# Patient Record
Sex: Female | Born: 1935
Health system: Southern US, Community
[De-identification: ages and names within clinical notes are randomized; demographics above are authoritative.]

## PROBLEM LIST (undated history)

## (undated) DIAGNOSIS — J449 Chronic obstructive pulmonary disease, unspecified: Secondary | ICD-10-CM

## (undated) DIAGNOSIS — I252 Old myocardial infarction: Secondary | ICD-10-CM

## (undated) DIAGNOSIS — F419 Anxiety disorder, unspecified: Secondary | ICD-10-CM

## (undated) DIAGNOSIS — J984 Other disorders of lung: Secondary | ICD-10-CM

## (undated) DIAGNOSIS — R600 Localized edema: Secondary | ICD-10-CM

## (undated) DIAGNOSIS — R0602 Shortness of breath: Secondary | ICD-10-CM

## (undated) DIAGNOSIS — I219 Acute myocardial infarction, unspecified: Secondary | ICD-10-CM

## (undated) DIAGNOSIS — K219 Gastro-esophageal reflux disease without esophagitis: Secondary | ICD-10-CM

## (undated) DIAGNOSIS — N83209 Unspecified ovarian cyst, unspecified side: Secondary | ICD-10-CM

## (undated) DIAGNOSIS — C801 Malignant (primary) neoplasm, unspecified: Secondary | ICD-10-CM

## (undated) DIAGNOSIS — E785 Hyperlipidemia, unspecified: Secondary | ICD-10-CM

## (undated) DIAGNOSIS — Z9221 Personal history of antineoplastic chemotherapy: Secondary | ICD-10-CM

## (undated) DIAGNOSIS — I2510 Atherosclerotic heart disease of native coronary artery without angina pectoris: Secondary | ICD-10-CM

## (undated) HISTORY — DX: Other disorders of lung: J98.4

## (undated) HISTORY — DX: Malignant (primary) neoplasm, unspecified: C80.1

## (undated) HISTORY — DX: Shortness of breath: R06.02

## (undated) HISTORY — DX: Old myocardial infarction: I25.2

## (undated) HISTORY — DX: Unspecified ovarian cyst, unspecified side: N83.209

## (undated) HISTORY — DX: Gastro-esophageal reflux disease without esophagitis: K21.9

## (undated) HISTORY — DX: Hyperlipidemia, unspecified: E78.5

## (undated) HISTORY — PX: TONSILLECTOMY: SUR1361

---

## 2004-01-16 ENCOUNTER — Other Ambulatory Visit: Payer: Self-pay

## 2004-01-16 ENCOUNTER — Inpatient Hospital Stay: Payer: Self-pay | Admitting: Internal Medicine

## 2004-02-04 ENCOUNTER — Ambulatory Visit: Payer: Self-pay | Admitting: Internal Medicine

## 2004-02-14 ENCOUNTER — Ambulatory Visit: Payer: Self-pay | Admitting: Internal Medicine

## 2004-03-13 ENCOUNTER — Ambulatory Visit: Payer: Self-pay

## 2004-03-19 ENCOUNTER — Encounter: Payer: Self-pay | Admitting: Pulmonary Disease

## 2004-03-19 ENCOUNTER — Ambulatory Visit: Payer: Self-pay

## 2004-04-06 ENCOUNTER — Ambulatory Visit (HOSPITAL_COMMUNITY): Admission: RE | Admit: 2004-04-06 | Discharge: 2004-04-07 | Payer: Self-pay | Admitting: Cardiothoracic Surgery

## 2004-04-06 ENCOUNTER — Encounter (INDEPENDENT_AMBULATORY_CARE_PROVIDER_SITE_OTHER): Payer: Self-pay | Admitting: *Deleted

## 2004-04-14 ENCOUNTER — Inpatient Hospital Stay (HOSPITAL_COMMUNITY): Admission: RE | Admit: 2004-04-14 | Discharge: 2004-04-19 | Payer: Self-pay | Admitting: Cardiothoracic Surgery

## 2004-04-14 ENCOUNTER — Encounter (INDEPENDENT_AMBULATORY_CARE_PROVIDER_SITE_OTHER): Payer: Self-pay | Admitting: Specialist

## 2004-05-01 ENCOUNTER — Ambulatory Visit: Payer: Self-pay | Admitting: Oncology

## 2004-05-08 ENCOUNTER — Encounter: Admission: RE | Admit: 2004-05-08 | Discharge: 2004-05-08 | Payer: Self-pay | Admitting: Cardiothoracic Surgery

## 2004-05-16 ENCOUNTER — Ambulatory Visit: Payer: Self-pay | Admitting: Oncology

## 2004-06-15 ENCOUNTER — Ambulatory Visit: Payer: Self-pay | Admitting: Oncology

## 2004-06-19 ENCOUNTER — Encounter: Admission: RE | Admit: 2004-06-19 | Discharge: 2004-06-19 | Payer: Self-pay | Admitting: Cardiothoracic Surgery

## 2004-07-16 ENCOUNTER — Ambulatory Visit: Payer: Self-pay | Admitting: Oncology

## 2004-08-15 ENCOUNTER — Ambulatory Visit: Payer: Self-pay | Admitting: Oncology

## 2004-09-15 ENCOUNTER — Ambulatory Visit: Payer: Self-pay | Admitting: Oncology

## 2004-12-14 ENCOUNTER — Ambulatory Visit: Payer: Self-pay | Admitting: Oncology

## 2004-12-16 ENCOUNTER — Ambulatory Visit: Payer: Self-pay | Admitting: Oncology

## 2005-02-15 HISTORY — PX: LUNG SURGERY: SHX703

## 2005-03-05 ENCOUNTER — Ambulatory Visit: Payer: Self-pay | Admitting: Oncology

## 2005-03-09 ENCOUNTER — Ambulatory Visit: Payer: Self-pay

## 2005-03-10 ENCOUNTER — Ambulatory Visit: Payer: Self-pay | Admitting: Oncology

## 2005-06-08 ENCOUNTER — Ambulatory Visit: Payer: Self-pay | Admitting: Oncology

## 2005-06-15 ENCOUNTER — Ambulatory Visit: Payer: Self-pay | Admitting: Oncology

## 2005-09-10 ENCOUNTER — Ambulatory Visit: Payer: Self-pay | Admitting: Oncology

## 2005-09-14 ENCOUNTER — Ambulatory Visit: Payer: Self-pay | Admitting: Oncology

## 2005-12-14 ENCOUNTER — Ambulatory Visit: Payer: Self-pay | Admitting: Oncology

## 2005-12-16 ENCOUNTER — Ambulatory Visit: Payer: Self-pay | Admitting: Oncology

## 2006-03-04 ENCOUNTER — Ambulatory Visit: Payer: Self-pay | Admitting: Oncology

## 2006-03-09 ENCOUNTER — Ambulatory Visit: Payer: Self-pay | Admitting: Oncology

## 2006-03-24 ENCOUNTER — Ambulatory Visit: Payer: Self-pay | Admitting: Oncology

## 2006-03-29 ENCOUNTER — Ambulatory Visit: Payer: Self-pay | Admitting: Gastroenterology

## 2006-06-16 ENCOUNTER — Ambulatory Visit: Payer: Self-pay | Admitting: Oncology

## 2006-07-07 ENCOUNTER — Ambulatory Visit: Payer: Self-pay | Admitting: Oncology

## 2006-07-17 ENCOUNTER — Ambulatory Visit: Payer: Self-pay | Admitting: Oncology

## 2007-01-11 ENCOUNTER — Ambulatory Visit: Payer: Self-pay | Admitting: Oncology

## 2007-01-13 IMAGING — CR DG CHEST 2V
2 series · 2 of 2 positions shown · non-contrast
Comparison: 04/18/2004

CLINICAL DATA: Lung lesion, status post surgery

CHEST - 2 VIEW:

[view not recorded (1 of 2)]
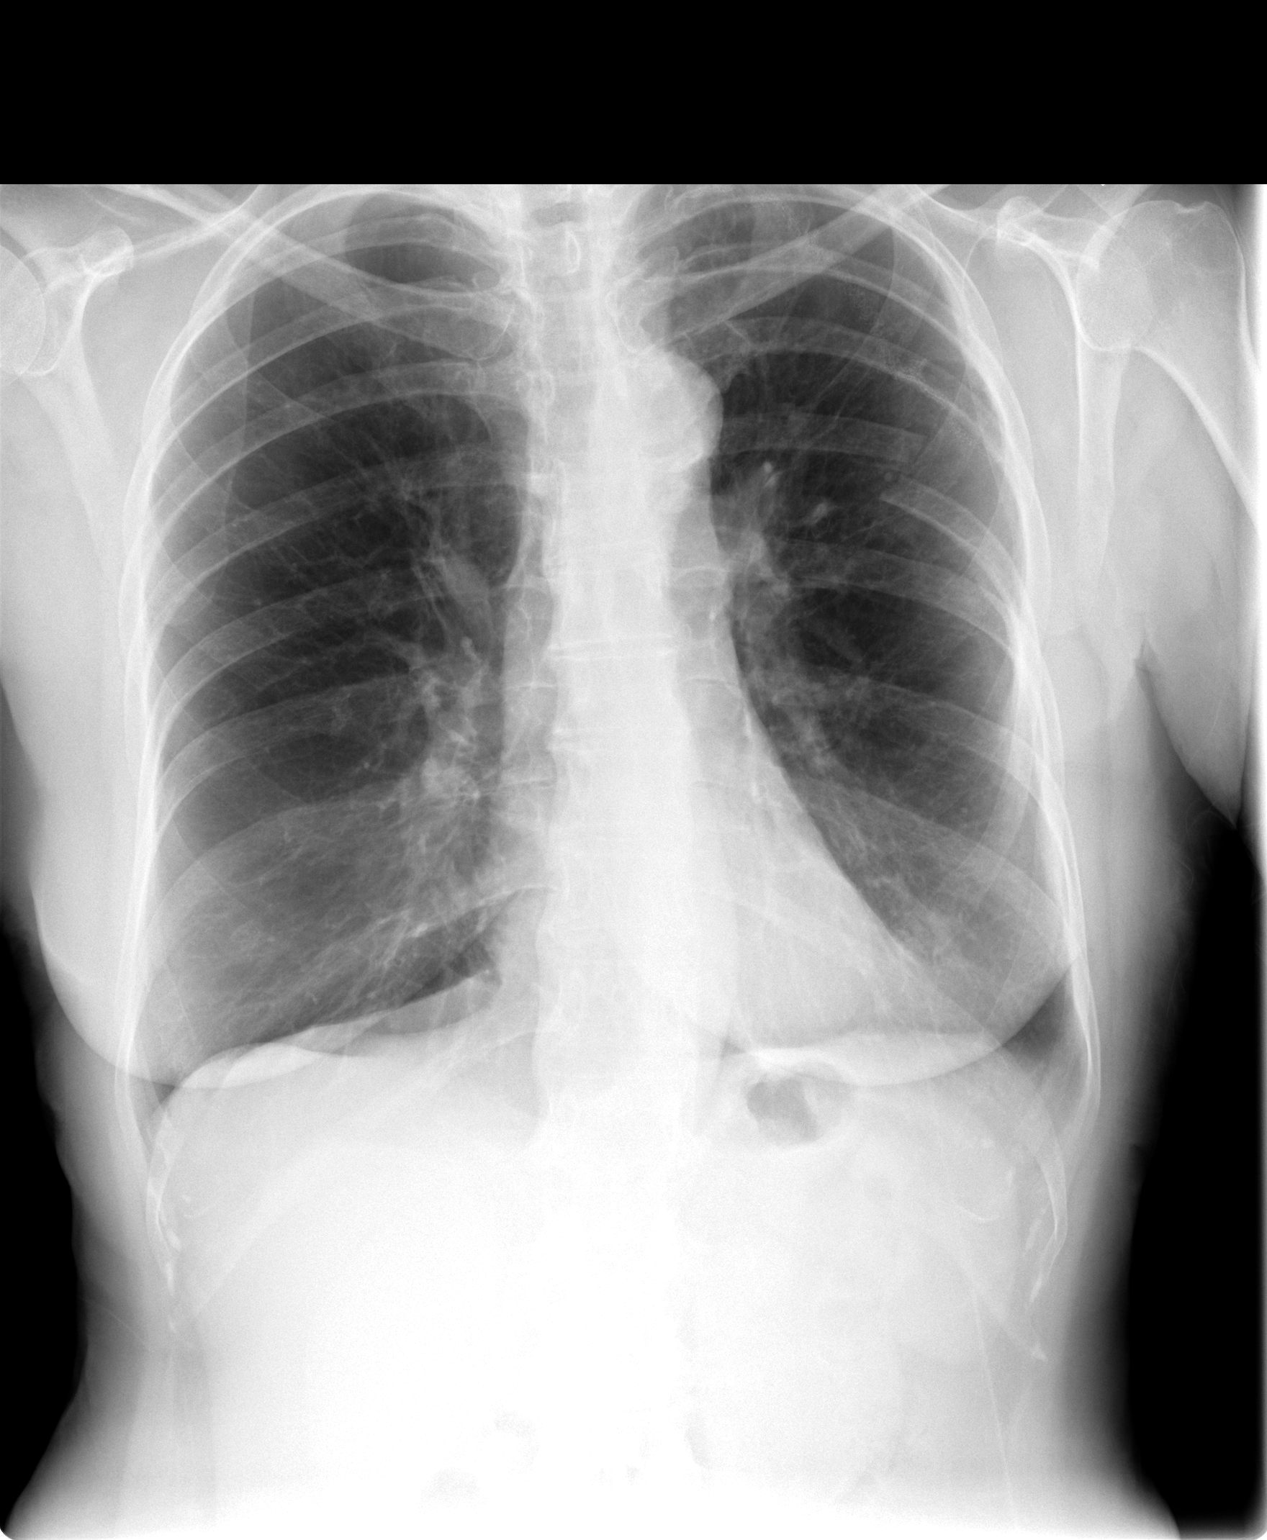

[view not recorded (2 of 2)]
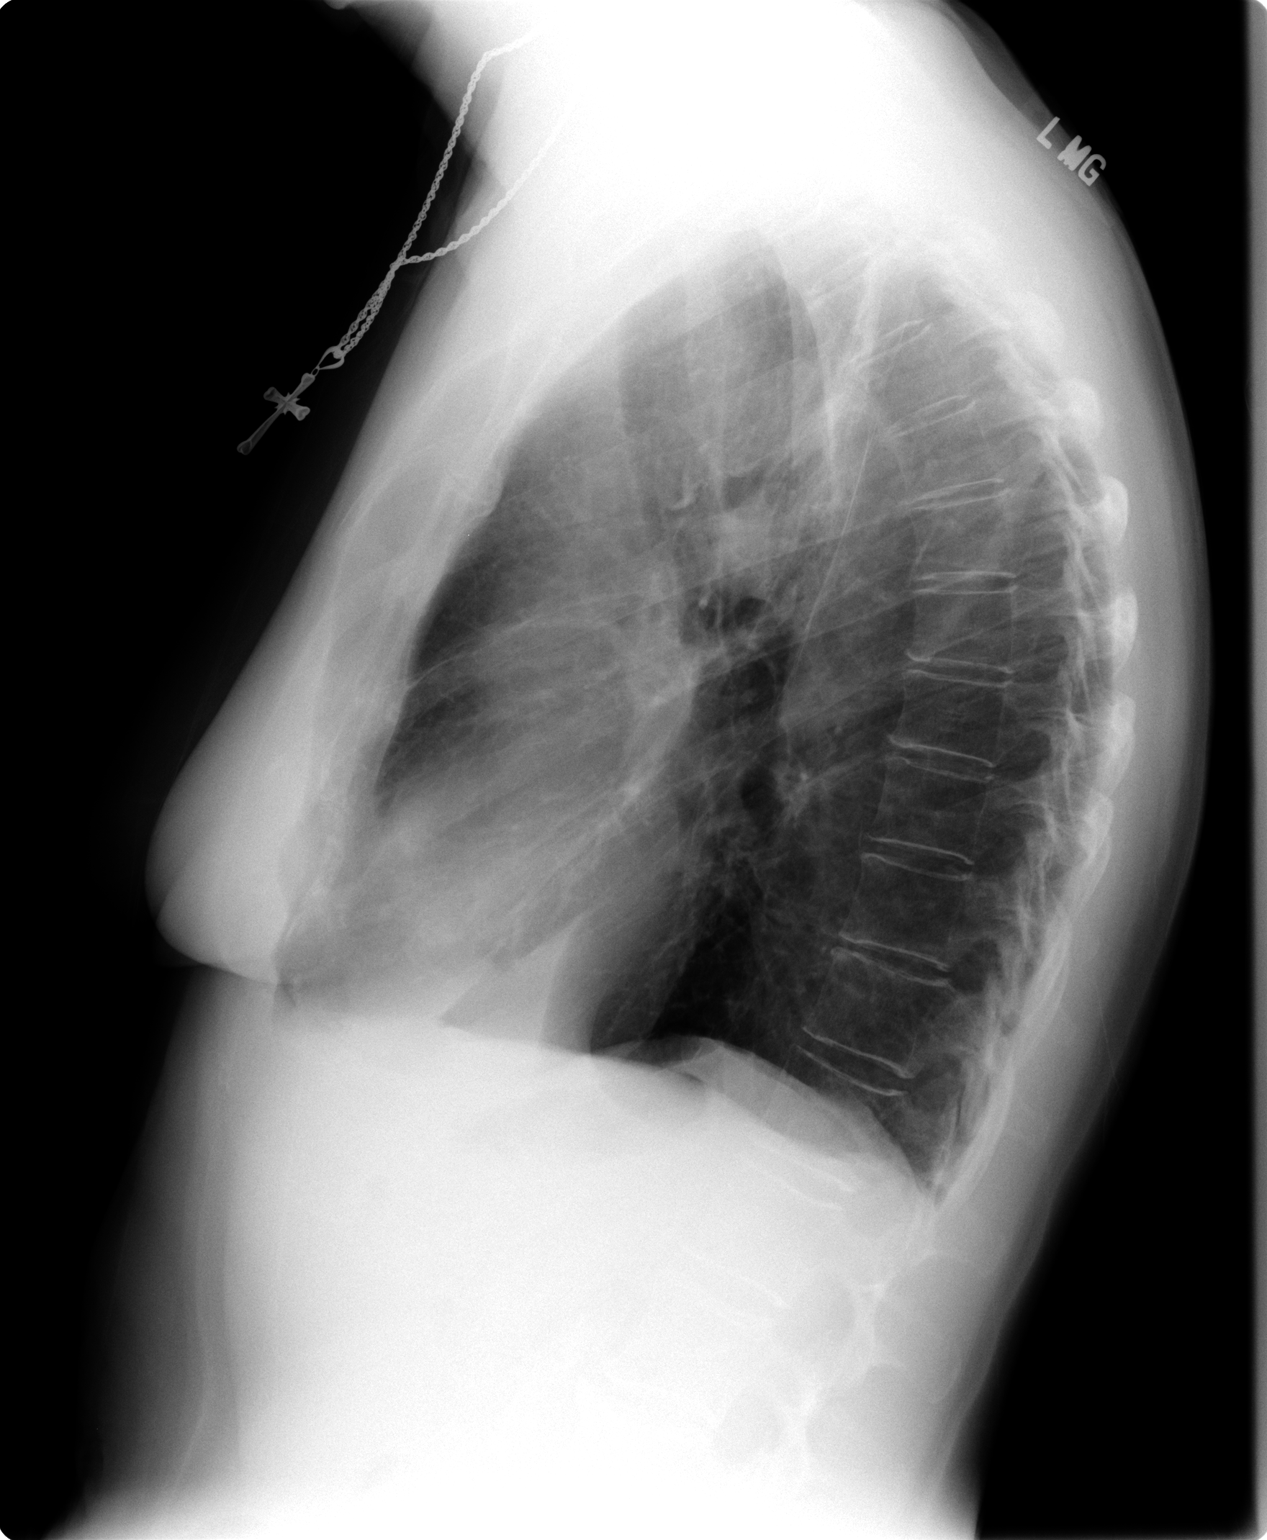

[2 of 2 positions shown; findings below may reference images not displayed]

FINDINGS: Postoperative changes noted on the left. Previously described small
left apical pneumothorax no longer visualized. Left effusion has decreased. COPD
changes present.
IMPRESSION: Resolution of left hydropneumothorax.

COPD

## 2007-01-16 ENCOUNTER — Ambulatory Visit: Payer: Self-pay | Admitting: Oncology

## 2007-02-16 ENCOUNTER — Ambulatory Visit: Payer: Self-pay | Admitting: Oncology

## 2007-03-27 ENCOUNTER — Ambulatory Visit: Payer: Self-pay | Admitting: Oncology

## 2007-04-17 ENCOUNTER — Ambulatory Visit: Payer: Self-pay | Admitting: Oncology

## 2007-04-19 ENCOUNTER — Ambulatory Visit: Payer: Self-pay | Admitting: Oncology

## 2007-04-20 ENCOUNTER — Ambulatory Visit: Payer: Self-pay | Admitting: Oncology

## 2007-04-25 ENCOUNTER — Encounter: Payer: Self-pay | Admitting: Pulmonary Disease

## 2007-04-27 ENCOUNTER — Encounter: Payer: Self-pay | Admitting: Pulmonary Disease

## 2007-05-01 ENCOUNTER — Encounter: Payer: Self-pay | Admitting: Pulmonary Disease

## 2007-05-17 ENCOUNTER — Ambulatory Visit: Payer: Self-pay | Admitting: Oncology

## 2007-06-01 ENCOUNTER — Ambulatory Visit: Payer: Self-pay | Admitting: Pulmonary Disease

## 2007-06-01 DIAGNOSIS — J309 Allergic rhinitis, unspecified: Secondary | ICD-10-CM | POA: Insufficient documentation

## 2007-06-01 DIAGNOSIS — R0602 Shortness of breath: Secondary | ICD-10-CM | POA: Insufficient documentation

## 2007-06-01 DIAGNOSIS — J45909 Unspecified asthma, uncomplicated: Secondary | ICD-10-CM | POA: Insufficient documentation

## 2007-06-01 DIAGNOSIS — J438 Other emphysema: Secondary | ICD-10-CM | POA: Insufficient documentation

## 2007-06-15 ENCOUNTER — Ambulatory Visit: Payer: Self-pay | Admitting: Pulmonary Disease

## 2007-06-16 ENCOUNTER — Ambulatory Visit: Payer: Self-pay | Admitting: Oncology

## 2007-07-05 ENCOUNTER — Encounter: Payer: Self-pay | Admitting: Pulmonary Disease

## 2007-07-17 ENCOUNTER — Ambulatory Visit: Payer: Self-pay | Admitting: Oncology

## 2007-08-03 ENCOUNTER — Ambulatory Visit: Payer: Self-pay | Admitting: Oncology

## 2007-08-16 ENCOUNTER — Ambulatory Visit: Payer: Self-pay | Admitting: Oncology

## 2007-10-17 ENCOUNTER — Ambulatory Visit: Payer: Self-pay | Admitting: Oncology

## 2007-11-01 ENCOUNTER — Ambulatory Visit: Payer: Self-pay | Admitting: Oncology

## 2007-11-03 ENCOUNTER — Ambulatory Visit: Payer: Self-pay | Admitting: Oncology

## 2007-11-16 ENCOUNTER — Ambulatory Visit: Payer: Self-pay | Admitting: Oncology

## 2007-11-16 ENCOUNTER — Inpatient Hospital Stay: Payer: Self-pay | Admitting: Internal Medicine

## 2007-11-16 ENCOUNTER — Other Ambulatory Visit: Payer: Self-pay

## 2007-11-18 ENCOUNTER — Inpatient Hospital Stay (HOSPITAL_COMMUNITY): Admission: AD | Admit: 2007-11-18 | Discharge: 2007-11-21 | Payer: Self-pay | Admitting: Cardiology

## 2007-11-18 ENCOUNTER — Ambulatory Visit: Payer: Self-pay | Admitting: Emergency Medicine

## 2007-12-17 ENCOUNTER — Ambulatory Visit: Payer: Self-pay | Admitting: Oncology

## 2008-02-16 ENCOUNTER — Ambulatory Visit: Payer: Self-pay | Admitting: Oncology

## 2008-02-26 ENCOUNTER — Ambulatory Visit: Payer: Self-pay | Admitting: Oncology

## 2008-03-18 ENCOUNTER — Ambulatory Visit: Payer: Self-pay | Admitting: Oncology

## 2008-04-05 ENCOUNTER — Ambulatory Visit: Payer: Self-pay | Admitting: Internal Medicine

## 2008-04-15 ENCOUNTER — Ambulatory Visit: Payer: Self-pay | Admitting: Oncology

## 2008-06-15 ENCOUNTER — Ambulatory Visit: Payer: Self-pay | Admitting: Oncology

## 2008-07-09 ENCOUNTER — Ambulatory Visit: Payer: Self-pay | Admitting: Internal Medicine

## 2008-07-12 ENCOUNTER — Ambulatory Visit: Payer: Self-pay | Admitting: Oncology

## 2008-07-16 ENCOUNTER — Ambulatory Visit: Payer: Self-pay | Admitting: Oncology

## 2008-08-15 ENCOUNTER — Ambulatory Visit: Payer: Self-pay | Admitting: Oncology

## 2008-09-15 ENCOUNTER — Ambulatory Visit: Payer: Self-pay | Admitting: Oncology

## 2009-01-20 ENCOUNTER — Ambulatory Visit: Payer: Self-pay | Admitting: Oncology

## 2009-02-15 ENCOUNTER — Ambulatory Visit: Payer: Self-pay | Admitting: Oncology

## 2009-03-18 ENCOUNTER — Ambulatory Visit: Payer: Self-pay | Admitting: Oncology

## 2009-04-15 ENCOUNTER — Ambulatory Visit: Payer: Self-pay | Admitting: Oncology

## 2009-06-24 ENCOUNTER — Ambulatory Visit: Payer: Self-pay | Admitting: Obstetrics and Gynecology

## 2009-08-15 ENCOUNTER — Ambulatory Visit: Payer: Self-pay | Admitting: Oncology

## 2009-09-11 ENCOUNTER — Ambulatory Visit: Payer: Self-pay | Admitting: Oncology

## 2010-02-17 ENCOUNTER — Emergency Department: Payer: Self-pay | Admitting: Emergency Medicine

## 2010-03-09 ENCOUNTER — Ambulatory Visit: Payer: Self-pay | Admitting: Gastroenterology

## 2010-03-11 LAB — PATHOLOGY REPORT

## 2010-03-16 ENCOUNTER — Ambulatory Visit: Payer: Self-pay | Admitting: Oncology

## 2010-03-18 ENCOUNTER — Ambulatory Visit: Payer: Self-pay | Admitting: Oncology

## 2010-03-18 LAB — CA 125: CA 125: 20 U/mL (ref 0.0–34.0)

## 2010-04-16 ENCOUNTER — Ambulatory Visit: Payer: Self-pay | Admitting: Oncology

## 2010-05-17 ENCOUNTER — Ambulatory Visit: Payer: Self-pay | Admitting: Gynecologic Oncology

## 2010-05-17 ENCOUNTER — Ambulatory Visit: Payer: Self-pay | Admitting: Oncology

## 2010-05-27 LAB — CA 125: CA 125: 17 U/mL (ref 0.0–34.0)

## 2010-06-16 ENCOUNTER — Ambulatory Visit: Payer: Self-pay | Admitting: Oncology

## 2010-06-30 NOTE — Discharge Summary (Signed)
NAMEYANIQUE, MULVIHILL               ACCOUNT NO.:  [id number]   MEDICAL RECORD NO.:  [id number]          PATIENT TYPE:  INP   LOCATION:  3709                         FACILITY:  [facility]   PHYSICIAN:  Georga Hacking, M.D.DATE OF BIRTH:  [birth year]   DATE OF ADMISSION:  11/18/2007  DATE OF DISCHARGE:  11/21/2007                               DISCHARGE SUMMARY   FINAL DIAGNOSES:  1. Respiratory distress, acute, requiring intubation.  2. Mild elevation of myocardial band enzymes of creatine phosphokinase      and troponin, likely due to band ischemia, only mild coronary      disease noted at catheterization.  3. History of stage III lung cancer.  4. Previous history of transient ischemic attack.  5. Hyperlipidemia, under treatment.  6. Anxiety and depression.  7. Severe underlying chronic obstructive pulmonary disease.   PROCEDURES:  Cardiac catheterization.   HISTORY:  This 77 year old female has a history of stage III lung cancer  and underlying COPD.  She has no previous cardiac history.  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  She was taken to [facility] and was intubated on the ventilator overnight and was  taken off the next morning.  She did relatively well following this, but  had elevation of CPK and MB and was brought to the [facility]  at her request for further care.  Please see the dictated history and  physical for remainder of the details.   HOSPITAL COURSE:  Lab done on transfer showed a hemoglobin of 10.4,  hematocrit of 30.4, white count of 13,800.  Glucose is 142, BUN 13,  creatinine 0.65, sodium 142, potassium 3.6, chloride 110, and CO2 24.  EKG showed some T-wave inversions in V2 through V4.  The patient was  admitted to the hospital and PT and PTT were normal.  She was originally  admitted to Dr. Cathren Laine service, but since he was not a preferred  [place] [place] in their network, she wished to be seen and have her  catheterization done by me.  Repeat CPK-MB was 10 with a total CPK of  155.  Troponin was 0.20, B-natriuretic peptide was 702.  Echocardiogram  done at [facility] reportedly showed normal systolic and right  ventricular function.  PT and PTT were normal.  The patient was placed  on Lovenox over the weekend, received some IV fluids because of some  mild hypotension.  D-dimer evidently was unremarkable.  She was taken to  the cardiac catheterization laboratory on November 20, 2007.  She had  calcifications of the LAD with mild 30%-40% proximal stenosis.  Circumflex was a dominant vessel and contained no significant stenosis.  Right coronary artery was nondominant.  LV function was preserved.  Results were discussed with the patient and it was felt that her  clinical presentation was most likely that of demand cardiac ischemia.  This was likely due to underlying  COPD exacerbation.  I asked the  Pulmonary physicians to see her, but they had not seen her at the time  of the dictated discharge note for further recommendations.  She is  discharged at this time in improved condition on aspirin 81 mg daily,  Advair 250/50, 1 puff b.i.d., Spiriva b.i.d., multivitamins daily,  alprazolam 0.5 mg at bedtime, Zantac 150 mg twice a day, Zoloft 50 mg  daily, Pravachol 40 mg daily, albuterol inhaler as needed.  In  addition, she was given a prescription for Plavix 75 mg daily for 1  month.  She is to follow up with Dr. Roger Shelter and Dr. Judithann Sheen and I will  see her on an as-needed basis.  Her catheterization site was evaluated  and checked and was fine the next morning.  She is to call if there are  further problems or recurrent symptoms.      Georga Hacking, M.D.  Electronically Signed     WST/MEDQ  D:  11/21/2007  T:  11/21/2007  Job:  [id number]   cc:    Aram Beecham, MD  Kerin Perna, M.D.

## 2010-06-30 NOTE — H&P (Signed)
[name], [name]               ACCOUNT NO.:  [id number]   MEDICAL RECORD NO.:  [id number]           PATIENT TYPE:   LOCATION:                                 FACILITY:   PHYSICIAN:  Georga Hacking, M.D.DATE OF BIRTH:  March 23, 1933   DATE OF ADMISSION:  11/18/2007  DATE OF DISCHARGE:                              HISTORY & PHYSICAL   REASON FOR ADMISSION:  Transfer from hospital for consideration of  catheterization.   HISTORY:  The patient is a 77 year old female who has a previous history  of lung cancer.  She had a left upper lobe resection for stage III lung  cancer done by Dr. Kathlee Nations Trigt in February of 2006.  At that time  she had two positive nodes and this was felt to be non-small-cell cancer  and she underwent chemotherapy.  Evidently a recent PET scan showed some  possible increase in the amount of mediastinal region in March and  evidently has had a CT scan showing chronic changes with no interval  change since 2007.  She had had worsening dyspnea and was seen by Dr.  Peter Swaziland in March.  She had a negative adenosine Cardiolite study at  that time and an echocardiogram that showed diastolic dysfunction but  normal systolic function.  She was seen by Dr. Marcelyn Bruins and Spiriva  was added to her medical regimen.  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  She used her emergency inhaler and then called the  emergency medical services and was in respiratory distress when they  arrived.  She was given Solu-Medrol, epinephrine and was intubated at  [facility].  She was intubated on November 16, 2007,  and was extubated yesterday on October 2.  Her MB rose to 10.3 and her  troponin rose to 1.6.  She did not evolve in an MI and had a negative  echocardiogram.  She may have had some indigestion but no significant  chest pain associated with it.  I received a  call from Dr. Elijah Birk who  stated that the patient wished to be transferred here for further  evaluation.  The patient had some mild hypotension requiring Levophed.  She denies recent upper respiratory infection or illness.   PAST MEDICAL HISTORY:  Is remarkable for stage III lung cancer, previous  history of TIA, hyperlipidemia and previous history of ischemic bowel.  She has a history of some anxiety.   PAST SURGICAL HISTORY:  Left upper lobectomy, tonsillectomy, history for  small bowel surgery.   ALLERGIES:  SULFA and TETANUS.   CURRENT MEDICATIONS:  1. Pravastatin 40 mg daily.  2. Zoloft 50 mg daily.  3. Spiriva 18 mcg daily.  4. Ranitidine 150 b.i.d.  5. Aspirin 81 mg daily.  6. Multivitamins daily.  7. Calcium with D 1000 mg daily.  8. Advair Diskus 250/50 b.i.d.  9. Lorazepam 1 mg q.h.s.   SOCIAL HISTORY:  Retired from [employer].  Quit smoking in 2003 but  had  a 40 pack year history of smoking.  Drinks occasional wine.  Has one  daughter.   FAMILY HISTORY:  Father died age 37 of heart failure.  Mother died age  58 with stroke.  Four siblings.  One brother has had cardiac disease.   REVIEW OF SYSTEMS:  No recent weight loss.  She has no eye, ear, nose or  throat complaints.  She has no difficulty swallowing.  Does have a  history of some mild dyspepsia.  She has severe COPD on pulmonary  function testing by Dr. Shelle Iron done earlier this year.  She has not had  any recent change in her bowel movements.  Currently has an indwelling  Foley catheter.  She does have a previous history of TIA but no history  of stroke or TIA recently.   PHYSICAL EXAMINATION:  GENERAL:  She is a very pleasant elderly female  appearing older than her stated age.  VITAL SIGNS:  Blood pressure is currently 90/60.  SKIN:  Skin is warm and dry.  HEENT:  PERRLA.  EOMI.  C and S clear.  Fundi not examined.  Pharynx  negative.  NECK:  Supple.  No masses, JVD, thyromegaly or bruits.  LUNGS:   Clear to A and P.  Healed thoracotomy scar on the left side.  There are no rales noted.  CARDIOVASCULAR:  Exam showed normal S1, S2.  No S3 or murmur.  ABDOMEN:  Abdomen is soft and nontender.  Pulses were present at 2+.  There is no peripheral edema noted.   EKG is not available for review.  Recent laboratory data showed today a  white count of 13,800, hemoglobin of 10.4, hematocrit of 30.4, platelet  count of 263,000.  Sodium is 142, potassium 3.6, chloride 110, CO2 24,  BUN 13, creatinine 0.65, glucose of 142.  Blood gas yesterday showed a  pH of 7.38, a pCO2 of 41, a pO2 of 153.  CPK rose to 189 which was  normal but with an MB of 10.7, troponin rose to 1.6 from normal.  B  natriuretic peptide yesterday was 1480.   IMPRESSION:  1. Recent exacerbation of COPD (chronic obstructive pulmonary disease)      with intubation and mechanical ventilation.  2. Abnormal cardiovascular enzymes that could reflect demand ischemia      versus a subendocardial infarction.  3. Elevated BNP (beta natriuretic peptide) level may reflect      hypertensive heart disease and diastolic dysfunction versus acute      decompensation.  4. History of stage III lung cancer with previous resection and      chemotherapy.  5. Severe underlying chronic obstructive pulmonary disease.  6. Hyperlipidemia under treatment.  7. Anxiety.  8. History of reflux.   RECOMMENDATIONS:  The patient will be brought into the hospital.  She  will be placed on Plavix because of the abnormal enzymes.  She has  severe COPD and would be an increased candidate for surgical risk.  I  think she will need to have cardiac catheterization to clarify the  abnormal cardiac enzymes in view of the rapid onset of clinical  presentation.  The cardiac catheterization procedure was discussed with  the patient fully including the risks of myocardial infarction, death,  stroke, bleeding, arrhythmia, dye allergy or renal failure.  She  understands  and is willing to proceed.      Georga Hacking, M.D.  Electronically Signed     WST/MEDQ  D:  11/18/2007  T:  11/18/2007  Job:  [id number]   cc:   Peter M. Swaziland, M.D.  Dr Osborne Oman  Dr Roger Shelter

## 2010-06-30 NOTE — Cardiovascular Report (Signed)
NAMEPANSIE, GUGGISBERG               ACCOUNT NO.:  [id number]   MEDICAL RECORD NO.:  [id number]          PATIENT TYPE:  INP   LOCATION:  3709                         FACILITY:  [facility]   PHYSICIAN:  Georga Hacking, M.D.DATE OF BIRTH:  April 06, 1933   DATE OF PROCEDURE:  11/20/2007  DATE OF DISCHARGE:                            CARDIAC CATHETERIZATION   HISTORY:  The patient is a 77 year old female with significant  underlying lung disease who was admitted with acute respiratory failure  to [facility].  She was intubated overnight and then was  extubated.  She had elevation of troponin and a mild elevation to CPK-MB  and reportedly with normal LV systolic function.  He has been mildly  hypotensive through the weekend.   PROCEDURE:  Left heart catheterization with coronary angiograms and left  ventriculogram.   PROCEDURE DETAILS:  The patient tolerated the procedure well without  complications.  It was done through the right femoral artery using a  single anterior needle wall stick.  The sheath was removed in the  holding area and she tolerated it well in stable condition.   HEMODYNAMIC DATA:  Aorta postcontrast 114/56.  LV postcontrast 114/10-  14.   ANGIOGRAPHIC DATA:  Left ventriculogram:  Performed in the 30-degree RAO  projection.  The aortic valve was normal.  The mitral valve was normal.  There was very minimal anterior wall hypokinesis noted.  Estimated  ejection fraction was 60%.   Coronary arteries:  Arise and distribute normally.  There is  calcification noted involving the left coronary system.  The vessel is  left dominant.  The left main coronary artery is normal.  The left  anterior descending has segmental calcification in the proximal and  midportions with a segmental 30-40% proximal narrowing.  This vessel is  tortuous with mild irregularity noted but no significant obstructive  stenoses were noted.  The circumflex is a dominant vessel with a  marginal branch and intermediate branch and a large posterior descending  branch which contained only scattered irregularities.  The right  coronary artery is a congenitally small nondominant vessel supplying  mainly right ventricular branches.   IMPRESSION:  1. Mild-to-moderate disease involving the proximal left anterior      descending which is calcified with no significant focal obstructive      stenoses noted.  Mild irregularities in other vessels and left      dominant vessels.  2. Preserved left ventricular function with possible minimal anterior      wall hypokinesis.   RECOMMENDATIONS:  The patient likely had demand ischemia secondary to  diastolic dysfunction as well as her acute respiratory event.  She had a  recent echocardiogram that reportedly showed normal right and left  ventricular systolic function.  She will be continued to be treated with  simvastatin and we will treat her with Plavix for the time-being.  Anticipate early discharge.      Georga Hacking, M.D.  Electronically Signed     WST/MEDQ  D:  11/20/2007  T:  11/21/2007  Job:  [id number]   cc:   Aram Beecham, M.D.

## 2010-07-03 NOTE — Discharge Summary (Signed)
[name], [name]               ACCOUNT NO.:  [id number]   MEDICAL RECORD NO.:  [id number]          PATIENT TYPE:  INP   LOCATION:  2002                         FACILITY:  [facility]   PHYSICIAN:  Kerin Perna, M.D.  DATE OF BIRTH:  1933/09/21   DATE OF ADMISSION:  04/14/2004  DATE OF DISCHARGE:  04/19/2004                                 DISCHARGE SUMMARY   HISTORY OF PRESENT ILLNESS:  The patient is a 77 year old ex-smoker (quit  two years ago) who was found to have an enlarged left upper lobe nodule on  serial CT scans measuring 1.5 cm with spiculated margins.  There was no  definite abnormal mediastinal adenopathy on CT scan.  A PET scan showed  hypermetabolic activity in the left upper lobe nodule as well as increased  activity in the left hilum.  There was no other evidence of suspicious  metastatic disease in the liver or adrenal glands.  Bronchoscopy was  negative.  She underwent mediastinoscopy with biopsies obtained for superior  mediastinal lymph nodes and these were negative for tumor.  She was seen in  consultation by Dr. Donata Clay and was scheduled for left video assisted  thoracoscopy with pulmonary resection, mediastinal lymph node dissection.  She was admitted this hospitalization for the procedure.   PAST MEDICAL HISTORY:  1.  COPD/asthma.  2.  Left upper lobe nodule with possible hilar involvement.  3.  History of lower GI bleeding probably ischemic colitis resolved without      surgery.  4.  Anxiety disorder.  5.  Osteoarthritis.  6.  Gastroesophageal reflux.   MEDICATIONS PRIOR TO ADMISSION:  1.  Advair Discus 100/50 1 puff b.i.d.  2.  Aspirin 81 mg daily.  3.  Centrum one daily.  4.  Mevacor 40 mg q.h.s.  5.  Xanax 0.25 mg q. h.s.  6.  Zantac 75 mg p.r.n. heartburn.   ALLERGIES:  Sulfa.   For family history, social history, review of systems, and physical exam,  please see the history and physical done at the time of admission.   HOSPITAL COURSE:   The patient was admitted electively on April 14, 2004,  at which time she was taken to the operating room and underwent a left video  assisted thoracoscopy with mini-thoracotomy and left upper lobe wedge  resection with lymph node sampling.  The patient tolerated the procedure  well and was taken to the post anesthesia care unit in stable condition.  Postoperative hospital course, the patient did well.  She remained  hemodynamically stable.  All routine lines, monitors, and drainage devices  were discontinued in a standard fashion.  She progressed well in regards to  increasing activities with pulmonary toilet.  She did have some difficulty  with pain but this showed steady improvement.  Her oxygenation improved and  she was weaned to room air.  Her incision showed good evidence of healing  without signs of infection.  Pathology revealed the left upper lobe wedge  resection showed invasive moderately differentiated adenocarcinoma, the  visceral, pleural, and parenchymal margins were free of tumor, lymph node  level 4L biopsies showed metastatic adenocarcinoma on two separate  specimens.  She was characterized [id number] per the pathology report.  Her  overall status was deemed acceptable for discharge on April 19, 2004.   MEDICATIONS ON DISCHARGE:  As prior to admission.  For pain, Tylox 1-2 every  4-6 hours as needed.   DISCHARGE INSTRUCTIONS:  The patient received written instructions regarding  medications, activity, diet, wound care, and follow up.  Follow up with Dr.  Donata Clay on Friday, March 24, at 11:45 a.m.   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      WEG/MEDQ  D:  06/16/2004  T:  06/16/2004  Job:  [id number]

## 2010-07-03 NOTE — Op Note (Signed)
[name], [name]               ACCOUNT NO.:  [id number]   MEDICAL RECORD NO.:  [id number]          PATIENT TYPE:  OIB   LOCATION:  2899                         FACILITY:  [facility]   PHYSICIAN:  Kathlee Nations Trigt III, M.D.DATE OF BIRTH:  1933-06-02   DATE OF PROCEDURE:  04/06/2004  DATE OF DISCHARGE:                                 OPERATIVE REPORT   OPERATION:  1.  Flexible bronchoscopy.  2.  Mediastinoscopy.   PREOPERATIVE DIAGNOSIS:  Left upper lobe mass with hypermetabolic activity  consistent with bronchogenic carcinoma and hyperactive left hyaline lymph  node on PET scan.   POSTOPERATIVE DIAGNOSIS:  Left upper lobe mass with hypermetabolic activity  consistent with bronchogenic carcinoma and hyperactive left hyaline lymph  node on PET scan.   SURGEON:  Kerin Perna, M.D.   ANESTHESIA:  General.   INDICATIONS:  The patient is a 77 year old female smoker with COPD who was  recently found to have a left upper lobe nodule and associated left hilar  mass both of which were hypermetabolic on a PET scan. A cytologic diagnosis  had not been obtained. To stage the patient prior to thoracotomy,  bronchoscopy and mediastinoscopy was recommended as she has a low a FEV-1  and surgical excision - biopsy would be at some risk. I reviewed the  procedure with the patient and her family of bronchoscopy and  mediastinoscopy and reviewed the location of the incision, the use of  general anesthesia, and expected postoperative recovery.  I reviewed the  risks to the patient including the risks of pneumothorax, bleeding,  pneumonia, and death. The patient agreed to proceed with operation under  what I felt was an informed consent.   PROCEDURE:  The patient was brought to operating room and placed supine on  the operating table where general anesthesia was induced. Through the  endotracheal tube a fiberoptic flexible bronchoscope was placed. The  bronchoscope was passed to the distal  trachea which was normal. The carina  was sharp without mucosal abnormalities. The bronchoscope was passed on the  left mainstem bronchus. There were no mucosal abnormalities or lesions. The  bronchoscope was used to examine the bronchial orifices of the left upper  lobe and left lower lobe bronchial orifices. All showed no evidence of  endobronchial lesion or mucosal abnormality. The bronchoscope was then  withdrawn to the carina and passed to the right mainstem bronchus. This  showed no abnormalities. It should be noted that cytology washings were  taken from the left lung. The bronchoscope was used to examine the bronchial  orifices of the right upper, right middle, and right lower lobe segmental  orifices. All were without significant mucosal abnormality or endobronchial  lesion. The bronchoscope was then withdrawn.   The patient was then positioned and the neck and anterior chest were prepped  and draped as a sterile field. A small suprasternal notch incision was made.  Using electrocautery, dissection was taken down to the pretracheal plane.  The pretracheal plane was bluntly dissected. The mediastinoscope was then  inserted and passed anterior to the trachea underneath the innominate  vessels. There was some left paratracheal fibroareolar tissue which was  excised and sent for pathologic examination. The mediastinoscope was passed  down close to the carina and a station 4L lymph node was identified and  dissected from surrounding tissues and biopsied and removed for pathologic  examination. There is no significant bleeding and electrocautery was used to  obtain adequate hemostasis. The patient had no visible lymph nodes on the  right side of the trachea and the mediastinoscope was removed.   The incision was then closed in layers using interrupted 3-0 Vicryl to close  the strap muscles and the platysma. The subcutaneous layer was closed in  running 2-0 Vicryl and skin was closed  with running 3-0 Vicryl. Sterile  dressing was applied. The patient was extubated and returned to recovery  room.      PV/MEDQ  D:  04/06/2004  T:  04/06/2004  Job:  [id number]

## 2010-07-03 NOTE — Op Note (Signed)
[name], [name]               ACCOUNT NO.:  [id number]   MEDICAL RECORD NO.:  [id number]          PATIENT TYPE:  INP   LOCATION:  2899                         FACILITY:  [facility]   PHYSICIAN:  Kathlee Nations Trigt III, M.D.DATE OF BIRTH:  [birth year]   DATE OF PROCEDURE:  04/14/2004  DATE OF DISCHARGE:                                 OPERATIVE REPORT   OPERATION:  1.  Left VATS (video assisted thoracoscopic surgery).  2.  Left thoracotomy, left upper lobe wedge resection, mediastinal lymph      node dissection.   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   POSTOPERATIVE DIAGNOSIS:  Nonsmall cell carcinoma of the left upper lobe  with mediastinal adenopathy.   SURGEON:  Kerin Perna, M.D.   ASSISTANT:  Sheliah Plane, MD and Rowe Clack, P.A.-C.   ANESTHESIA:  General.   INDICATIONS:  The patient is a 78 year old ex-smoker who was found to have a  left upper lobe nodule on routine chest x-ray. Follow-up CT scan and PET  scan showed this to be suspicious for carcinoma. She also had mediastinal  hypermetabolic activity on the PET scan. Bronchoscopy and mediastinoscopy  were unable to provide a diagnosis and left VATS - thoracotomy was felt to  be indicated for resection of the 2 cm nodule and mediastinal lymph node  dissection. The patient had severe COPD and a lobectomy was not felt to be  tolerated.   Prior to surgery I examined the patient in the office and I reviewed the  indications and expected benefits of left thoracotomy, pulmonary section,  mediastinal lymph node dissection. We discussed alternatives to surgical  therapy including needle biopsy and nonsurgical therapy of her presumed  bronchogenic carcinoma. I discussed the major aspects of planned procedure  including the use of general anesthesia, location of the surgical incisions,  the use of postoperative chest tube drainage, and the expected  postoperative  recovery.  I also reviewed with the patient the risks, including the risk of  incisional pain, infection, pneumothorax, prolonged air leak, bleeding, and  death. She understood these implications for the surgery, the alternatives  and agreed to proceed with the operation as planned under what I felt was an  informed consent.   OPERATIVE FINDINGS:  The patient had a firm nodule at the posterolateral  aspect of the left upper lobe with scarring and an indentation of the  visceral pleura. There is no parietal pleural involvement. There were large  lymph nodes in the AP window station and these were removed both anterior  and posterior to the main pulmonary artery. Frozen section on the wedge  resection of lung returned non-small cell carcinoma, adenocarcinoma. The On-  Q system was used for incisional pain control.   PROCEDURE:  The patient was brought to operating room and placed supine on  the operating table where general anesthesia was used. A double-lumen  endotracheal tube was positioned by the anesthesiologist the position  confirmed. The left chest was prepped and draped as a sterile field. A small  incision was  made and the thoracoscope was inserted. She had adhesions at  the left lung apex and this prevented adequate mobilization and  visualization of the lung with the telescope. The incision was then  lengthened to approximately 10 cm and the pleural space was entered in the  bed of the fifth rib.  It was retracted and using chest wall gentle  retraction, the pleural adhesions were taken down and the lung was  completely mobilized. Lung was inspected and palpated and there was a firm  nodule in the posterior aspect of left upper lobe.  There were no other  nodules in the left upper or lower lobe. The inferior pulmonary ligament was  then mobilized. The left upper lobe nodule was then removed in a generous  wedge resection with adequate margin. We used the  endoscopic GIA surgical  staplers and the staple line was inspected and found to be airtight and  hemostatic. The frozen section of the pulmonary wedge specimen returned non-  small cell carcinoma with adequate margins.   The mediastinal pleura was then opened over the area of the AP window and a  2 cm lymph node was dissected out with care being taken to avoid injury to  the recurrent laryngeal nerve and the phrenic nerve. This was submitted for  permanent section. Posterior to the main pulmonary another 1 cm lymph node  was dissected out and also submitted for permanent section. Hemostasis was  adequate. The lung was re-expanded and two chest tubes were placed  anteriorly and posteriorly and brought out through separate incisions. The  incision was then closed with interrupted #2 Vicryl for the pericostals and  interrupted #1 Vicryl for the muscle layer. A 2-0 Vicryl and 3-0  subcuticular suture were used to close the incision. The patient was  extubated in the operating room and returned to recovery room in stable  condition breathing on her own with an oxygen mask.      PV/MEDQ  D:  04/14/2004  T:  04/15/2004  Job:  [id number]   cc:   Brunetta Jeans  [address]  [place]  [place] [address]  Fax: [phone]   [facility] office   Dr. Thalia Party, [facility]

## 2010-07-17 ENCOUNTER — Ambulatory Visit: Payer: Self-pay | Admitting: Oncology

## 2010-09-17 ENCOUNTER — Ambulatory Visit: Payer: Self-pay | Admitting: Gynecologic Oncology

## 2010-09-22 ENCOUNTER — Ambulatory Visit: Payer: Self-pay | Admitting: Oncology

## 2010-10-17 ENCOUNTER — Ambulatory Visit: Payer: Self-pay | Admitting: Oncology

## 2010-11-16 LAB — GLUCOSE, CAPILLARY
Glucose-Capillary: 123 — ABNORMAL HIGH
Glucose-Capillary: 138 — ABNORMAL HIGH
Glucose-Capillary: 95
Glucose-Capillary: 95
Glucose-Capillary: 97

## 2010-11-16 LAB — APTT: aPTT: 30

## 2010-11-16 LAB — PROTIME-INR
INR: 0.9
Prothrombin Time: 12.7

## 2010-11-16 LAB — TSH: TSH: 2.528

## 2010-11-16 LAB — D-DIMER, QUANTITATIVE: D-Dimer, Quant: 0.35

## 2011-02-22 ENCOUNTER — Ambulatory Visit: Payer: Self-pay | Admitting: Gynecologic Oncology

## 2011-02-23 ENCOUNTER — Ambulatory Visit: Payer: Self-pay | Admitting: Oncology

## 2011-02-23 LAB — COMPREHENSIVE METABOLIC PANEL
Albumin: 3.9 g/dL (ref 3.4–5.0)
Alkaline Phosphatase: 86 U/L (ref 50–136)
Anion Gap: 10 (ref 7–16)
BUN: 13 mg/dL (ref 7–18)
Calcium, Total: 9.2 mg/dL (ref 8.5–10.1)
Chloride: 101 mmol/L (ref 98–107)
Co2: 31 mmol/L (ref 21–32)
Creatinine: 0.82 mg/dL (ref 0.60–1.30)
EGFR (African American): >60
EGFR (Non-African Amer.): >60
Glucose: 100 mg/dL — ABNORMAL HIGH (ref 65–99)
SGOT(AST): 22 U/L (ref 15–37)
SGPT (ALT): 22 U/L

## 2011-02-23 LAB — CBC CANCER CENTER
Eosinophil %: 1.1 %
HCT: 41.6 % (ref 35.0–47.0)
Lymphocyte #: 2 x10 3/mm (ref 1.0–3.6)
MCV: 92 fL (ref 80–100)
Monocyte %: 5.8 %
Neutrophil %: 65.1 %
Platelet: 369 x10 3/mm (ref 150–440)
RBC: 4.5 10*6/uL (ref 3.80–5.20)
WBC: 7.4 x10 3/mm (ref 3.6–11.0)

## 2011-02-24 LAB — CA 125: CA 125: 19.8 U/mL (ref 0.0–34.0)

## 2011-03-19 ENCOUNTER — Ambulatory Visit: Payer: Self-pay | Admitting: Oncology

## 2011-07-29 ENCOUNTER — Ambulatory Visit: Payer: Self-pay | Admitting: Internal Medicine

## 2011-08-20 ENCOUNTER — Ambulatory Visit: Payer: Self-pay | Admitting: Gynecologic Oncology

## 2011-08-24 ENCOUNTER — Ambulatory Visit: Payer: Self-pay | Admitting: Oncology

## 2011-08-24 LAB — CBC CANCER CENTER
Eosinophil #: 0.2 x10 3/mm (ref 0.0–0.7)
Eosinophil %: 2.3 %
HCT: 41.4 % (ref 35.0–47.0)
HGB: 13.6 g/dL (ref 12.0–16.0)
Lymphocyte %: 36.1 %
MCH: 30.3 pg (ref 26.0–34.0)
MCV: 93 fL (ref 80–100)
Monocyte #: 0.6 x10 3/mm (ref 0.2–0.9)
Platelet: 294 x10 3/mm (ref 150–440)
RBC: 4.47 10*6/uL (ref 3.80–5.20)
RDW: 12.9 % (ref 11.5–14.5)
WBC: 7.8 x10 3/mm (ref 3.6–11.0)

## 2011-08-24 LAB — COMPREHENSIVE METABOLIC PANEL
Alkaline Phosphatase: 78 U/L (ref 50–136)
Anion Gap: 7 (ref 7–16)
Bilirubin,Total: 0.4 mg/dL (ref 0.2–1.0)
Calcium, Total: 9.3 mg/dL (ref 8.5–10.1)
Co2: 29 mmol/L (ref 21–32)
EGFR (Non-African Amer.): >60
Glucose: 99 mg/dL (ref 65–99)
Osmolality: 276 (ref 275–301)
Potassium: 4.1 mmol/L (ref 3.5–5.1)
SGOT(AST): 19 U/L (ref 15–37)
Sodium: 137 mmol/L (ref 136–145)

## 2011-09-16 ENCOUNTER — Ambulatory Visit: Payer: Self-pay | Admitting: Oncology

## 2011-11-25 LAB — COMPREHENSIVE METABOLIC PANEL
Albumin: 3.5 g/dL (ref 3.4–5.0)
Alkaline Phosphatase: 84 U/L (ref 50–136)
BUN: 10 mg/dL (ref 7–18)
Bilirubin,Total: 0.2 mg/dL (ref 0.2–1.0)
Chloride: 104 mmol/L (ref 98–107)
Co2: 26 mmol/L (ref 21–32)
Creatinine: 0.67 mg/dL (ref 0.60–1.30)
EGFR (Non-African Amer.): >60
Glucose: 165 mg/dL — ABNORMAL HIGH (ref 65–99)
Osmolality: 280 (ref 275–301)
SGOT(AST): 26 U/L (ref 15–37)
SGPT (ALT): 19 U/L (ref 12–78)
Sodium: 139 mmol/L (ref 136–145)
Total Protein: 7 g/dL (ref 6.4–8.2)

## 2011-11-25 LAB — URINALYSIS, COMPLETE
Bacteria: NONE SEEN
Glucose,UR: NEGATIVE mg/dL (ref 0–75)
Ketone: NEGATIVE
Nitrite: NEGATIVE
Ph: 5 (ref 4.5–8.0)
Protein: 30
RBC,UR: 4 /HPF (ref 0–5)
Specific Gravity: 1.015 (ref 1.003–1.030)
WBC UR: 26 /HPF (ref 0–5)

## 2011-11-25 LAB — CBC
HCT: 38.4 % (ref 35.0–47.0)
RBC: 4.2 10*6/uL (ref 3.80–5.20)
RDW: 13 % (ref 11.5–14.5)
WBC: 10.4 10*3/uL (ref 3.6–11.0)

## 2011-11-25 LAB — LIPASE, BLOOD: Lipase: 160 U/L (ref 73–393)

## 2011-11-25 LAB — CK TOTAL AND CKMB (NOT AT ARMC)
CK, Total: 119 U/L (ref 21–215)
CK-MB: 3.1 ng/mL (ref 0.5–3.6)

## 2011-11-25 LAB — TROPONIN I: Troponin-I: 0.18 ng/mL — ABNORMAL HIGH

## 2011-11-26 ENCOUNTER — Inpatient Hospital Stay: Payer: Self-pay | Admitting: Internal Medicine

## 2011-11-26 LAB — APTT
Activated PTT: 29.7 secs (ref 23.6–35.9)
Activated PTT: 130.6 secs — ABNORMAL HIGH (ref 23.6–35.9)

## 2011-11-26 LAB — TROPONIN I: Troponin-I: 3 ng/mL — ABNORMAL HIGH

## 2011-11-27 LAB — CBC WITH DIFFERENTIAL/PLATELET
Basophil #: 0 10*3/uL (ref 0.0–0.1)
Eosinophil %: 5 %
HCT: 33 % — ABNORMAL LOW (ref 35.0–47.0)
Lymphocyte %: 26.2 %
MCV: 92 fL (ref 80–100)
Monocyte #: 0.7 x10 3/mm (ref 0.2–0.9)
Monocyte %: 10.5 %
Neutrophil #: 3.9 10*3/uL (ref 1.4–6.5)
Platelet: 259 10*3/uL (ref 150–440)
RBC: 3.6 10*6/uL — ABNORMAL LOW (ref 3.80–5.20)
RDW: 13.4 % (ref 11.5–14.5)
WBC: 6.7 10*3/uL (ref 3.6–11.0)

## 2011-11-27 LAB — COMPREHENSIVE METABOLIC PANEL
Albumin: 2.8 g/dL — ABNORMAL LOW (ref 3.4–5.0)
Alkaline Phosphatase: 64 U/L (ref 50–136)
Anion Gap: 7 (ref 7–16)
BUN: 8 mg/dL (ref 7–18)
Bilirubin,Total: 0.3 mg/dL (ref 0.2–1.0)
Calcium, Total: 8.5 mg/dL (ref 8.5–10.1)
EGFR (African American): >60
EGFR (Non-African Amer.): >60
Glucose: 87 mg/dL (ref 65–99)
Potassium: 3.6 mmol/L (ref 3.5–5.1)
SGOT(AST): 19 U/L (ref 15–37)
Sodium: 141 mmol/L (ref 136–145)
Total Protein: 5.8 g/dL — ABNORMAL LOW (ref 6.4–8.2)

## 2011-11-27 LAB — URINE CULTURE

## 2012-02-16 HISTORY — PX: CHOLECYSTECTOMY: SHX55

## 2012-02-22 ENCOUNTER — Ambulatory Visit: Payer: Self-pay | Admitting: Oncology

## 2012-02-24 ENCOUNTER — Ambulatory Visit: Payer: Self-pay | Admitting: Oncology

## 2012-02-24 LAB — COMPREHENSIVE METABOLIC PANEL
Alkaline Phosphatase: 87 U/L (ref 50–136)
Glucose: 104 mg/dL — ABNORMAL HIGH (ref 65–99)
Total Protein: 7.6 g/dL (ref 6.4–8.2)

## 2012-02-24 LAB — CBC CANCER CENTER
Basophil #: 0.1 x10 3/mm (ref 0.0–0.1)
Eosinophil #: 0.2 x10 3/mm (ref 0.0–0.7)
HCT: 39.8 % (ref 35.0–47.0)
HGB: 13.4 g/dL (ref 12.0–16.0)
Lymphocyte #: 2.5 x10 3/mm (ref 1.0–3.6)
Lymphocyte %: 33.6 %
MCH: 30.5 pg (ref 26.0–34.0)
MCV: 91 fL (ref 80–100)
Monocyte %: 10.7 %
Neutrophil #: 3.9 x10 3/mm (ref 1.4–6.5)
Neutrophil %: 51.8 %
Platelet: 328 x10 3/mm (ref 150–440)

## 2012-02-28 ENCOUNTER — Ambulatory Visit: Payer: Self-pay | Admitting: Internal Medicine

## 2012-03-09 DIAGNOSIS — J449 Chronic obstructive pulmonary disease, unspecified: Secondary | ICD-10-CM | POA: Insufficient documentation

## 2012-03-18 ENCOUNTER — Ambulatory Visit: Payer: Self-pay | Admitting: Oncology

## 2012-03-20 ENCOUNTER — Ambulatory Visit: Payer: Self-pay | Admitting: General Surgery

## 2012-03-24 ENCOUNTER — Ambulatory Visit: Payer: Self-pay | Admitting: General Surgery

## 2012-03-27 LAB — PATHOLOGY REPORT

## 2012-07-31 ENCOUNTER — Ambulatory Visit: Payer: Self-pay | Admitting: Internal Medicine

## 2012-08-15 ENCOUNTER — Ambulatory Visit: Payer: Self-pay | Admitting: Oncology

## 2012-08-22 ENCOUNTER — Ambulatory Visit: Payer: Self-pay | Admitting: Oncology

## 2012-08-29 LAB — COMPREHENSIVE METABOLIC PANEL
Albumin: 3.9 g/dL (ref 3.4–5.0)
Alkaline Phosphatase: 67 U/L (ref 50–136)
Anion Gap: 8 (ref 7–16)
BUN: 15 mg/dL (ref 7–18)
Bilirubin,Total: 0.5 mg/dL (ref 0.2–1.0)
Chloride: 101 mmol/L (ref 98–107)
Osmolality: 280 (ref 275–301)
Potassium: 4 mmol/L (ref 3.5–5.1)

## 2012-08-29 LAB — CBC CANCER CENTER
Basophil #: 0 x10 3/mm (ref 0.0–0.1)
Basophil %: 0.7 %
Eosinophil #: 0.1 x10 3/mm (ref 0.0–0.7)
Eosinophil %: 1.5 %
HCT: 39.2 % (ref 35.0–47.0)
HGB: 13.5 g/dL (ref 12.0–16.0)
Lymphocyte %: 32.1 %
MCH: 31.8 pg (ref 26.0–34.0)
MCV: 92 fL (ref 80–100)
Monocyte #: 0.5 x10 3/mm (ref 0.2–0.9)
Monocyte %: 7.1 %
Neutrophil %: 58.6 %
Platelet: 337 x10 3/mm (ref 150–440)
WBC: 7.4 x10 3/mm (ref 3.6–11.0)

## 2012-08-30 LAB — CA 125: CA 125: 25.4 U/mL (ref 0.0–34.0)

## 2012-09-15 ENCOUNTER — Ambulatory Visit: Payer: Self-pay | Admitting: Oncology

## 2012-11-15 HISTORY — PX: BREAST BIOPSY: SHX20

## 2012-11-17 ENCOUNTER — Ambulatory Visit: Payer: Self-pay | Admitting: Internal Medicine

## 2012-11-22 ENCOUNTER — Ambulatory Visit: Payer: Self-pay | Admitting: Internal Medicine

## 2012-11-22 DIAGNOSIS — D242 Benign neoplasm of left breast: Secondary | ICD-10-CM | POA: Insufficient documentation

## 2012-11-23 LAB — PATHOLOGY REPORT

## 2012-11-27 ENCOUNTER — Encounter: Payer: Self-pay | Admitting: *Deleted

## 2012-12-14 ENCOUNTER — Other Ambulatory Visit: Payer: Medicare Other

## 2012-12-14 ENCOUNTER — Ambulatory Visit (INDEPENDENT_AMBULATORY_CARE_PROVIDER_SITE_OTHER): Payer: Medicare Other | Admitting: General Surgery

## 2012-12-14 ENCOUNTER — Encounter: Payer: Self-pay | Admitting: General Surgery

## 2012-12-14 VITALS — BP 130/74 | HR 68 | Resp 16 | Ht 62.5 in | Wt 114.0 lb

## 2012-12-14 DIAGNOSIS — D249 Benign neoplasm of unspecified breast: Secondary | ICD-10-CM

## 2012-12-14 DIAGNOSIS — N63 Unspecified lump in unspecified breast: Secondary | ICD-10-CM

## 2012-12-14 DIAGNOSIS — D242 Benign neoplasm of left breast: Secondary | ICD-10-CM

## 2012-12-14 NOTE — Progress Notes (Signed)
Patient ID: [name], female   DOB: December 29, 1933, 79 y.o.   MRN: [id number]  Chief Complaint  Patient presents with  . Other    HPI [name] is a 79 y.o. female who presents for an evaluation of her left breast papilloma. She underwent a left breast biopsy at [facility]. She was having clear left breast nipple discharge which prompted repeat imaging. She does regular self breast and regular mammograms done. She denies any lumps, bump, pain, or tenderness. She denies any nipple discharge at this time.  The patient reported that 2 years ago she noticed dimpling across the upper aspect of the areola. There is been no change since initial discovery. No antecedent trauma. No previous evaluation for this. The patient's weight is down 4 pounds from her March 2014 exam. HPI  Past Medical History  Diagnosis Date  . Hyperlipidemia   . Lung disease   . GERD (gastroesophageal reflux disease)   . Cancer     lung  . Shortness of breath     Past Surgical History  Procedure Laterality Date  . Cholecystectomy  2014  . Lung surgery  2007    upper left lobe  . Tonsillectomy      Family History  Problem Relation Age of Onset  . Cancer Daughter     breast    Social History History  Substance Use Topics  . Smoking status: Never Smoker   . Smokeless tobacco: Never Used  . Alcohol Use: Yes    Allergies  Allergen Reactions  . Morphine And Related Other (See Comments)    halluncinations  . Sulfonamide Derivatives   . Tetanus Toxoid     Current Outpatient Prescriptions  Medication Sig Dispense Refill  . ADVAIR DISKUS 250-50 MCG/DOSE AEPB       . aspirin 81 MG tablet Take 81 mg by mouth daily.      . Calcium Carb-Cholecalciferol (CALCIUM 600 + D PO) Take 1 tablet by mouth daily.      Marland Kitchen LORazepam (ATIVAN) 1 MG tablet       . Multiple Vitamin (MULTIVITAMIN) tablet Take 1 tablet by mouth daily.      . pantoprazole (PROTONIX) 40 MG tablet       . rosuvastatin (CRESTOR) 20 MG tablet  Take 20 mg by mouth daily.      Marland Kitchen SPIRIVA HANDIHALER 18 MCG inhalation capsule        No current facility-administered medications for this visit.    Review of Systems Review of Systems  Constitutional: Negative.   Respiratory: Negative.   Cardiovascular: Negative.     Blood pressure 130/74, pulse 68, resp. rate 16, height 5' 2.5" (1.588 m), weight 114 lb (51.71 kg).  Physical Exam Physical Exam  Constitutional: She is oriented to person, place, and time. She appears well-developed and well-nourished.  Eyes: No scleral icterus.  Cardiovascular: Normal rate, regular rhythm and normal heart sounds.   Pulmonary/Chest: Breath sounds normal. Right breast exhibits no inverted nipple, no mass, no nipple discharge, no skin change and no tenderness. Left breast exhibits no inverted nipple, no mass, no nipple discharge, no skin change and no tenderness.  Visual inspection of the left nipple areolar complex shows a very faint dimpling extending from the edge of the areola at the 10:00 position toward the midportion of the space between the base of the nipple and the edge of the areola at the 1:00 position. The left nipple is slightly less prominent than the right. There was  no tactile thickness to the nipple or the retroareolar tissue. The biopsy site was healing well  Lymphadenopathy:    She has no cervical adenopathy.    She has no axillary adenopathy.  Neurological: She is alert and oriented to person, place, and time.  Skin: Skin is warm and dry.    Data Reviewed Mammograms beginning on 07/29/2011 through 11/22/2012 were reviewed. There was a subtle change in the retroareolar tissue between the June 2013 and 2014 exam. No interval change from that time. Ultrasound images were reviewed from October 2014.  Biopsy results from ultrasound guided biopsy dated 11/22/2012 showed an intraductal papilloma without atypia or malignancy.  Ultrasound examination of the left retroareolar area showed a  prominent duct at the 1:00 position measuring 0.21 cm in diameter and up to 3 cm in length. Due to termination of this duct at the 1:00 position 3 cm from the nipple a 0.3 x 0.6 cm hypoechoic nodule with a likely clip present representing the recently biopsied papilloma is evident. In the immediate retroareolar tissue the ductal diameter increases to 0.29 centimeters.  In the right breast the maximum ductal dilatation is 0.26 cm, although the extent of the ductal dilatation is less on the left.  Assessment    Left breast papilloma, no evidence of malignancy.    Plan    Without evidence of atypia, and the patient presently asymptomatic, observation alone is warranted. Arrangements were made for a follow up ultrasound in 6 months. If there there is enlargement of the lesion, or progressive/recurrent drainage duct excision can be undertaken.  The report that the dimpling of the areola has been present and unchanged for 2 years makes me less suspicious of a cold malignant process.  The patient's daughter was present for today's interview and exam.       [name] 12/15/2012, 10:51 AM

## 2012-12-14 NOTE — Patient Instructions (Signed)
Patient to return in six months from breast ultrasound.

## 2012-12-15 ENCOUNTER — Encounter: Payer: Self-pay | Admitting: General Surgery

## 2012-12-25 ENCOUNTER — Encounter: Payer: Self-pay | Admitting: General Surgery

## 2013-03-09 ENCOUNTER — Ambulatory Visit: Payer: Self-pay | Admitting: Oncology

## 2013-03-12 LAB — COMPREHENSIVE METABOLIC PANEL
ALBUMIN: 4 g/dL (ref 3.4–5.0)
ALT: 18 U/L (ref 12–78)
ANION GAP: 7 (ref 7–16)
AST: 17 U/L (ref 15–37)
Alkaline Phosphatase: 64 U/L
BUN: 14 mg/dL (ref 7–18)
Bilirubin,Total: 0.4 mg/dL (ref 0.2–1.0)
Calcium, Total: 8.7 mg/dL (ref 8.5–10.1)
Chloride: 98 mmol/L (ref 98–107)
Co2: 30 mmol/L (ref 21–32)
Creatinine: 0.76 mg/dL (ref 0.60–1.30)
Glucose: 96 mg/dL (ref 65–99)
OSMOLALITY: 270 (ref 275–301)
Potassium: 3.8 mmol/L (ref 3.5–5.1)
Sodium: 135 mmol/L — ABNORMAL LOW (ref 136–145)
TOTAL PROTEIN: 7.3 g/dL (ref 6.4–8.2)

## 2013-03-12 LAB — CBC CANCER CENTER
BASOS ABS: 0 x10 3/mm (ref 0.0–0.1)
Basophil %: 0.5 %
EOS ABS: 0.1 x10 3/mm (ref 0.0–0.7)
EOS PCT: 1.6 %
HCT: 41.1 % (ref 35.0–47.0)
HGB: 13.6 g/dL (ref 12.0–16.0)
LYMPHS PCT: 28.5 %
Lymphocyte #: 2.2 x10 3/mm (ref 1.0–3.6)
MCH: 30.9 pg (ref 26.0–34.0)
MCHC: 33 g/dL (ref 32.0–36.0)
MCV: 94 fL (ref 80–100)
MONO ABS: 0.6 x10 3/mm (ref 0.2–0.9)
Monocyte %: 7.4 %
Neutrophil #: 4.7 x10 3/mm (ref 1.4–6.5)
Neutrophil %: 62 %
Platelet: 334 x10 3/mm (ref 150–440)
RBC: 4.39 10*6/uL (ref 3.80–5.20)
RDW: 13.3 % (ref 11.5–14.5)
WBC: 7.7 x10 3/mm (ref 3.6–11.0)

## 2013-03-18 ENCOUNTER — Ambulatory Visit: Payer: Self-pay | Admitting: Oncology

## 2013-05-25 ENCOUNTER — Ambulatory Visit: Payer: Self-pay | Admitting: Internal Medicine

## 2013-06-04 ENCOUNTER — Ambulatory Visit: Payer: Medicare Other

## 2013-06-04 ENCOUNTER — Encounter: Payer: Self-pay | Admitting: General Surgery

## 2013-06-04 ENCOUNTER — Ambulatory Visit (INDEPENDENT_AMBULATORY_CARE_PROVIDER_SITE_OTHER): Payer: Medicare Other | Admitting: General Surgery

## 2013-06-04 ENCOUNTER — Telehealth: Payer: Self-pay | Admitting: General Surgery

## 2013-06-04 ENCOUNTER — Telehealth: Payer: Self-pay

## 2013-06-04 VITALS — BP 118/72 | HR 88 | Resp 14 | Ht 62.0 in | Wt 105.0 lb

## 2013-06-04 DIAGNOSIS — N63 Unspecified lump in unspecified breast: Secondary | ICD-10-CM

## 2013-06-04 NOTE — Telephone Encounter (Signed)
Patient wanted to talk with her daughter before setting up appointment around 07-16-13.

## 2013-06-04 NOTE — Patient Instructions (Signed)
Patient to return in 6 weeks for follow up. Continue self breast exams. Call office for any new breast issues or concerns.

## 2013-06-04 NOTE — Telephone Encounter (Signed)
The results of today's exam were reviewed with the patient's daughter. She feels that her weight loss is far more related to depression that her oral surgery issues. She sits at home with a dark and talks about death. The identification of the papilloma ongoing drainage may not warrant intervention if it will tip her mother and worsening depression. We'll reassess in 6 weeks, but this may be put on the back burner for longer period of time

## 2013-06-04 NOTE — Progress Notes (Signed)
Patient ID: [name], female   DOB: September 20, 1933, 80 y.o.   MRN: [id number]  Chief Complaint  Patient presents with  . Follow-up    left breast ultrasound    HPI [name] is a 80 y.o. female. here today for a left breast ultrasound. Patient states she has had left breast drainage that is brownish in color. The patient states she is performing self breast checks.  The patient has lost about 9 pounds from her October 2014 exam. She attributes this to having extensive dental work including having all of her teeth pulled. Poor fitting dentures have not helped.  Marland KitchenHPI  Past Medical History  Diagnosis Date  . Hyperlipidemia   . Lung disease   . GERD (gastroesophageal reflux disease)   . Cancer     lung  . Shortness of breath     Past Surgical History  Procedure Laterality Date  . Cholecystectomy  2014  . Lung surgery  2007    upper left lobe  . Tonsillectomy      Family History  Problem Relation Age of Onset  . Cancer Daughter     breast    Social History History  Substance Use Topics  . Smoking status: Never Smoker   . Smokeless tobacco: Never Used  . Alcohol Use: Yes    Allergies  Allergen Reactions  . Morphine And Related Other (See Comments)    halluncinations  . Sulfonamide Derivatives   . Tetanus Toxoid     Current Outpatient Prescriptions  Medication Sig Dispense Refill  . ADVAIR DISKUS 250-50 MCG/DOSE AEPB       . aspirin 81 MG tablet Take 81 mg by mouth daily.      . Calcium Carb-Cholecalciferol (CALCIUM 600 + D PO) Take 1 tablet by mouth daily.      . cyclobenzaprine (FLEXERIL) 10 MG tablet Take 10 mg by mouth at bedtime.      Marland Kitchen LORazepam (ATIVAN) 1 MG tablet       . mirtazapine (REMERON) 30 MG tablet Take 1 tablet by mouth daily.      . Multiple Vitamin (MULTIVITAMIN) tablet Take 1 tablet by mouth daily.      Marland Kitchen omeprazole (PRILOSEC) 40 MG capsule Take 1 capsule by mouth daily.      . pantoprazole (PROTONIX) 40 MG tablet       .  rosuvastatin (CRESTOR) 20 MG tablet Take 20 mg by mouth daily.      Marland Kitchen SPIRIVA HANDIHALER 18 MCG inhalation capsule        No current facility-administered medications for this visit.    Review of Systems Review of Systems  Constitutional: Negative.   Respiratory: Negative.   Cardiovascular: Negative.     Blood pressure 118/72, pulse 88, resp. rate 14, height 5\' 2"  (1.575 m), weight 105 lb (47.628 kg).  Physical Exam Physical Exam  Constitutional: She is oriented to person, place, and time. She appears well-developed and well-nourished.  Neck: Neck supple. No thyromegaly present.  Cardiovascular: Normal rate, regular rhythm and normal heart sounds.   No murmur heard. Pulmonary/Chest: Breath sounds normal. Right breast exhibits no inverted nipple, no mass, no nipple discharge, no skin change and no tenderness. Left breast exhibits nipple discharge (clear yellow drainage). Left breast exhibits no inverted nipple, no mass, no skin change and no tenderness.  Right lower lobe crackles.   Lymphadenopathy:    She has no cervical adenopathy.    She has no axillary adenopathy.  Neurological: She is  alert and oriented to person, place, and time.  Skin: Skin is warm and dry.    Data Reviewed Ultrasound examination of the retroareolar area again showed ductal dilatation up to 0.27 cm. The area of the previously identified papilloma 3 cm in the nipple at the 3 o'clock position of left breast again measured approximately 0.5 x 0.6 x 0.6 cm.  Assessment    Papilloma left breast with persistent nipple drainage.     Plan    I discussed elective excision under local anesthesia with sedation, the patient immediately back the way reporting that she was not strong enough.  I spoke with the patient's daughter by phone the evening of the visit. She finds her mother to be very depressed, sitting in the dark, talking about death. She thinks are weight loss is more related to depression then the oral  surgery issues.  The patient was amenable to 6 week followup, but in light of the daughter's report, I think it's more reasonable to move this out to 6 months as there's been no significant change on ultrasound.       PCP: Dyke Brackett Shaana Acocella 06/04/2013, 8:50 PM

## 2013-06-05 ENCOUNTER — Telehealth: Payer: Self-pay

## 2013-06-05 NOTE — Telephone Encounter (Signed)
Message copied by Lesly Rubenstein on Tue Jun 05, 2013  8:25 AM ------      Message from: [place], [place]      Created: Mon Jun 04, 2013  8:53 PM       Please notify the patient tomorrow push her followup back to 6 months. Also what her daughter know. Thank you ------

## 2013-06-05 NOTE — Telephone Encounter (Signed)
Spoke with patient and let her know about following up in 6 months instead of 6 weeks. Patient is amendable to this. Also left a message with patient's daughter, Gwynneth Munson, about this. Patient placed in recalls for October.

## 2013-07-24 DIAGNOSIS — F419 Anxiety disorder, unspecified: Secondary | ICD-10-CM

## 2013-07-24 DIAGNOSIS — E785 Hyperlipidemia, unspecified: Secondary | ICD-10-CM | POA: Insufficient documentation

## 2013-07-24 DIAGNOSIS — F329 Major depressive disorder, single episode, unspecified: Secondary | ICD-10-CM | POA: Insufficient documentation

## 2013-07-24 DIAGNOSIS — I7090 Unspecified atherosclerosis: Secondary | ICD-10-CM | POA: Insufficient documentation

## 2013-07-24 DIAGNOSIS — I739 Peripheral vascular disease, unspecified: Secondary | ICD-10-CM | POA: Insufficient documentation

## 2013-07-24 DIAGNOSIS — C3412 Malignant neoplasm of upper lobe, left bronchus or lung: Secondary | ICD-10-CM | POA: Insufficient documentation

## 2013-07-24 DIAGNOSIS — G4700 Insomnia, unspecified: Secondary | ICD-10-CM | POA: Insufficient documentation

## 2013-07-24 DIAGNOSIS — K219 Gastro-esophageal reflux disease without esophagitis: Secondary | ICD-10-CM | POA: Insufficient documentation

## 2013-09-04 ENCOUNTER — Ambulatory Visit: Payer: Self-pay | Admitting: Gynecologic Oncology

## 2013-09-04 ENCOUNTER — Ambulatory Visit: Payer: Self-pay | Admitting: Oncology

## 2013-09-05 LAB — CA 125: CA 125: 27.7 U/mL (ref 0.0–34.0)

## 2013-09-15 ENCOUNTER — Ambulatory Visit: Payer: Self-pay | Admitting: Oncology

## 2013-12-04 ENCOUNTER — Ambulatory Visit: Payer: Self-pay | Admitting: Oncology

## 2013-12-06 ENCOUNTER — Ambulatory Visit: Payer: Medicare Other | Admitting: General Surgery

## 2013-12-16 ENCOUNTER — Ambulatory Visit: Payer: Self-pay | Admitting: Oncology

## 2013-12-17 ENCOUNTER — Encounter: Payer: Self-pay | Admitting: General Surgery

## 2014-02-12 ENCOUNTER — Other Ambulatory Visit: Payer: Medicare Other

## 2014-02-12 ENCOUNTER — Encounter: Payer: Self-pay | Admitting: General Surgery

## 2014-02-12 ENCOUNTER — Ambulatory Visit (INDEPENDENT_AMBULATORY_CARE_PROVIDER_SITE_OTHER): Payer: Medicare Other | Admitting: General Surgery

## 2014-02-12 VITALS — BP 100/60 | HR 78 | Resp 16 | Ht 62.0 in | Wt 110.0 lb

## 2014-02-12 DIAGNOSIS — N6452 Nipple discharge: Secondary | ICD-10-CM

## 2014-02-12 DIAGNOSIS — D242 Benign neoplasm of left breast: Secondary | ICD-10-CM

## 2014-02-12 NOTE — Progress Notes (Signed)
Patient ID: [name], female   DOB: 08-23-1933, 80 y.o.   MRN: [id number]  Chief Complaint  Patient presents with  . Follow-up    HPI [name] is a 80 y.o. female.  Here today for follow up nipple discharge and office ultrasound. States she still notices some drainage from left nipple but not every day. The drainage is yellowish brown. She states the nipple gets dry and scaly and she uses Vaseline.  HPI  Past Medical History  Diagnosis Date  . Hyperlipidemia   . Lung disease   . GERD (gastroesophageal reflux disease)   . Cancer     lung  . Shortness of breath     Past Surgical History  Procedure Laterality Date  . Cholecystectomy  2014  . Lung surgery  2007    upper left lobe  . Tonsillectomy      Family History  Problem Relation Age of Onset  . Cancer Daughter     breast    Social History History  Substance Use Topics  . Smoking status: Never Smoker   . Smokeless tobacco: Never Used  . Alcohol Use: Yes    Allergies  Allergen Reactions  . Morphine And Related Other (See Comments)    halluncinations  . Sulfonamide Derivatives   . Tetanus Toxoid     Current Outpatient Prescriptions  Medication Sig Dispense Refill  . ADVAIR DISKUS 250-50 MCG/DOSE AEPB Inhale 1 puff into the lungs daily.     Marland Kitchen aspirin 81 MG tablet Take 81 mg by mouth daily.    . Calcium Carb-Cholecalciferol (CALCIUM 600 + D PO) Take 1 tablet by mouth daily.    Marland Kitchen LORazepam (ATIVAN) 1 MG tablet Take 0.5 mg by mouth at bedtime.     . mirtazapine (REMERON) 30 MG tablet Take 1 tablet by mouth daily.    . Multiple Vitamin (MULTIVITAMIN) tablet Take 1 tablet by mouth daily.    Marland Kitchen omeprazole (PRILOSEC) 40 MG capsule Take 1 capsule by mouth daily.    . OXYGEN Inhale into the lungs at bedtime.    . rosuvastatin (CRESTOR) 20 MG tablet Take 20 mg by mouth daily.    Marland Kitchen SPIRIVA HANDIHALER 18 MCG inhalation capsule Place 18 mcg into inhaler and inhale daily.      No current  facility-administered medications for this visit.    Review of Systems Review of Systems  Constitutional: Negative.   Respiratory: Negative.   Cardiovascular: Negative.     Blood pressure 100/60, pulse 78, resp. rate 16, height 5\' 2"  (1.575 m), weight 110 lb (49.896 kg).  Physical Exam Physical Exam  Constitutional: She is oriented to person, place, and time. She appears well-developed and well-nourished.  Neck: Neck supple.  Cardiovascular: Normal rate, regular rhythm and normal heart sounds.   Pulmonary/Chest: Effort normal and breath sounds normal. Right breast exhibits no inverted nipple, no mass, no nipple discharge, no skin change and no tenderness. Left breast exhibits skin change. Left breast exhibits no inverted nipple, no mass, no nipple discharge and no tenderness.  Lung sounds clear but distant. Dimpling left breast, unchanged. Single duct drainage at 10 o'clock left nipple.   Lymphadenopathy:    She has no cervical adenopathy.    She has no axillary adenopathy.  Neurological: She is alert and oriented to person, place, and time.  Skin: Skin is warm and dry.    Data Reviewed Ultrasound examination of the left retroareolar area again showed mildly prominent ductal tissue. At the 1-2:00  position approximate 2.7 cm from the nipple area suggestive of a previously placed biopsy clip is evident. Mildly dilated ducts leading to this area. This corresponds with the previously biopsied papilloma. The retroareolar ductal structures measuring up to 0.33 cm in diameter.    Assessment    Intraductal papilloma with persistent breast drainage.    Plan    At the time of the April exam excision under local anesthesia with sedation was encouraged because of her persistent drainage and at that time, she reported that she was too weak to consider surgical intervention.   This time, with a fairly stable exam but persistent drainage I suggested observation, and then she brought up Y  couldn't the area be excised. I did obtain a sample for cytology I think it's unlikely that this will show malignancy.  She probably do well with excision under local anesthesia with sedation, but she'll consider her options and notify the office of how she would like to proceed. Discussed surgery options. Cytology pending.  Patient has decided to wait to decide weather or not she will have surgery. She will make this decision pending the results of her cytology.  The patient's daughter, [name], was present for the interview and exam.  PCP:  Teena Irani 02/15/2014, 8:59 AM

## 2014-02-12 NOTE — Patient Instructions (Signed)
Continue self breast exams. Call office for any new breast issues or concerns. 

## 2014-02-15 DIAGNOSIS — N6452 Nipple discharge: Secondary | ICD-10-CM | POA: Insufficient documentation

## 2014-02-18 ENCOUNTER — Telehealth: Payer: Self-pay | Admitting: General Surgery

## 2014-02-18 LAB — BREAST DISCHARGE CYTOLOGY

## 2014-02-18 NOTE — Telephone Encounter (Signed)
The patient was notified that the left nipple cytology was negative. At this time she has decided she is going to take a wait-and-see approach.  I spoke with the patient's daughter separately, Earnest Bailey, by phone. She feels her mother is doing better all in all and that she is not opposed to observation.  In the past when I recommended excision the patient wanted to be observed, last visit one I thought that perhaps excision was appropriate because of her increased complaints about the drainage she decided observation was reasonable. At this time I'll think were missing anything of high risk for malignancy.  We'll arrange for a follow-up examination in 6 months.

## 2014-06-04 NOTE — H&P (Signed)
PATIENT NAMEDEBY, Jody MR#:  [id number] DATE OF BIRTH:  06/12/1933  DATE OF ADMISSION:  11/26/2011  REFERRING PHYSICIAN: Pollie Friar, MD   PRIMARY CARE PHYSICIAN: Fulton Reek, MD   CHIEF COMPLAINT: Abdominal pain for one week.   HISTORY OF PRESENT ILLNESS: The patient is a 81 year old Caucasian female with a history of lung cancer, chronic obstructive pulmonary disease on home oxygen, hyperlipidemia, hypertension, myocardial infarction two years ago, presented to the ED with abdominal pain for one week. The patient is alert, awake, oriented. According to her and her granddaughter,  she has had abdominal pain for about one week which is in the epigastric area, intermittent and aching without radiation, associated with nausea and vomiting once. The patient also had diarrhea once. She also complains of increased shortness of breath. She has chronic obstructive pulmonary disease and chronic respiratory failure, on home oxygen. She usually has a cough and shortness of breath, but worsening recently. She said she has a fever and chills for one day. Her CAT scan of the abdomen and pelvis shows cholecystitis and cholelithiasis. Dr. Pat Patrick evaluated the patient in the ED and suggested the patient is not a candidate for surgery at this time due to elevated troponin. The patient's troponin is 0.18, but the patient denies any chest pain, palpitation, orthopnea, or nocturnal dyspnea. No leg swelling. She was treated with Zosyn in the Emergency Department.   PAST MEDICAL HISTORY:  1. Hypertension.  2. Hyperlipidemia.  3. Myocardial infarction two years ago, but cardiac catheterization was normal.  4. Chronic obstructive pulmonary disease, on home oxygen.  5. Lung cancer, status post lobectomy.  6. Anxiety.   PAST SURGICAL HISTORY:  1. Left upper lobectomy and mediastinoscopy.  2. Bowel obstruction surgery.   SOCIAL HISTORY: She quit smoking several years ago. No alcohol drinking or illicit drugs.    FAMILY HISTORY: Father died of coronary artery disease. Two brothers, one with coronary artery disease and is status post coronary artery bypass graft.   REVIEW OF SYSTEMS: CONSTITUTIONAL: The patient denies any headache, dizziness, but has fever, chills, weakness. EYES: No double vision or blurred vision. ENT: No postnasal drip, slurred speech, or dysphagia. No epistaxis. CARDIOVASCULAR: No chest pain, palpitation, orthopnea, or nocturnal dyspnea. No leg edema. PULMONARY: Positive for cough, sputum, shortness of breath. No hemoptysis. GASTROINTESTINAL: Positive for abdominal pain, nausea, vomiting, and diarrhea. No melena or bloody stool. GENITOURINARY: No dysuria, hematuria, or incontinence but has history of urinary tract infection. SKIN: No rash or jaundice. HEMATOLOGY: No easy bruising, bleeding. NEUROLOGY: No syncope, loss of consciousness or seizure.   ALLERGIES: Sulfa drugs, tetanus.   HOME MEDICATIONS:  1. Spiriva 18 mcg inhalation, one inhaled.  2. ProAir HFA 90 mcg INH, one inhaled.  3. Oxygen 2 liters at home.  4. Omeprazole 20 mg p.o. daily.  5. Lorazepam 1 tablet once a day p.r.n.  6. Crestor 20 mg p.o. once daily.  7. Centrum Silver once daily.  8. Calcarb 630/400, 1 tablet once a day.  9. Aspirin 81 mg p.o. daily.  10. Advair 250 mcg/50 mcg inhalation.   PHYSICAL EXAMINATION:  VITAL SIGNS: Temperature 98.9, blood pressure 96/60, pulse 92, oxygen saturation 94% on oxygen, respirations 18.   GENERAL: The patient is alert, awake, oriented, in no acute distress on oxygen.   HEENT: Pupils are round, equal, reactive to light and accommodation. Moist oral mucosa. Clear oropharynx.   NECK: Supple. No JVD or carotid bruit. No lymphadenopathy. No thyromegaly.  CARDIOVASCULAR: S1, S2  regular rate, rhythm. No murmurs or gallops.   PULMONARY: Bilateral air entry. Very weak breath sounds but no wheezing or rales. No crackles.   ABDOMEN: Soft, bowel sounds present. Tenderness in  epigastric area. No distention. No obvious organomegaly.   EXTREMITIES: No edema, clubbing, or cyanosis. No calf tenderness. Strong bilateral pedal pulses.   SKIN: No rash or jaundice.   NEUROLOGY: Alert and oriented x3. No focal deficit. Power five out of five. Sensation intact. Deep tendon reflexes 2+.   LABORATORY, DIAGNOSTIC AND RADIOLOGICAL DATA: Urinalysis shows WBC 26, RBC 4, nitrite negative. Chest x-ray: No acute abnormality. CBC:  Normal glucose 165, BUN 10, creatinine 0.67. Electrolytes are normal. Lipase 160, CK 119, CK-MB 3.1, troponin 0.18. EKG showed sinus tachycardia at 116 beats per minute. Ultrasound of gallbladder showed cholelithiasis with finding of acute or chronic cholecystitis. No current evidence of a biliary duct enlargement.   IMPRESSION:  1. Elevated troponin, possible demanding cardiac ischemia, need to rule out ACS.  2. Cholecystitis with cholelithiasis. 3. Urinary tract infection.  4. Chronic obstructive pulmonary disease.  5. Chronic respiratory failure.  6. Hypertension.  7. Hyperlipidemia.  8. Lung cancer.   PLAN OF TREATMENT: 1. The patient will be admitted to a telemetry floor. We will continue O2 by nasal cannula. Start aspirin 325 mg p.o. daily and start heparin drip. Follow-up PTT. Continue Crestor. Follow up troponin level. We will get a Cardiology consult.  2. For cholelithiasis and urinary tract infection, we will start Rocephin and follow up urine culture and follow up with Dr. Pat Patrick.  We will keep n.p.o. for now and give IV fluid support.  3. For chronic obstructive pulmonary disease and chronic respiratory failure, we will continue oxygen by nasal cannula and continue Advair, Spiriva; and we will give DuoNebs p.r.n. for shortness of breath.  4. GI prophylaxis.   I discussed the patient's situation and the plan of treatment with the patient and the patient's granddaughter.   TIME SPENT: About 60 minutes.   ____________________________ Demetrios Loll, MD qc:cbb D: 11/26/2011 00:46:32 ET T: 11/26/2011 07:43:29 ET JOB#: [id number]  cc: Demetrios Loll, MD, <Dictator> Leonie Douglas. Doy Hutching, MD Demetrios Loll MD ELECTRONICALLY SIGNED 11/26/2011 16:27

## 2014-06-04 NOTE — Consult Note (Signed)
PATIENT NAMETIEARRA, [name] MR#:  [id number] DATE OF BIRTH:  1933-08-11  DATE OF CONSULTATION:  11/25/2011  REFERRING PHYSICIAN:   CONSULTING PHYSICIAN:  Micheline Maze, MD PRIMARY CARE PHYSICIAN: Fulton Reek, MD  CHIEF COMPLAINT: Abdominal pain, indigestion, nausea.   BRIEF HISTORY: [name] is a 81 year old woman seen in the emergency room with a 2 to 3 week history of significant midepigastric abdominal discomfort associated with intermittent nausea, occasional vomiting, and marked indigestion. She has been having symptoms on and off for several years, was felt to have biliary tract disease some time ago. She was not offered surgical intervention at that time because of her multiple medical problems. She had said symptoms really have worsened over the last 24 hours with increasing abdominal pain, marked nausea. She tried to reach her primary care doctor today for help but was unable to be seen and was referred to the emergency room for further evaluation. She presented to the emergency room with the above complaints.   Workup in the emergency room revealed a normal white blood cell count at 10,400. Electrolytes were unremarkable. Liver function studies were unremarkable. Lipase was normal. Serum troponin was slightly elevated at 0.18. EKG is pending. Plain films revealed changes consistent with the previously identified emphysema but no evidence of any acute pulmonary process. Ultrasound was performed which revealed multiple gallstones, pericholecystic fluid, mild gallbladder wall thickening, and a positive sonographic Murphy's sign. The surgical service was consulted.  She relates multiple episodes of abdominal pain over the last several years. She has had no history of hepatitis, yellow jaundice, pancreatitis, peptic ulcer disease, or diverticulosis. She had had a significant pancolitis several years ago felt to be antibiotic-related. Colonoscopy at that time revealed some  diverticulosis. She was treated with multiple antibiotics, and her symptoms resolved. She has not had any further problem with that disease process. She had no previous abdominal surgery.   She does have a history of lung cancer, having undergone wedge resection. She was a longstanding cigarette smoker and gave up the habit in 2003. Her lung cancer at the time was felt to be a T1 and T2 lesion with metastatic disease to a lymph node, negative by PET scan. She is a stage IIIa [employer] staging. She has significant chronic obstructive lung disease and symptomatic shortness of breath. She has no history of angina but did have an episode of respiratory insufficiency with ventilatory support two years ago. That problem was felt to be secondary to acute on chronic respiratory failure. She had a positive troponin at that time, was worked up by Cardiology, felt to have a non-Q wave MI or stress cardiomyopathy. She was not felt to have active coronary artery disease at the time, was not offered any intervention for that problem. She was seen by Dr. Lujean Amel and by the [facility] Cardiology service. Her lung surgery was performed in [place]. She denies any history of diabetes or thyroid disease.   CURRENT MEDICATIONS:  1. Crestor 20 mg p.o. daily.  2. Omeprazole 20 mg p.o. daily.  3. Spiriva 18 mcg b.i.d.   4. Advair 250/50 b.i.d. 5. ProAir HFA 90 mcg inhaled daily. 6. Aspirin 81 mg p.o. daily.  7. Lorazepam 1 mg p.o. daily p.r.n.  8. Home oxygen at 2L.    ALLERGIES: She is allergic to sulfa drugs.   REVIEW OF SYSTEMS: Positive for multiple urinary tract infections in the past, and she recently had urinary tract symptoms treated with Cipro by  her primary care doctor.  Cipro does increase her abdominal discomfort and may have been responsible for her pancolitis several years ago.     PHYSICAL EXAMINATION:   GENERAL: She is alert, short of breath at rest with supplemental oxygen.  Blood pressure is 94/64, heart rate is 92 and regular, oxygen saturation is 92%, respiratory rate 18 and labored.   HEENT: No scleral icterus. She has no pupillary problems and no facial deformities.  NECK: Supple without adenopathy or tenderness, and her trachea is midline.   CHEST: Clear to my examination but she has very distant breath sounds that are extremely difficult to hear. She does appear to be short of breath at rest but has normal pulmonary excursion.   CARDIAC: No murmurs or gallops to my ear and seems to be in normal sinus rhythm.   ABDOMEN: Her abdomen is really benign. At this point she has no abdominal tenderness. She does not have any positive Murphy's sign. No point tenderness. No rebound. No guarding. No masses. No hernias.   LOWER EXTREMITIES: Reveals good distal pulses, full range of motion, no edema.   PSYCHIATRIC: Normal orientation, normal affect.   ASSESSMENT AND PLAN: This woman appears to present with symptoms consistent with acute cholecystitis. She has multiple medical comorbidities which make her a very poor surgical candidate. I have discussed the plan with the patient and the granddaughter. I would recommend hospitalization under the medical hospitalist service because of her positive troponin and severe carotid chronic lung disease. We would recommend antibiotic therapy. Should that not be successful in resolving her symptoms, I would recommend considering percutaneous cholecystostomy. I do not think she is a surgical candidate, at this point would not recommend surgical intervention. This plan has been discussed with the patient and her family. At the present time they are in agreement.    ____________________________ Micheline Maze, MD rle:vtd D: 11/25/2011 23:51:54 ET T: 11/26/2011 10:10:41 ET JOB#: [id number]  cc: Micheline Maze, MD, <Dictator> Leonie Douglas. Doy Hutching, MD Rodena Goldmann MD ELECTRONICALLY SIGNED 11/26/2011 22:52

## 2014-06-04 NOTE — Consult Note (Signed)
General Aspect 81 yo female with history of severe stage IV copd with continued tobacco use, s/p left upper lobectomy secondary to lung carcinoma, history of insignficant cad by cath in 2009 with a 40% mid lad after a nstemi, history of anxiety, admitted with abdominal and epigastric discomfort. She was noted to have gall stones on abdominal ultrasound. She was ruled in for a nstemi with as erum troponin of 3.0. She denies chest pian at present. She had syncope and a panic attack prior to her first cath. She admits to being under a lot of stress prior to this admission. She is being considered for eith surgical or percutaneious drainage of her gall bladder.   Physical Exam:   GEN disheveled    HEENT PERRL    NECK supple    RESP no use of accessory muscles  rhonchi    CARD Regular rate and rhythm  Normal, S1, S2  Murmur    Murmur Systolic    Systolic Murmur axilla    ABD positive tenderness  normal BS  no Abdominal Bruits  no Adominal Mass    LYMPH negative neck    EXTR negative cyanosis/clubbing    SKIN normal to palpation    NEURO cranial nerves intact, motor/sensory function intact    PSYCH A+O to time, place, person   Review of Systems:   Subjective/Chief Complaint epigastric and abdominal pain.    General: Fatigue    Skin: No Complaints    ENT: No Complaints    Eyes: No Complaints    Neck: No Complaints    Respiratory: Frequent cough  Short of breath    Cardiovascular: Chest pain or discomfort    Gastrointestinal: abdominal pain    Genitourinary: No Complaints    Vascular: No Complaints    Musculoskeletal: No Complaints    Neurologic: No Complaints    Hematologic: No Complaints    Endocrine: No Complaints    Psychiatric: No Complaints    Review of Systems: All other systems were reviewed and found to be negative    ROS Pt not able to provide ROS    Medications/Allergies Reviewed Medications/Allergies reviewed        Admit Diagnosis:    ELEVATED TROPIN LEVEL: 26-Nov-2011, Active, ELEVATED TROPIN LEVEL      Admit Reason:   Elevated troponin level: (790.6) Active, ICD9, Other abnormal blood chemistry  EKG:   EKG NSR    Abnormal NSSTTW changes   Radiology Results: XRay:    10-Oct-13 19:42, Chest Portable Single View   Chest Portable Single View    REASON FOR EXAM:    epigastric pain and increased sob  COMMENTS:       PROCEDURE: DXR - DXR PORTABLE CHEST SINGLE VIEW  - Nov 25 2011  7:42PM     RESULT: Lungs clear. Heart size normal. Old left posterior rib fracture.    IMPRESSION:  No acute abnormality.          Verified By: Osa Craver, M.D., MD  [place]:    10-Oct-13 22:23, US Abdomen Limited Survey   US Abdomen Limited Survey    REASON FOR EXAM:    epigastric pain  COMMENTS:   Body Site: GB and Fossa, CBD, Head of Pancreas    PROCEDURE: [place]  - US ABDOMEN LIMITED SURVEY  - Nov 25 2011 10:23PM     RESULT: History: Pain.    Comparison Study: Ultrasound 04/05/2008.    Findings: Gallstones. Positive Murphy's sign. Minimal gallbladder wall  prominence of 3.1 mm. Common bile duct caliber normal at 3.7 mm. Trace   amount of fluid is noted in the region of the porta hepatis. These   findings    IMPRESSION:   1. Cholelithiasis.  2.Cannot exclude cholecystitis.          Verified By: Osa Craver, M.D., MD    Tetanus /Diphtheria Toxoid: Unknown  Sulfa drugs: Unknown    Impression 81 yo female with history of nonocclusive cad in the lad by cath at [facility] in 2009 now admitted iwth epigastric and abdominal discomfort and abnormal serum tropoinin of 3.0. Also has severe copd and history of lung ca s/p resection of lul. Etiology of elevated tropinin may be progression of her cad vs demand ischmeia. She is being treated for acute gall bladder disease and may require surgical intervention. Will need to assess the etiology of her tropnin elevation and determine her coronary anatomy to guide further therapy.  Long discussion wi8th patient regarding her symtpoms, lab values. Will proceed iwht cardiac cath to evaluate anatomy to guide further therapy.    Plan 1. continue wiht current meds 2. risk and benefits of cardiac cath explained to the patient  3. Proceed iwht cardiac cath to evaluate anatomy in patient with nstemi   Electronic Signatures: Teodoro Spray (MD)  (Signed 11-Oct-13 12:42)  Authored: General Aspect/Present Illness, History and Physical Exam, Review of System, Health Issues, EKG , Radiology, Allergies, Impression/Plan   Last Updated: 11-Oct-13 12:42 by Teodoro Spray (MD)

## 2014-06-04 NOTE — Discharge Summary (Signed)
PATIENT NAMEBATINA, [name] MR#:  [id number] DATE OF BIRTH:  November 26, 1933  DATE OF ADMISSION:  11/26/2011 DATE OF DISCHARGE:  11/29/2011  REASON FOR ADMISSION: Abdominal pain.   HISTORY OF PRESENT ILLNESS: Please see the dictated history of present illness done by Dr. Bridgett Larsson on 11/26/2011.   PAST MEDICAL HISTORY:  1. Chronic respiratory failure, on oxygen.  2. Chronic obstructive pulmonary disease.  3. Lung cancer. 4. Benign hypertension.  5. Hyperlipidemia.  6. Anxiety.  7. Atherosclerotic cardiovascular disease, status post myocardial infarction.   MEDICATIONS ON ADMISSION: Please see admission note.   ALLERGIES: Sulfa and tetanus.   SOCIAL HISTORY, FAMILY HISTORY, AND REVIEW OF SYSTEMS: As per admission note.   PHYSICAL EXAMINATION: The patient was in no acute distress. Vital signs were stable and she was afebrile. HEENT exam was unremarkable. NECK was supple without JVD. LUNGS revealed decreased breath sounds. CARDIAC exam revealed a regular rate and rhythm with normal S1 and S2. ABDOMEN was soft but tender in the epigastrium. EXTREMITIES without edema. NEUROLOGIC exam was grossly nonfocal.   LABORATORY DATA: Laboratory data showed normal CBC with a lipase of 160.   HOSPITAL COURSE: The patient was admitted with acute cholecystitis and cholelithiasis. She also had elevated troponin. Her troponin maxed out at 3. She was seen in consultation by Surgery who felt that the cholecystitis needed to be medically managed as she was high risk for surgery. She was seen by Cardiology because of her elevated troponin. She underwent cardiac catheterization which revealed no significant coronary artery disease. Her MI was felt to be due to vasospasm. She was maintained on IV antibiotics for her cholecystitis with improvement of her symptoms. She was subsequently switched to oral antibiotics. Her heart disease was medically managed. Placement was recommended but the patient deferred. She wanted to go  home. Her diet was advanced and she was tolerating her diet without pain, nausea, or vomiting. She had no further chest pain or shortness of breath. She remained oxygen dependent in the hospital as per her outpatient regimen. By 11/29/2011, the patient was stable and ready for discharge.   DISCHARGE DIAGNOSES:  1. Acute cholecystitis.  2. Non-ST elevation myocardial infarction due to vasospasm.  3. Chronic obstructive pulmonary disease.  4. Chronic respiratory failure, on oxygen.  5. Benign hypertension.  6. Hyperlipidemia.  7. Lung cancer.  8. Anxiety.   DISCHARGE MEDICATIONS:  1. Oxygen at 2 liters per minute per nasal cannula.  2. Crestor 20 mg p.o. daily.  3. Spiriva 1 capsule inhaled daily.  4. Advair 250/50 one puff b.i.d.  5. ProAir 2 puffs q.4 hours p.r.n. shortness of breath.  6. Aspirin 81 mg p.o. daily.  7. Multivitamin 1 p.o. daily.  8. Lorazepam 1 mg p.o. at bedtime.  9. Protonix 40 mg p.o. daily.  10. Senna one p.o. b.i.d. p.r.n. constipation.  11. Zofran 4 mg p.o. q.4 hours p.r.n. nausea and vomiting.  12. Norco 5/325 1 to 2 p.o. q.4 hours p.r.n. pain.  13. Augmentin 500 mg p.o. b.i.d. x10 days.       FOLLOW-UP PLANS AND APPOINTMENTS:  1. The patient was discharged on a bland diet with 2 liters of oxygen.  2. She will be followed by home health.  3. She will follow-up with me in one week's time, sooner if needed.   ____________________________ Leonie Douglas Doy Hutching, MD jds:drc D: 12/04/2011 08:06:58 ET T: 12/05/2011 15:54:59 ET JOB#: [id number]  cc: Leonie Douglas. Doy Hutching, MD, <Dictator> Annie Saephan Lennice Sites MD ELECTRONICALLY SIGNED  12/05/2011 16:55 

## 2014-06-07 NOTE — Op Note (Signed)
PATIENT NAME:  [name], [name] MR#:  [id number] DATE OF BIRTH:  04-24-1933  DATE OF PROCEDURE:  03/24/2012  PREOPERATIVE DIAGNOSIS:  Chronic cholecystitis and cholelithiasis.   POSTOPERATIVE DIAGNOSIS:  Chronic cholecystitis and cholelithiasis.  OPERATIVE PROCEDURE:  Laparoscopic cholecystectomy with intraoperative cholangiograms.   OPERATING SURGEON:  Dr. Hervey Ard.   ANESTHESIA:  General endotracheal under Dr. Marcello Moores.   ESTIMATED BLOOD LOSS:  Less than 5 mL.   FLUID REPLACEMENT:  800 mL crystalloid.   CLINICAL NOTE:  This frail 81 year old woman with severe chronic pulmonary disease has had recurrent bouts of abdominal pain.  She was hospitalized in fall of 2013 with acute cholecystitis, but her pulmonary function was too poor at that time to consider elective cholecystectomy.  Her pulmonary situation has stabilized and she has been evaluated at [facility] and thought to be at intermediate risk for elective intervention.  The episodic pain has become so severe as to interfere with the patient's life and she is aware that she is at risk of pulmonary compromise post procedure.   OPERATIVE NOTE:  The patient was taken to the operating room and underwent general anesthesia.  An arterial line was placed by Dr. Marcello Moores.  The abdomen was prepped with ChloraPrep and draped.  TED stockings and pneumatic compression stockings were used for DVT prophylaxis.  The patient was placed in reverse Trendelenburg position and a Veress needle placed through a transumbilical incision.  After assuring intra-abdominal location through the hanging drop test, the abdomen was insufflated with CO2 at 8 mmHg pressure.  A 10 mm step port was expanded and inspection showed no evidence of injury from initial port placement.  The patient was placed into reverse Trendelenburg position and rolled to the left.  An 11 mm Xcel port was placed in the epigastrium and 2-5 mm Step ports placed laterally.  The gallbladder  showed mild chronic scarring consistent with her previous episode of acute cholecystitis.  The remaining intra-abdominal cavity was unremarkable.  No unusual adhesions, free fluid or mass effect.  The visualized portion of the liver and stomach was unremarkable.   The gallbladder was placed on cephalad traction and the adhesions between the omentum and the gallbladder taken down with cautery dissection.  The neck of the gallbladder was cleared and fluoroscopic cholangiograms completed using 8 mL of one-half strength Conray 60.  This showed prompt filling of the cystic and common bile ducts, free flow into the duodenum and then reflux into both the right and left hepatic ducts.  No evidence of retained stones.  The cystic duct and branches of the cystic artery were doubly clipped and divided.  The gallbladder was then removed from the liver bed with hook cautery dissection.  It was delivered through the umbilical port site without incident.  Inspection from the epigastric site showed no evidence of injury from initial port placement.  The right upper quadrant was irrigated with lactated Ringer's solution.  Inspection showed good hemostasis.  The abdomen was then desufflated and ports removed under direct vision.  The fascia at the umbilicus was closed with an 0 Vicryl figure-of-eight suture.  Skin incisions were closed with 4-0 Vicryl subcuticular sutures.  Benzoin, Steri-Strips, Telfa and Tegaderm dressing was then applied.    The patient tolerated the procedure very well.  Post procedure tidal volumes were over 300 mL, and she was extubated in the operating room without incident.     ____________________________ Robert Bellow, MD jwb:ea D: 03/24/2012 14:51:42 ET T: 03/25/2012 05:36:38 ET  JOB#: [id number]  cc: Robert Bellow, MD, <Dictator> Leonie Douglas. Doy Hutching, MD Martie Lee. Oliva Bustard, MD Robert Bellow, MD Shivonne Schwartzman Amedeo Kinsman MD ELECTRONICALLY SIGNED 03/26/2012 12:23

## 2014-07-30 ENCOUNTER — Encounter: Payer: Self-pay | Admitting: Family Medicine

## 2014-07-30 ENCOUNTER — Other Ambulatory Visit: Payer: Self-pay | Admitting: Family Medicine

## 2014-07-30 DIAGNOSIS — N83202 Unspecified ovarian cyst, left side: Secondary | ICD-10-CM

## 2014-07-30 DIAGNOSIS — N83209 Unspecified ovarian cyst, unspecified side: Secondary | ICD-10-CM

## 2014-07-30 HISTORY — DX: Unspecified ovarian cyst, unspecified side: N83.209

## 2014-08-05 ENCOUNTER — Ambulatory Visit: Payer: Self-pay

## 2014-08-08 ENCOUNTER — Inpatient Hospital Stay (HOSPITAL_BASED_OUTPATIENT_CLINIC_OR_DEPARTMENT_OTHER): Payer: PPO | Admitting: Oncology

## 2014-08-08 ENCOUNTER — Inpatient Hospital Stay: Payer: PPO | Attending: Oncology

## 2014-08-08 ENCOUNTER — Other Ambulatory Visit: Payer: Self-pay

## 2014-08-08 ENCOUNTER — Ambulatory Visit: Payer: Self-pay | Admitting: Oncology

## 2014-08-08 VITALS — BP 121/77 | HR 83 | Temp 98.0°F | Wt 116.0 lb

## 2014-08-08 DIAGNOSIS — K219 Gastro-esophageal reflux disease without esophagitis: Secondary | ICD-10-CM | POA: Diagnosis not present

## 2014-08-08 DIAGNOSIS — E785 Hyperlipidemia, unspecified: Secondary | ICD-10-CM

## 2014-08-08 DIAGNOSIS — Z9049 Acquired absence of other specified parts of digestive tract: Secondary | ICD-10-CM

## 2014-08-08 DIAGNOSIS — Z79899 Other long term (current) drug therapy: Secondary | ICD-10-CM

## 2014-08-08 DIAGNOSIS — C3490 Malignant neoplasm of unspecified part of unspecified bronchus or lung: Secondary | ICD-10-CM

## 2014-08-08 DIAGNOSIS — Z85118 Personal history of other malignant neoplasm of bronchus and lung: Secondary | ICD-10-CM

## 2014-08-08 DIAGNOSIS — Z923 Personal history of irradiation: Secondary | ICD-10-CM | POA: Insufficient documentation

## 2014-08-08 DIAGNOSIS — D242 Benign neoplasm of left breast: Secondary | ICD-10-CM

## 2014-08-08 LAB — COMPREHENSIVE METABOLIC PANEL
ALBUMIN: 4.3 g/dL (ref 3.5–5.0)
ALK PHOS: 79 U/L (ref 38–126)
ALT: 18 U/L (ref 14–54)
AST: 28 U/L (ref 15–41)
Anion gap: 7 (ref 5–15)
BILIRUBIN TOTAL: 0.4 mg/dL (ref 0.3–1.2)
BUN: 12 mg/dL (ref 6–20)
CALCIUM: 8.8 mg/dL — AB (ref 8.9–10.3)
CO2: 29 mmol/L (ref 22–32)
Chloride: 101 mmol/L (ref 101–111)
Creatinine, Ser: 0.74 mg/dL (ref 0.44–1.00)
GFR calc Af Amer: >60 mL/min (ref >60)
GFR calc non Af Amer: >60 mL/min (ref >60)
Glucose, Bld: 105 mg/dL — ABNORMAL HIGH (ref 65–99)
Potassium: 3.6 mmol/L (ref 3.5–5.1)
SODIUM: 137 mmol/L (ref 135–145)
Total Protein: 8 g/dL (ref 6.5–8.1)

## 2014-08-08 LAB — CBC
HCT: 40.1 % (ref 35.0–47.0)
Hemoglobin: 13.2 g/dL (ref 12.0–16.0)
MCH: 30 pg (ref 26.0–34.0)
MCHC: 32.8 g/dL (ref 32.0–36.0)
MCV: 91.5 fL (ref 80.0–100.0)
PLATELETS: 297 10*3/uL (ref 150–440)
RBC: 4.38 MIL/uL (ref 3.80–5.20)
RDW: 13.1 % (ref 11.5–14.5)
WBC: 6.8 10*3/uL (ref 3.6–11.0)

## 2014-08-08 MED ORDER — LEVOFLOXACIN 500 MG PO TABS
500.0000 mg | ORAL_TABLET | Freq: Every day | ORAL | Status: DC
Start: 2014-08-08 — End: 2014-08-08

## 2014-08-08 MED ORDER — LEVOFLOXACIN 500 MG PO TABS: 500.0000 mg | ORAL_TABLET | Freq: Every day | ORAL | Status: DC

## 2014-08-08 NOTE — Progress Notes (Signed)
Patient does not have living will.  Former smoker. 

## 2014-08-12 ENCOUNTER — Telehealth: Payer: Self-pay | Admitting: General Surgery

## 2014-08-12 NOTE — Telephone Encounter (Signed)
PT CALLED TODAY & STATED SHE TRIED TO [facility] HERSELF A MAMMO @ [place].THE EXPLAINED SHE WAS UNABLE TO WITHOUT AN ORDER FROM MD.SHE STATES SHE HAS NOT HAD ONE IN 2 YRS (SINCE 2014) ON YOUR LAST NOTE IN JAN 2016 PLANS WHERE TO SEE HER BACK IN 6 MONS. DO WE NEED TO [facility] [place] A MAMMO FOR PT? IF SO BILAT/UNI SCREENING/DX/MTH

## 2014-08-17 NOTE — Telephone Encounter (Signed)
She can come in for an OV, and then we can decide about the mammogram.

## 2014-08-19 ENCOUNTER — Encounter: Payer: Self-pay | Admitting: Oncology

## 2014-08-19 NOTE — Progress Notes (Signed)
[place] @ [facility] [phone]  Fax:([phone]) [place] OB: Oct 18, 1933  MR#: [id number]  KPT#:[id number]  Patient Care Team: Idelle Crouch, MD as PCP - General (Internal Medicine) Robert Bellow, MD (General Surgery) Idelle Crouch, MD (Internal Medicine)  CHIEF COMPLAINT:  Chief Complaint  Patient presents with  . Follow-up    Oncology History   Chief Complaint/Problem List:  Carcinoma of lung, left upper lobe. Status post wedge resection in February 2006. 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  AJCC Staging: pT2N2M0 Stage Grouping: IIIA HPI:   81 year old lady with previous history of carcinoma of lung status post resection.  T1 N2 M0 tumo     Cancer of lung   07/24/2013 Initial Diagnosis Cancer of lung    No flowsheet data found.  INTERVAL HISTORY: 81 year old lady came today further follow-up regarding carcinoma of lung.  Patient had resection and chemotherapy.  He second continues to be very anxious about her diagnosis.  No chest pain no cough no shortness of breath.  Patient has quit smoking.  No hemoptysis.  REVIEW OF SYSTEMS:   GENERAL:  Feels good.  Active.  No fevers, sweats or weight loss. PERFORMANCE STATUS (ECOG): 01 HEENT:  No visual changes, runny nose, sore throat, mouth sores or tenderness. Lungs: No shortness of breath or cough.  No hemoptysis. Cardiac:  No chest pain, palpitations, orthopnea, or PND. GI:  No nausea, vomiting, diarrhea, constipation, melena or hematochezia. GU:  No urgency, frequency, dysuria, or hematuria. Musculoskeletal:  No back pain.  No joint pain.  No muscle tenderness. Extremities:  No pain or swelling. Skin:  No rashes or skin changes. Neuro:  No headache, numbness or weakness, balance or coordination issues. Endocrine:  No diabetes, thyroid issues, hot flashes or night sweats. Psych:  No mood changes, depression or anxiety. Pain:  No focal pain. Review  of systems:  All other systems reviewed and found to be negative. As per HPI. Otherwise, a complete review of systems is negatve.  PAST MEDICAL HISTORY: Past Medical History  Diagnosis Date  . Hyperlipidemia   . Lung disease   . GERD (gastroesophageal reflux disease)   . Cancer     lung  . Shortness of breath   . Ovarian cyst 07/30/2014    PAST SURGICAL HISTORY: Past Surgical History  Procedure Laterality Date  . Cholecystectomy  2014  . Lung surgery  2007    upper left lobe  . Tonsillectomy      FAMILY HISTORY Family History  Problem Relation Age of Onset  . Cancer Daughter     breast    ADVANCED DIRECTIVES:  Patient does not have living will.  Information regarding living will was given HEALTH MAINTENANCE: History  Substance Use Topics  . Smoking status: Never Smoker   . Smokeless tobacco: Never Used  . Alcohol Use: Yes      Allergies  Allergen Reactions  . Morphine And Related Other (See Comments)    halluncinations  . Sulfonamide Derivatives   . Tetanus Toxoid     Current Outpatient Prescriptions  Medication Sig Dispense Refill  . ADVAIR DISKUS 250-50 MCG/DOSE AEPB Inhale 1 puff into the lungs daily.     Marland Kitchen aspirin 81 MG tablet Take 81 mg by mouth daily.    . Calcium Carb-Cholecalciferol (CALCIUM 600 + D PO) Take 1 tablet by mouth daily.    Marland Kitchen levalbuterol (XOPENEX HFA) 45 MCG/ACT inhaler Inhale into the lungs  every 4 (four) hours as needed for wheezing.    Marland Kitchen LORazepam (ATIVAN) 1 MG tablet Take 0.5 mg by mouth at bedtime.     . mirtazapine (REMERON) 30 MG tablet Take 1 tablet by mouth daily.    . Multiple Vitamin (MULTIVITAMIN) tablet Take 1 tablet by mouth daily.    Marland Kitchen omeprazole (PRILOSEC) 40 MG capsule Take 1 capsule by mouth daily.    . OXYGEN Inhale into the lungs at bedtime.    . rosuvastatin (CRESTOR) 20 MG tablet Take 20 mg by mouth daily.    Marland Kitchen SPIRIVA HANDIHALER 18 MCG inhalation capsule Place 18 mcg into inhaler and inhale daily.     Marland Kitchen  levofloxacin (LEVAQUIN) 500 MG tablet Take 1 tablet (500 mg total) by mouth daily. 7 tablet 0   No current facility-administered medications for this visit.    OBJECTIVE:  Filed Vitals:   08/08/14 1603  BP: 121/77  Pulse: 83  Temp: 98 F (36.7 C)     Body mass index is 21.2 kg/(m^2).    ECOG FS:1 - Symptomatic but completely ambulatory  PHYSICAL EXAM: GENERAL:  Well developed, well nourished, sitting comfortably in the exam room in no acute distress. Very apprehensive but not in any acute distress MENTAL STATUS:  Alert and oriented to person, place and time. HEAD:    Normocephalic, atraumatic, face symmetric, no Cushingoid features. EYES:  Pupils equal round and reactive to light and accomodation.  No conjunctivitis or scleral icterus. ENT:  Oropharynx clear without lesion.  Tongue normal. Mucous membranes moist.  RESPIRATORY:  Clear to auscultation without rales, wheezes or rhonchi.   Diminished air entry on both sides.  Emphysematous chest CARDIOVASCULAR:  Regular rate and rhythm without murmur, rub or gallop. . ABDOMEN:  Soft, non-tender, with active bowel sounds, and no hepatosplenomegaly.  No masses. BACK:  No CVA tenderness.  No tenderness on percussion of the back or rib cage. SKIN:  No rashes, ulcers or lesions. EXTREMITIES: No edema, no skin discoloration or tenderness.  No palpable cords. LYMPH NODES: No palpable cervical, supraclavicular, axillary or inguinal adenopathy  NEUROLOGICAL: Unremarkable. PSYCH:  Appropriate.   LAB RESULTS:  Clinical Support on 08/08/2014  Component Date Value Ref Range Status  . WBC 08/08/2014 6.8  3.6 - 11.0 K/uL Final  . RBC 08/08/2014 4.38  3.80 - 5.20 MIL/uL Final  . Hemoglobin 08/08/2014 13.2  12.0 - 16.0 g/dL Final  . HCT 08/08/2014 40.1  35.0 - 47.0 % Final  . MCV 08/08/2014 91.5  80.0 - 100.0 fL Final  . MCH 08/08/2014 30.0  26.0 - 34.0 pg Final  . MCHC 08/08/2014 32.8  32.0 - 36.0 g/dL Final  . RDW 08/08/2014 13.1  11.5 -  14.5 % Final  . Platelets 08/08/2014 297  150 - 440 K/uL Final  . Sodium 08/08/2014 137  135 - 145 mmol/L Final  . Potassium 08/08/2014 3.6  3.5 - 5.1 mmol/L Final  . Chloride 08/08/2014 101  101 - 111 mmol/L Final  . CO2 08/08/2014 29  22 - 32 mmol/L Final  . Glucose, Bld 08/08/2014 105* 65 - 99 mg/dL Final  . BUN 08/08/2014 12  6 - 20 mg/dL Final  . Creatinine, Ser 08/08/2014 0.74  0.44 - 1.00 mg/dL Final  . Calcium 08/08/2014 8.8* 8.9 - 10.3 mg/dL Final  . Total Protein 08/08/2014 8.0  6.5 - 8.1 g/dL Final  . Albumin 08/08/2014 4.3  3.5 - 5.0 g/dL Final  . AST 08/08/2014 28  15 -  41 U/L Final  . ALT 08/08/2014 18  14 - 54 U/L Final  . Alkaline Phosphatase 08/08/2014 79  38 - 126 U/L Final  . Total Bilirubin 08/08/2014 0.4  0.3 - 1.2 mg/dL Final  . GFR calc non Af Amer 08/08/2014 >60  >60 mL/min Final  . GFR calc Af Amer 08/08/2014 >60  >60 mL/min Final   Comment: (NOTE) The eGFR has been calculated using the CKD EPI equation. This calculation has not been validated in all clinical situations. eGFR's persistently <60 mL/min signify possible Chronic Kidney Disease.   . Anion gap 08/08/2014 7  5 - 15 Final        ASSESSMENT: Carcinoma of lung there is no evidence of recurrent or progressive disease at present time  MEDICAL DECISION MAKING:  All lab data has been reviewed.  No abnormality detected.   routineCT scan would be done in October 2016 without contrast for follow-up regarding previous abnormality  Patient expressed understanding and was in agreement with this plan. She also understands that She can call clinic at any time with any questions, concerns, or complaints.    No matching staging information was found for the patient.  Forest Gleason, MD   08/19/2014 5:08 PM

## 2014-09-03 ENCOUNTER — Other Ambulatory Visit: Payer: PPO

## 2014-09-03 ENCOUNTER — Ambulatory Visit (INDEPENDENT_AMBULATORY_CARE_PROVIDER_SITE_OTHER): Payer: PPO | Admitting: General Surgery

## 2014-09-03 ENCOUNTER — Encounter: Payer: Self-pay | Admitting: General Surgery

## 2014-09-03 VITALS — BP 112/70 | HR 74 | Resp 18 | Ht 62.0 in | Wt 114.0 lb

## 2014-09-03 DIAGNOSIS — N6452 Nipple discharge: Secondary | ICD-10-CM

## 2014-09-03 DIAGNOSIS — D242 Benign neoplasm of left breast: Secondary | ICD-10-CM | POA: Diagnosis not present

## 2014-09-03 NOTE — Progress Notes (Signed)
Patient ID: [name], female   DOB: Oct 26, 1933, 81 y.o.   MRN: [id number]  No chief complaint on file.   HPI CHIQUITTA [name] is a 81 y.o. female.  Here today for follow up from left nipple discharge. She states she still sees the nipple discharge occasional, about 1-2 times a week but no different than before. States it is yellowish brown in color. Denies breast pain. She has not had a mammogram since 2014.  She is here today with her daughter, [name]. She has an appointment for an ultrasound and Dr. Theora Gianotti for an ovarian growth.  HPI  Past Medical History  Diagnosis Date  . Hyperlipidemia   . Lung disease   . GERD (gastroesophageal reflux disease)   . Cancer     lung  . Shortness of breath   . Ovarian cyst 07/30/2014    Past Surgical History  Procedure Laterality Date  . Cholecystectomy  2014  . Lung surgery  2007    upper left lobe  . Tonsillectomy      Family History  Problem Relation Age of Onset  . Cancer Daughter     breast    Social History History  Substance Use Topics  . Smoking status: Never Smoker   . Smokeless tobacco: Never Used  . Alcohol Use: Yes    Allergies  Allergen Reactions  . Morphine And Related Other (See Comments)    halluncinations  . Sulfonamide Derivatives   . Tetanus Toxoid     Current Outpatient Prescriptions  Medication Sig Dispense Refill  . ADVAIR DISKUS 250-50 MCG/DOSE AEPB Inhale 1 puff into the lungs daily.     Marland Kitchen aspirin 81 MG tablet Take 81 mg by mouth daily.    . Calcium Carb-Cholecalciferol (CALCIUM 600 + D PO) Take 1 tablet by mouth daily.    Marland Kitchen levalbuterol (XOPENEX HFA) 45 MCG/ACT inhaler Inhale into the lungs every 4 (four) hours as needed for wheezing.    Marland Kitchen LORazepam (ATIVAN) 1 MG tablet Take 0.5 mg by mouth at bedtime.     . mirtazapine (REMERON) 30 MG tablet Take 1 tablet by mouth daily.    . Multiple Vitamin (MULTIVITAMIN) tablet Take 1 tablet by mouth daily.    Marland Kitchen omeprazole (PRILOSEC) 40 MG capsule Take 1  capsule by mouth daily.    . OXYGEN Inhale into the lungs at bedtime.    . rosuvastatin (CRESTOR) 20 MG tablet Take 20 mg by mouth daily.    Marland Kitchen SPIRIVA HANDIHALER 18 MCG inhalation capsule Place 18 mcg into inhaler and inhale daily.      No current facility-administered medications for this visit.    Review of Systems Review of Systems  Constitutional: Negative.   Respiratory: Negative.   Cardiovascular: Negative.     Blood pressure 112/70, pulse 74, resp. rate 18, height '5\' 2"'$  (1.575 m), weight 114 lb (51.71 kg).  Physical Exam Physical Exam  Constitutional: She is oriented to person, place, and time. She appears well-developed and well-nourished.  HENT:  Mouth/Throat: Oropharynx is clear and moist.  Eyes: Conjunctivae are normal. No scleral icterus.  Neck: Neck supple.  Cardiovascular: Normal rate, regular rhythm and normal heart sounds.   Pulmonary/Chest: Effort normal and breath sounds normal. Right breast exhibits no inverted nipple, no mass, no nipple discharge, no skin change and no tenderness. Left breast exhibits no inverted nipple, no mass, no nipple discharge, no skin change and no tenderness.  Thickening left breast at 2 o'clock 3 CFN. Single drop  duct drainage left nipple.  Lymphadenopathy:    She has no cervical adenopathy.  Neurological: She is alert and oriented to person, place, and time.  Skin: Skin is warm and dry.  Psychiatric: She has a normal mood and affect.    Data Reviewed 09/04/2013 pelvic ultrasound showed a septated left adnexal cyst unchanged from March 2012. This measured 3.3 cm in greatest dimension.  Ultrasound examination of the left breast was completed to reassess the previously identified papilloma. In the 2:00 position, 3 cm from the nipple a 0.4 x 0.5 x 0.55 cm hypoechoic smoothly marginated nodule without direct ductal connection was identified. At the 1 cm mark, 2:00 position a mildly prominent duct measuring up to 0.33 cm was noted. An  intraductal lesion was not clearly identified. BI-RADS-2.  Assessment    Benign breast exam with persistent nipple drainage, minimally symptomatic.    Plan    At the time of the patient's January 2016 visit one consideration was being given to excision of the papilloma, she reported being too ill to consider any surgical intervention. She's feeling better now, but the single drop of drainage twice a week may not be enough for her to consider excision of the papilloma.  The patient's general health is somewhat fragile, but this procedure could be done under local anesthesia with sedation with minimal morbidity or pain.  Prior to considering surgical intervention, we'll obtain a repeat mammogram to see if any new pathology is identified.  She is scheduled to see the GYN oncologist in August with a repeat pelvic ultrasound. We'll hold any decision for surgical intervention until after that visit. If she was going to be a candidate for general anesthesia in regards to this left ovary ( unlikely with no interval change between 2012 and 2015) sees the left breast could be dealt with at that time without any additional morbidity from anesthesia). More likely, the patient will not need any GYN intervention, and she and her daughter will discuss how troublesome the breast discharges with the low likelihood of any malignant process.       Discussed risk and benefits of surgery. Follow up surveillance mammogram prior to surgery.  PCP:  [place] Re[place] Dr. Oliva Bustard Dr. Hadley Pen, Forest Gleason 09/03/2014, 8:24 PM

## 2014-09-03 NOTE — Patient Instructions (Signed)
Continue self breast exams. Call office for any new breast issues or concerns. 

## 2014-09-04 ENCOUNTER — Ambulatory Visit
Admission: RE | Admit: 2014-09-04 | Discharge: 2014-09-04 | Disposition: A | Discharge status: Home / Self Care | Payer: PPO | Source: Ambulatory Visit | Attending: Family Medicine | Admitting: Family Medicine

## 2014-09-04 ENCOUNTER — Inpatient Hospital Stay: Payer: PPO

## 2014-09-04 ENCOUNTER — Ambulatory Visit: Payer: Self-pay

## 2014-09-04 DIAGNOSIS — N8320 Unspecified ovarian cysts: Secondary | ICD-10-CM | POA: Insufficient documentation

## 2014-09-04 DIAGNOSIS — N858 Other specified noninflammatory disorders of uterus: Secondary | ICD-10-CM | POA: Diagnosis not present

## 2014-09-04 DIAGNOSIS — N83202 Unspecified ovarian cyst, left side: Secondary | ICD-10-CM

## 2014-09-06 ENCOUNTER — Ambulatory Visit: Payer: PPO

## 2014-09-06 ENCOUNTER — Other Ambulatory Visit: Payer: Self-pay | Admitting: General Surgery

## 2014-09-06 ENCOUNTER — Ambulatory Visit
Admission: RE | Admit: 2014-09-06 | Discharge: 2014-09-06 | Disposition: A | Discharge status: Home / Self Care | Payer: PPO | Source: Ambulatory Visit | Attending: General Surgery | Admitting: General Surgery

## 2014-09-06 DIAGNOSIS — D242 Benign neoplasm of left breast: Secondary | ICD-10-CM

## 2014-09-06 DIAGNOSIS — N6452 Nipple discharge: Secondary | ICD-10-CM

## 2014-09-11 ENCOUNTER — Ambulatory Visit: Payer: Self-pay | Admitting: Oncology

## 2014-09-11 ENCOUNTER — Other Ambulatory Visit: Payer: Self-pay

## 2014-09-11 ENCOUNTER — Ambulatory Visit: Payer: Self-pay

## 2014-09-12 ENCOUNTER — Telehealth: Payer: Self-pay | Admitting: *Deleted

## 2014-09-12 NOTE — Telephone Encounter (Signed)
Patient left msg with cancer center medical records inquiring about u/s results.  Call returned on 09/12/14. No answer left msg that call was returned. Left with cancer center's phone number for patient to call back.

## 2014-10-02 ENCOUNTER — Inpatient Hospital Stay: Payer: PPO | Attending: Obstetrics and Gynecology | Admitting: Obstetrics and Gynecology

## 2014-10-02 VITALS — BP 118/79 | HR 92 | Temp 99.4°F | Resp 20 | Wt 114.0 lb

## 2014-10-02 DIAGNOSIS — J45909 Unspecified asthma, uncomplicated: Secondary | ICD-10-CM | POA: Insufficient documentation

## 2014-10-02 DIAGNOSIS — F329 Major depressive disorder, single episode, unspecified: Secondary | ICD-10-CM | POA: Insufficient documentation

## 2014-10-02 DIAGNOSIS — F419 Anxiety disorder, unspecified: Secondary | ICD-10-CM | POA: Diagnosis not present

## 2014-10-02 DIAGNOSIS — E785 Hyperlipidemia, unspecified: Secondary | ICD-10-CM | POA: Diagnosis not present

## 2014-10-02 DIAGNOSIS — K219 Gastro-esophageal reflux disease without esophagitis: Secondary | ICD-10-CM | POA: Diagnosis not present

## 2014-10-02 DIAGNOSIS — Z85118 Personal history of other malignant neoplasm of bronchus and lung: Secondary | ICD-10-CM | POA: Insufficient documentation

## 2014-10-02 DIAGNOSIS — Z79899 Other long term (current) drug therapy: Secondary | ICD-10-CM | POA: Diagnosis present

## 2014-10-02 DIAGNOSIS — N83209 Unspecified ovarian cyst, unspecified side: Secondary | ICD-10-CM

## 2014-10-02 DIAGNOSIS — N8320 Unspecified ovarian cysts: Secondary | ICD-10-CM | POA: Insufficient documentation

## 2014-10-02 DIAGNOSIS — Z7982 Long term (current) use of aspirin: Secondary | ICD-10-CM | POA: Diagnosis present

## 2014-10-02 NOTE — Progress Notes (Signed)
Gynecologic Oncology Consult Visit   Referring Provider: Dr. Oliva Bustard.   Chief Concern: Ovarian cyst  Subjective:  [name] is a 81 y.o. female who is seen in consultation from Dr.  Oliva Bustard for continued evaluation regarding an ovarian cyst.   Pelvic ultrasound 09/04/2014 FINDINGS: Uterus  Measurements: 3.8 x 1.6 x 3.0 cm. Normal morphology without mass. Few tiny nonspecific myometrial calcifications.  Endometrium  Thickness: 1 mm, normal. No endometrial fluid or focal abnormality  Right ovary  Measurements: 1.1 x 1.9 x 1.4 cm. Normal morphology without mass.  Left ovary  Measurements: 1.7 x 3.1 x 3.5 cm. Two adjacent small cystic lesions measuring 1.5 x 1.4 x 1.1 cm (previously 1.9 x 1.9 x 1.6 cm) and 1.6 x 1.5 x 1.7 cm (previously 1.1 x 1.0 x 1.3 cm). No complicating Features.  Lab Results  Component Value Date   CA125 27.7 09/04/2013    Gynecologic Oncology History   03/2010  CT scan reveals a 2.9 cm probably cystic enlargement of the L ovary. She has been followed by Dr. Sabra Heck since 2014. Her CA125 values have been normal.   Problem List: Patient Active Problem List   Diagnosis Date Noted  . Ovarian cyst 07/30/2014  . Nipple discharge 02/15/2014  . Anxiety and depression 07/24/2013  . Arterial vascular disease 07/24/2013  . Insomnia, persistent 07/24/2013  . Acid reflux 07/24/2013  . HLD (hyperlipidemia) 07/24/2013  . Cancer of lung 07/24/2013  . Peripheral blood vessel disorder 07/24/2013  . Papilloma of left breast 11/22/2012  . CAFL (chronic airflow limitation) 03/09/2012  . ALLERGIC RHINITIS 06/01/2007  . EMPHYSEMA 06/01/2007  . ASTHMA 06/01/2007  . DYSPNEA 06/01/2007    Past Medical History: Past Medical History  Diagnosis Date  . Hyperlipidemia   . Lung disease   . GERD (gastroesophageal reflux disease)   . Shortness of breath   . Ovarian cyst 07/30/2014  . Cancer     lung, chemo    Past Surgical History: Past Surgical  History  Procedure Laterality Date  . Cholecystectomy  2014  . Lung surgery  2007    upper left lobe  . Tonsillectomy    . Breast biopsy Left 11-2012    neg., papilloma    Past Gynecologic History:  See HPI  OB History:  OB History  Gravida Para Term Preterm AB SAB TAB Ectopic Multiple Living  '2 1        1    '$ # Outcome Date GA Lbr Len/2nd Weight Sex Delivery Anes PTL Lv  2 Gravida           1 Para             Obstetric Comments  1st Menstrual Cycle: 14  1st Pregnancy: 42    Family History: Family History  Problem Relation Age of Onset  . Cancer Daughter     breast  . Breast cancer Daughter 66    Social History: Social History   Social History  . Marital Status: Widowed    Spouse Name: N/A  . Number of Children: N/A  . Years of Education: N/A   Occupational History  . Not on file.   Social History Main Topics  . Smoking status: Former Smoker -- 1.50 packs/day for 40 years    Types: Cigarettes    Quit date: 10/01/2001  . Smokeless tobacco: Former Systems developer  . Alcohol Use: Yes     Comment: socially  . Drug Use: No  . Sexual Activity: Not Currently  Other Topics Concern  . Not on file   Social History Narrative    Allergies: Allergies  Allergen Reactions  . Morphine And Related Other (See Comments)    halluncinations  . Sulfonamide Derivatives   . Tetanus Toxoid     Current Medications: Current Outpatient Prescriptions  Medication Sig Dispense Refill  . ADVAIR DISKUS 250-50 MCG/DOSE AEPB Inhale 1 puff into the lungs daily.     Marland Kitchen aspirin 81 MG tablet Take 81 mg by mouth daily.    . Calcium Carb-Cholecalciferol (CALCIUM 600 + D PO) Take 1 tablet by mouth daily.    . cholecalciferol (VITAMIN D) 1000 UNITS tablet Take 1,000 Units by mouth daily.    Marland Kitchen levalbuterol (XOPENEX HFA) 45 MCG/ACT inhaler Inhale into the lungs every 4 (four) hours as needed for wheezing.    Marland Kitchen LORazepam (ATIVAN) 1 MG tablet Take 0.5 mg by mouth at bedtime.     . mirtazapine  (REMERON) 30 MG tablet Take 1 tablet by mouth daily.    . Multiple Vitamin (MULTIVITAMIN) tablet Take 1 tablet by mouth daily.    Marland Kitchen omeprazole (PRILOSEC) 40 MG capsule Take 1 capsule by mouth daily.    . OXYGEN Inhale into the lungs at bedtime.    . rosuvastatin (CRESTOR) 20 MG tablet Take 20 mg by mouth daily.    Marland Kitchen SPIRIVA HANDIHALER 18 MCG inhalation capsule Place 18 mcg into inhaler and inhale daily.      No current facility-administered medications for this visit.    Review of Systems General: She feels tired, weak  HEENT: no complaints  Lungs: SOB and cough  Cardiac: no complaints  GI: longstanding abdominal bloating and gas. No change in symptoms. No pain or difficulty with po intakes. No diarrhea/constipation/n/v  GU: no complaints  Musculoskeletal: no complaints  Extremities: no complaints  Skin: no complaints  Neuro: no complaints  Endocrine: no complaints  Psych: no complaints       Objective:  Physical Examination:  BP 118/79 mmHg  Pulse 92  Temp(Src) 99.4 F (37.4 C) (Tympanic)  Resp 20  Wt 113 lb 15.7 oz (51.7 kg)   ECOG Performance Status: 1 - Symptomatic but completely ambulatory  General appearance: alert, cooperative and appears stated age HEENT:PERRLA and sclera clear, anicteric Lymph node survey: non-palpable, inguinal Abdomen: soft, non-tender, without masses or organomegaly, no hernias and well healed incision Extremities: extremities normal, atraumatic, no cyanosis or edema Neurological exam reveals alert, oriented, normal speech, no focal findings or movement disorder noted.  Pelvic: exam chaperoned by nurse;  Vulva: normal appearing vulva with no masses, tenderness or lesions; Vagina: normal vagina, atrophy; Adnexa: no masses; Uterus: uterus is so small it is not palpable, nontender; Cervix: nulliparous appearance; Rectal: not indicated    Lab Review Labs on site today: None today  Radiologic Imaging: I personally reviewed the images with  the patient and her daughter.     Assessment:  [name] is a 81 y.o. female diagnosed with asymptomatic persistent ovarian cysts that have been relatively stable.  Plan:   Problem List Items Addressed This Visit      Genitourinary   Ovarian cyst - Primary   Relevant Orders   US Transvaginal Non-OB     Mrs. Leist will continue to  be followed clinically with another exam in 12  Months as well as pelvic ultrasound. She will let us know, if there are new complaints and will keep follow-up with Dr. Oliva Bustard.   She can review her  other ROS with her PCP. Overall she looks well and is clinically stable.   Gillis Ends, MD    CC:  Dr. Louretta Parma, MD [place], [place] [address] [phone]

## 2014-10-08 ENCOUNTER — Other Ambulatory Visit: Payer: Self-pay | Admitting: *Deleted

## 2014-10-08 DIAGNOSIS — N83202 Unspecified ovarian cyst, left side: Secondary | ICD-10-CM

## 2014-10-18 ENCOUNTER — Institutional Professional Consult (permissible substitution): Payer: PPO | Admitting: Internal Medicine

## 2014-12-03 ENCOUNTER — Ambulatory Visit
Admission: RE | Admit: 2014-12-03 | Discharge: 2014-12-03 | Disposition: A | Discharge status: Home / Self Care | Payer: PPO | Source: Ambulatory Visit | Attending: Oncology | Admitting: Oncology

## 2014-12-03 DIAGNOSIS — C3490 Malignant neoplasm of unspecified part of unspecified bronchus or lung: Secondary | ICD-10-CM | POA: Insufficient documentation

## 2014-12-03 DIAGNOSIS — R918 Other nonspecific abnormal finding of lung field: Secondary | ICD-10-CM | POA: Diagnosis not present

## 2014-12-05 ENCOUNTER — Ambulatory Visit: Payer: PPO | Admitting: Oncology

## 2014-12-05 ENCOUNTER — Other Ambulatory Visit: Payer: PPO

## 2014-12-11 ENCOUNTER — Inpatient Hospital Stay: Payer: PPO | Attending: Oncology | Admitting: Oncology

## 2014-12-11 ENCOUNTER — Inpatient Hospital Stay: Payer: PPO

## 2014-12-11 VITALS — BP 125/80 | HR 83 | Temp 96.7°F | Wt 109.3 lb

## 2014-12-11 DIAGNOSIS — K219 Gastro-esophageal reflux disease without esophagitis: Secondary | ICD-10-CM | POA: Insufficient documentation

## 2014-12-11 DIAGNOSIS — Z87891 Personal history of nicotine dependence: Secondary | ICD-10-CM | POA: Diagnosis not present

## 2014-12-11 DIAGNOSIS — R0602 Shortness of breath: Secondary | ICD-10-CM | POA: Diagnosis not present

## 2014-12-11 DIAGNOSIS — Z7982 Long term (current) use of aspirin: Secondary | ICD-10-CM | POA: Insufficient documentation

## 2014-12-11 DIAGNOSIS — Z79899 Other long term (current) drug therapy: Secondary | ICD-10-CM | POA: Insufficient documentation

## 2014-12-11 DIAGNOSIS — R062 Wheezing: Secondary | ICD-10-CM | POA: Insufficient documentation

## 2014-12-11 DIAGNOSIS — C3490 Malignant neoplasm of unspecified part of unspecified bronchus or lung: Secondary | ICD-10-CM

## 2014-12-11 DIAGNOSIS — E785 Hyperlipidemia, unspecified: Secondary | ICD-10-CM | POA: Insufficient documentation

## 2014-12-11 DIAGNOSIS — N83209 Unspecified ovarian cyst, unspecified side: Secondary | ICD-10-CM | POA: Insufficient documentation

## 2014-12-11 DIAGNOSIS — Z85118 Personal history of other malignant neoplasm of bronchus and lung: Secondary | ICD-10-CM | POA: Diagnosis not present

## 2014-12-11 LAB — CBC WITH DIFFERENTIAL/PLATELET
BASOS PCT: 1 %
Basophils Absolute: 0 10*3/uL (ref 0–0.1)
Eosinophils Absolute: 0.2 10*3/uL (ref 0–0.7)
Eosinophils Relative: 2 %
HEMATOCRIT: 41.6 % (ref 35.0–47.0)
HEMOGLOBIN: 13.9 g/dL (ref 12.0–16.0)
LYMPHS ABS: 2.9 10*3/uL (ref 1.0–3.6)
Lymphocytes Relative: 40 %
MCH: 29.9 pg (ref 26.0–34.0)
MCHC: 33.5 g/dL (ref 32.0–36.0)
MCV: 89.2 fL (ref 80.0–100.0)
MONOS PCT: 6 %
Monocytes Absolute: 0.5 10*3/uL (ref 0.2–0.9)
NEUTROS PCT: 51 %
Neutro Abs: 3.7 10*3/uL (ref 1.4–6.5)
Platelets: 397 10*3/uL (ref 150–440)
RBC: 4.66 MIL/uL (ref 3.80–5.20)
RDW: 13.6 % (ref 11.5–14.5)
WBC: 7.3 10*3/uL (ref 3.6–11.0)

## 2014-12-11 LAB — COMPREHENSIVE METABOLIC PANEL
ALBUMIN: 4.2 g/dL (ref 3.5–5.0)
ALK PHOS: 51 U/L (ref 38–126)
ALT: 16 U/L (ref 14–54)
AST: 17 U/L (ref 15–41)
Anion gap: 6 (ref 5–15)
BUN: 10 mg/dL (ref 6–20)
CO2: 27 mmol/L (ref 22–32)
CREATININE: 0.75 mg/dL (ref 0.44–1.00)
Calcium: 8.7 mg/dL — ABNORMAL LOW (ref 8.9–10.3)
Chloride: 101 mmol/L (ref 101–111)
GFR calc Af Amer: >60 mL/min (ref >60)
GFR calc non Af Amer: >60 mL/min (ref >60)
GLUCOSE: 97 mg/dL (ref 65–99)
Potassium: 4.1 mmol/L (ref 3.5–5.1)
SODIUM: 134 mmol/L — AB (ref 135–145)
Total Bilirubin: 0.5 mg/dL (ref 0.3–1.2)
Total Protein: 7.3 g/dL (ref 6.5–8.1)

## 2014-12-11 NOTE — Progress Notes (Signed)
   12/11/14 1000  Clinical Encounter Type  Visited With Patient and family together  Visit Type Initial  Consult/Referral To Chaplain  Provided pastoral support and presence to patient and family member in the cancer center. [place] [phone]

## 2014-12-11 NOTE — Progress Notes (Signed)
[place]  [phone]  [phone]     [name] DOB: [birth year]  MR#: [id number]  SEG#:[id number]  Patient Care Team: Idelle Crouch, MD as PCP - General (Internal Medicine) Robert Bellow, MD (General Surgery) Idelle Crouch, MD (Internal Medicine)  CHIEF COMPLAINT:  Chief Complaint  Patient presents with  . Results   Patient is here to review results of recent CT scan regarding left upper lobe lung cancer, status post wedge resection in February 2006.  INTERVAL HISTORY: Patient is here for continued follow-up and treatment and evaluation regarding left upper lobe lung cancer. She is status post wedge resection in February 2006, T1 N2 M0 disease. She overall reports feeling fairly well. She is having some wheezing and occasional shortness of breath. She continues to use oxygen at night. She reports having a rescue inhaler but never uses it. She has an upcoming appointment with new pulmonologist, Dr. Stevenson Clinch. Patient also reports having a decreased appetite. Her primary care, Dr. Doy Hutching, started her on Megace 40 mg daily which initially helped, she then stopped as her appetite was improved. Her appetite decreased again after stopping Megace so she restarted and has had some improvement.  REVIEW OF SYSTEMS:   Review of Systems  Constitutional: Negative for fever, chills, weight loss, malaise/fatigue and diaphoresis.       Poor appetite, currently on Megace from PCP  HENT: Negative for congestion, ear discharge, ear pain, hearing loss, nosebleeds, sore throat and tinnitus.   Eyes: Negative for blurred vision, double vision, photophobia, pain, discharge and redness.  Respiratory: Positive for sputum production, shortness of breath and wheezing. Negative for cough, hemoptysis and stridor.        Intermittently  Cardiovascular: Negative for chest pain, palpitations, orthopnea, claudication, leg swelling and PND.  Gastrointestinal: Negative  for heartburn, nausea, vomiting, abdominal pain, diarrhea, constipation, blood in stool and melena.  Genitourinary: Negative.   Musculoskeletal: Negative.   Skin: Negative.   Neurological: Negative for dizziness, tingling, focal weakness, seizures, weakness and headaches.  Endo/Heme/Allergies: Does not bruise/bleed easily.  Psychiatric/Behavioral: Negative for depression. The patient is not nervous/anxious and does not have insomnia.     As per HPI. Otherwise, a complete review of systems is negatve.  ONCOLOGY HISTORY: Oncology History   Chief Complaint/Problem List:  Carcinoma of lung, left upper lobe. Status post wedge resection in February 2006. 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  AJCC Staging: pT2N2M0 Stage Grouping: IIIA HPI:   81 year old lady with previous history of carcinoma of lung status post resection.  T1 N2 M0 tumo     Cancer of lung ([place])   07/24/2013 Initial Diagnosis Cancer of lung    PAST MEDICAL HISTORY: Past Medical History  Diagnosis Date  . Hyperlipidemia   . Lung disease   . GERD (gastroesophageal reflux disease)   . Shortness of breath   . Ovarian cyst 07/30/2014  . Cancer     lung, chemo    PAST SURGICAL HISTORY: Past Surgical History  Procedure Laterality Date  . Cholecystectomy  2014  . Lung surgery  2007    upper left lobe  . Tonsillectomy    . Breast biopsy Left 11-2012    neg., papilloma    FAMILY HISTORY Family History  Problem Relation Age of Onset  . Cancer Daughter     breast  . Breast cancer Daughter 54    GYNECOLOGIC HISTORY:  No LMP recorded. Patient is postmenopausal.  ADVANCED DIRECTIVES:    HEALTH MAINTENANCE: Social History  Substance Use Topics  . Smoking status: Former Smoker -- 1.50 packs/day for 40 years    Types: Cigarettes    Quit date: 10/01/2001  . Smokeless tobacco: Former Systems developer  . Alcohol Use: Yes     Comment: socially     Colonoscopy:  PAP:  Bone  density:  Lipid panel:  Allergies  Allergen Reactions  . Morphine And Related Other (See Comments)    halluncinations  . Sulfonamide Derivatives   . Tetanus Toxoid     Current Outpatient Prescriptions  Medication Sig Dispense Refill  . ADVAIR DISKUS 250-50 MCG/DOSE AEPB Inhale 1 puff into the lungs daily.     Marland Kitchen aspirin 81 MG tablet Take 81 mg by mouth daily.    . Calcium Carb-Cholecalciferol (CALCIUM 600 + D PO) Take 1 tablet by mouth daily.    . cholecalciferol (VITAMIN D) 1000 UNITS tablet Take 1,000 Units by mouth daily.    Marland Kitchen levalbuterol (XOPENEX HFA) 45 MCG/ACT inhaler Inhale into the lungs every 4 (four) hours as needed for wheezing.    Marland Kitchen LORazepam (ATIVAN) 1 MG tablet Take 0.5 mg by mouth at bedtime.     . mirtazapine (REMERON) 30 MG tablet Take 1 tablet by mouth daily.    . Multiple Vitamin (MULTIVITAMIN) tablet Take 1 tablet by mouth daily.    Marland Kitchen omeprazole (PRILOSEC) 40 MG capsule Take 1 capsule by mouth daily.    . OXYGEN Inhale into the lungs at bedtime.    . rosuvastatin (CRESTOR) 20 MG tablet Take 20 mg by mouth daily.    Marland Kitchen SPIRIVA HANDIHALER 18 MCG inhalation capsule Place 18 mcg into inhaler and inhale daily.      No current facility-administered medications for this visit.    OBJECTIVE: BP 125/80 mmHg  Pulse 83  Temp(Src) 96.7 F (35.9 C) (Tympanic)  Wt 109 lb 5.6 oz (49.6 kg)  SpO2 98%   Body mass index is 19.99 kg/(m^2).    ECOG FS:1 - Symptomatic but completely ambulatory  General: Well-developed, well-nourished, no acute distress. Eyes: Pink conjunctiva, anicteric sclera. HEENT: Normocephalic, moist mucous membranes, clear oropharnyx. Lungs:  inspiratory wheezing bilaterally. Heart: Regular rate and rhythm. No rubs, murmurs, or gallops. Abdomen: Soft, nontender, nondistended. No organomegaly noted, normoactive bowel sounds. Musculoskeletal: No edema, cyanosis, or clubbing. Neuro: Alert, answering all questions appropriately. Cranial nerves grossly  intact. Skin: No rashes or petechiae noted. Psych: Normal affect. Lymphatics: No cervical, clavicular, axillary LAD.   LAB RESULTS:  Appointment on 12/11/2014  Component Date Value Ref Range Status  . WBC 12/11/2014 7.3  3.6 - 11.0 K/uL Final  . RBC 12/11/2014 4.66  3.80 - 5.20 MIL/uL Final  . Hemoglobin 12/11/2014 13.9  12.0 - 16.0 g/dL Final  . HCT 12/11/2014 41.6  35.0 - 47.0 % Final  . MCV 12/11/2014 89.2  80.0 - 100.0 fL Final  . MCH 12/11/2014 29.9  26.0 - 34.0 pg Final  . MCHC 12/11/2014 33.5  32.0 - 36.0 g/dL Final  . RDW 12/11/2014 13.6  11.5 - 14.5 % Final  . Platelets 12/11/2014 397  150 - 440 K/uL Final  . Neutrophils Relative % 12/11/2014 51   Final  . Neutro Abs 12/11/2014 3.7  1.4 - 6.5 K/uL Final  . Lymphocytes Relative 12/11/2014 40   Final  . Lymphs Abs 12/11/2014 2.9  1.0 - 3.6 K/uL Final  . Monocytes Relative 12/11/2014 6   Final  . Monocytes Absolute 12/11/2014 0.5  0.2 - 0.9 K/uL Final  . Eosinophils Relative 12/11/2014 2   Final  . Eosinophils Absolute 12/11/2014 0.2  0 - 0.7 K/uL Final  . Basophils Relative 12/11/2014 1   Final  . Basophils Absolute 12/11/2014 0.0  0 - 0.1 K/uL Final  . Sodium 12/11/2014 134* 135 - 145 mmol/L Final  . Potassium 12/11/2014 4.1  3.5 - 5.1 mmol/L Final  . Chloride 12/11/2014 101  101 - 111 mmol/L Final  . CO2 12/11/2014 27  22 - 32 mmol/L Final  . Glucose, Bld 12/11/2014 97  65 - 99 mg/dL Final  . BUN 12/11/2014 10  6 - 20 mg/dL Final  . Creatinine, Ser 12/11/2014 0.75  0.44 - 1.00 mg/dL Final  . Calcium 12/11/2014 8.7* 8.9 - 10.3 mg/dL Final  . Total Protein 12/11/2014 7.3  6.5 - 8.1 g/dL Final  . Albumin 12/11/2014 4.2  3.5 - 5.0 g/dL Final  . AST 12/11/2014 17  15 - 41 U/L Final  . ALT 12/11/2014 16  14 - 54 U/L Final  . Alkaline Phosphatase 12/11/2014 51  38 - 126 U/L Final  . Total Bilirubin 12/11/2014 0.5  0.3 - 1.2 mg/dL Final  . GFR calc non Af Amer 12/11/2014 >60  >60 mL/min Final  . GFR calc Af Amer  12/11/2014 >60  >60 mL/min Final   Comment: (NOTE) The eGFR has been calculated using the CKD EPI equation. This calculation has not been validated in all clinical situations. eGFR's persistently <60 mL/min signify possible Chronic Kidney Disease.   . Anion gap 12/11/2014 6  5 - 15 Final    STUDIES: No results found.  ASSESSMENT:   Stage III a carcinoma of lung, left upper lobe.  PLAN:   1. Lung CA. Patient is status post wedge resection from 2006. She has recently had a CT scan on 12/03/2014, which has been reviewed independently, that shows no recurrent or progressive disease at this time. There are some stable areas of postinflammatory/infectious changes noted within the posterior right lower lobe that are similar to previous exam. Also noted to have no significant change and solid appearing nodules identified in both lungs which remain nonspecific.  Advised patient on proper use of his Ventolin inhaler when she is experiencing wheezing and shortness of breath. Also advised patient on using nebulizer and SVN for increasing shortness of breath.  Patient will return in approximately 3 months for continued follow-up.   Patient expressed understanding and was in agreement with this plan. She also understands that She can call clinic at any time with any questions, concerns, or complaints.   Dr. Oliva Bustard was available for consultation and review of plan of care for this patient.  Evlyn Kanner, NP   12/11/2014 11:23 AM

## 2014-12-11 NOTE — Progress Notes (Signed)
Patient here for CT results.  States she is having night sweats.

## 2014-12-18 ENCOUNTER — Encounter: Payer: Self-pay | Admitting: Internal Medicine

## 2014-12-18 ENCOUNTER — Ambulatory Visit (INDEPENDENT_AMBULATORY_CARE_PROVIDER_SITE_OTHER): Payer: PPO | Admitting: Internal Medicine

## 2014-12-18 VITALS — BP 118/72 | HR 86 | Ht 62.0 in | Wt 108.2 lb

## 2014-12-18 DIAGNOSIS — C3490 Malignant neoplasm of unspecified part of unspecified bronchus or lung: Secondary | ICD-10-CM

## 2014-12-18 DIAGNOSIS — J438 Other emphysema: Secondary | ICD-10-CM

## 2014-12-18 DIAGNOSIS — R0602 Shortness of breath: Secondary | ICD-10-CM | POA: Diagnosis not present

## 2014-12-18 DIAGNOSIS — J4489 Other specified chronic obstructive pulmonary disease: Secondary | ICD-10-CM

## 2014-12-18 DIAGNOSIS — J449 Chronic obstructive pulmonary disease, unspecified: Secondary | ICD-10-CM

## 2014-12-18 DIAGNOSIS — R918 Other nonspecific abnormal finding of lung field: Secondary | ICD-10-CM | POA: Insufficient documentation

## 2014-12-18 MED ORDER — FLUTICASONE FUROATE-VILANTEROL 200-25 MCG/INH IN AEPB: 1.0000 | INHALATION_SPRAY | Freq: Every day | RESPIRATORY_TRACT | Status: DC

## 2014-12-18 MED ORDER — FLUTICASONE FUROATE-VILANTEROL 200-25 MCG/INH IN AEPB
1.0000 | INHALATION_SPRAY | Freq: Every day | RESPIRATORY_TRACT | Status: AC
Start: 2014-12-18 — End: 2014-12-19

## 2014-12-18 NOTE — Patient Instructions (Addendum)
Follow up with Dr. Stevenson Clinch in: Jan 2017 -Complete current dose of Advair 250/50/50, then start BreoEllipta 200/25 one puff daily, gargle and rinse after each use -Continue with Spiriva -Continue with supplemental oxygen, 2 L at night -We will set she will up for a pulmonary function test and a 6 minute walk test prior to next visit -Avoid any type of noxious substances such as smoke, vapors, electronic cigarettes -Referral to pulmonary rehabilitation

## 2014-12-18 NOTE — Assessment & Plan Note (Signed)
Stable pulmonary nodules over the past one year. Former smoker. Former history of left upper lobe lung cancer with left upper lobe wedge resection and adjuvant chemotherapy in 2006.  Plan: -May repeat CT in 1 year

## 2014-12-18 NOTE — Assessment & Plan Note (Signed)
Patient with long-standing history of COPD/emphysema.  With smoking in 2003, used to smoke 1.5 packs per day for 40 years. Very severe obstruction by PFTs, last PFT January 2014, FEV1 27% Currently on Advair and Spiriva. Only using Advair once a day. This time will place patient on a longer acting duo bronchodilator such as BreoEllipta, and stop Advair.  Plan: - Breo 200/25 one puff daily, gargle and rinse after each use -Spiriva 1 capsule daily, gargle and rinse after each use -Pulmonary rehabilitation -Albuterol as needed for cough/shortness of breath/wheezing

## 2014-12-18 NOTE — Assessment & Plan Note (Signed)
Multifactorial: Age related, deconditioning, COPD, obstructive lung disease. Plan: -Continue with inhalers as prescribed -Continue with pulmonary rehabilitation -Pulmonary function testing and 6 minute walk test prior to follow-up visit

## 2014-12-18 NOTE — Progress Notes (Signed)
[facility] Consultation    Date: 12/18/2014  MRN# [id number] [name] 1933-09-19  Referring Physician: Dr. Felipa Furnace PMD - Dr. Mable Fill [name] is a 81 y.o. old female seen in consultation for transition of care, COPD, SOB.   CC:  Chief Complaint  Patient presents with  . pulmonary consult    self referral. hx of lung ca dx in 2006 and emphysema. pt. states SOB. occ. prod. cough yellow in color. occ. wheezing. denies chest pain or tightness. on 2L 02 at bedtime.    HPI:  Patient is a pleasant 81 year old female with a past medical history of COPD, lung cancer status post resection of the left upper lobe and adjuvant chemotherapy in 2006, hyperlipidemia, acid reflux disease, ovarian cyst, seen in consultation for transition of care from Dr. Gust Brooms office for COPD. Patient states she carries a diagnosis of COPD for a number of years, she weighs 2 L of oxygen at night, she is on Advair 250/50/50 one puff daily, Spiriva daily, and as needed albuterol. Patient states over the last 1-2 years her breathing is declined, major symptoms are shortness of breath especially with exertion, mild nonproductive cough, intermittent episodes of wheezing. Patient also endorses decreased energy level over the past year. She has severely obstructive disease by pulmonary function testing done [facility]. She has a past surgical history of cholecystectomy 2014, left upper lobe resection for lung cancer in 2006. Over the past year she's had no hospitalization for COPD or COPD exacerbation. In June of this year she had a course of steroids and antibiotics (Levaquin) for a mild COPD exacerbation/bronchitis. She also stated throughout most of 2015 she had many dental procedures done, and after that her energy level started to drop, she felt very deconditioned. In the past she is attempted pulmonary debilitation, but stopped due to low energy in "just not feeling like it"  which her daughter endorses. Patient is also hard of hearing especially in the left ear.      PMHX:   Past Medical History  Diagnosis Date  . Hyperlipidemia   . Lung disease   . GERD (gastroesophageal reflux disease)   . Shortness of breath   . Ovarian cyst 07/30/2014  . Cancer ([place])     lung, chemo   Surgical Hx:  Past Surgical History  Procedure Laterality Date  . Cholecystectomy  2014  . Lung surgery  2007    upper left lobe  . Tonsillectomy    . Breast biopsy Left 11-2012    neg., papilloma   Family Hx:  Family History  Problem Relation Age of Onset  . Cancer Daughter     breast  . Breast cancer Daughter 52   Social Hx:   Social History  Substance Use Topics  . Smoking status: Former Smoker -- 1.50 packs/day for 40 years    Types: Cigarettes    Quit date: 10/01/2001  . Smokeless tobacco: Former Systems developer  . Alcohol Use: Yes     Comment: socially   Medication:   Current Outpatient Rx  Name  Route  Sig  Dispense  Refill  . ADVAIR DISKUS 250-50 MCG/DOSE AEPB   Inhalation   Inhale 1 puff into the lungs daily.          Marland Kitchen aspirin 81 MG tablet   Oral   Take 81 mg by mouth daily.         . Calcium Carb-Cholecalciferol (CALCIUM 600 + D PO)   Oral  Take 1 tablet by mouth daily.         . cholecalciferol (VITAMIN D) 1000 UNITS tablet   Oral   Take 1,000 Units by mouth daily.         Marland Kitchen levalbuterol (XOPENEX HFA) 45 MCG/ACT inhaler   Inhalation   Inhale into the lungs every 4 (four) hours as needed for wheezing.         Marland Kitchen LORazepam (ATIVAN) 1 MG tablet   Oral   Take 0.5 mg by mouth at bedtime.          . megestrol (MEGACE) 40 MG tablet   Oral   Take 40 mg by mouth daily.         . mirtazapine (REMERON) 30 MG tablet   Oral   Take 1 tablet by mouth daily.         . Multiple Vitamin (MULTIVITAMIN) tablet   Oral   Take 1 tablet by mouth daily.         Marland Kitchen omeprazole (PRILOSEC) 40 MG capsule   Oral   Take 1 capsule by mouth daily.           . OXYGEN   Inhalation   Inhale into the lungs at bedtime.         . rosuvastatin (CRESTOR) 20 MG tablet   Oral   Take 20 mg by mouth daily.         Marland Kitchen SPIRIVA HANDIHALER 18 MCG inhalation capsule   Inhalation   Place 18 mcg into inhaler and inhale daily.          . Fluticasone Furoate-Vilanterol (BREO ELLIPTA) 200-25 MCG/INH AEPB   Inhalation   Inhale 1 puff into the lungs daily.   60 each   5   . Fluticasone Furoate-Vilanterol (BREO ELLIPTA) 200-25 MCG/INH AEPB   Inhalation   Inhale 1 puff into the lungs daily.   14 each   0       Allergies:  Morphine and related; Sulfonamide derivatives; and Tetanus toxoid  Review of Systems  Constitutional: Positive for weight loss. Negative for fever and chills.  HENT: Positive for hearing loss. Negative for congestion, ear discharge, nosebleeds and sore throat.   Eyes: Negative for blurred vision and double vision.  Respiratory: Positive for cough, shortness of breath and wheezing. Negative for hemoptysis, sputum production and stridor.   Cardiovascular: Negative for chest pain and palpitations.  Gastrointestinal: Positive for heartburn. Negative for nausea, vomiting and abdominal pain.  Genitourinary: Negative for dysuria.  Musculoskeletal: Negative for myalgias.  Skin: Negative for itching and rash.  Neurological: Negative for headaches.  Endo/Heme/Allergies: Does not bruise/bleed easily.     Physical Examination:   VS: BP 118/72 mmHg  Pulse 86  Ht '5\' 2"'$  (1.575 m)  Wt 108 lb 3.2 oz (49.079 kg)  BMI 19.78 kg/m2  SpO2 97%  General Appearance: No distress  Neuro:without focal findings, mental status, speech normal, alert and oriented, cranial nerves 2-12 intact, reflexes normal and symmetric, sensation grossly normal  HEENT: PERRLA, EOM intact, no ptosis, no other lesions noticed;  Pulmonary: Good airway entry, no significant adventitious breath sounds, mild decreased breath of the bases. Recumbent position,  mild diffuse wheezing throughout, no other adventitious breath sounds. CardiovascularNormal S1,S2.  No m/r/g.  Abdominal aorta pulsation normal.    Abdomen: Benign, Soft, non-tender, No masses, hepatosplenomegaly, No lymphadenopathy Renal:  No costovertebral tenderness  GU:  No performed at this time. Endoc: No evident thyromegaly, no signs of acromegaly or Cushing  features Skin:   warm, no rashes, no ecchymosis  Extremities: normal, no cyanosis, clubbing, no edema, warm with normal capillary refill. Other findings:    Rad results: (The following images and results were reviewed by Dr. Stevenson Clinch on 12/18/2014). CT Chest 12/03/2014 FINDINGS: Mediastinum: The heart size is normal. There is no pericardial effusion identified. Aortic atherosclerosis. The trachea appears patent. Unremarkable appearance of the esophagus. No mediastinal or hilar adenopathy. No axillary or supraclavicular adenopathy.  Lungs/Pleura: No pleural fluid identified. Postop changes from wedge resection surgery involving the left upper lobe are noted. There are no specific findings to suggest local tumor recurrence within the left upper lung zone. Moderate changes of centrilobular emphysema identified. Stable 3 mm left lower lobe lung nodule, image 29/series 4. Also stable is a 4 mm left lower lobe lung nodule, image 33 of series 4. Solid right lower lobe lung nodule measures 4 mm, image 31 of series 4. Unchanged from previous exam. There is no new or enlarging suspicious nodule or mass. Peribronchovascular nodules are identified within the posterior right lower lobe. There is associated mild a bronchiectasis and bronchial wall thickening, image 44 of series 4. Likely infectious or inflammatory.  Upper Abdomen: Normal appearance of the adrenal glands. The visualized portions of the kidneys and spleen are also normal. Calcified granuloma identified within the central liver.  Musculoskeletal: No aggressive lytic or  sclerotic bone lesion identified.  IMPRESSION: 1. Stable CT of the chest compared with 12/04/2013. 2. No significant change in solid-appearing nodules identified in both lungs which remain nonspecific. 3. Postinflammatory/infectious changes noted within the posterior right lower lobe. Similar to previous exam.    Pulmonary function testing 03/09/2012 FVC 56% FEV1 27% (0.5 L) and see his FEV1/FVC 36% TLC 146% VC 56% ERV 6% RV 252% DLCO is 33% Impression: Very severe obstruction by PFT  Pulmonary function testing 12/29/2010 FVC 81 FEV1 32% (0.62 L close ( FEV1/FVC 31% Vital capacity 81% TLC 129% DLCO 51% Impression: Very severe obstruction by PFT   Assessment and Plan: 81 year old female with past medical history of lung cancer status post left upper lobe resection and adjuvant chemotherapy, COPD/emphysema, and pulmonary nodules seen in consultation for transition of care for COPD Pulmonary nodules/lesions, multiple Stable pulmonary nodules over the past one year. Former smoker. Former history of left upper lobe lung cancer with left upper lobe wedge resection and adjuvant chemotherapy in 2006.  Plan: -May repeat CT in 1 year  DYSPNEA Multifactorial: Age related, deconditioning, COPD, obstructive lung disease. Plan: -Continue with inhalers as prescribed -Continue with pulmonary rehabilitation -Pulmonary function testing and 6 minute walk test prior to follow-up visit  EMPHYSEMA Patient with long-standing history of COPD/emphysema.  With smoking in 2003, used to smoke 1.5 packs per day for 40 years. Very severe obstruction by PFTs, last PFT January 2014, FEV1 27% Currently on Advair and Spiriva. Only using Advair once a day. This time will place patient on a longer acting duo bronchodilator such as BreoEllipta, and stop Advair.  Plan: - Breo 200/25 one puff daily, gargle and rinse after each use -Spiriva 1 capsule daily, gargle and rinse after each use -Pulmonary  rehabilitation -Albuterol as needed for cough/shortness of breath/wheezing     Updated Medication List Outpatient Encounter Prescriptions as of 12/18/2014  Medication Sig  . ADVAIR DISKUS 250-50 MCG/DOSE AEPB Inhale 1 puff into the lungs daily.   Marland Kitchen aspirin 81 MG tablet Take 81 mg by mouth daily.  . Calcium Carb-Cholecalciferol (CALCIUM 600 + D PO) Take  1 tablet by mouth daily.  . cholecalciferol (VITAMIN D) 1000 UNITS tablet Take 1,000 Units by mouth daily.  Marland Kitchen levalbuterol (XOPENEX HFA) 45 MCG/ACT inhaler Inhale into the lungs every 4 (four) hours as needed for wheezing.  Marland Kitchen LORazepam (ATIVAN) 1 MG tablet Take 0.5 mg by mouth at bedtime.   . megestrol (MEGACE) 40 MG tablet Take 40 mg by mouth daily.  . mirtazapine (REMERON) 30 MG tablet Take 1 tablet by mouth daily.  . Multiple Vitamin (MULTIVITAMIN) tablet Take 1 tablet by mouth daily.  Marland Kitchen omeprazole (PRILOSEC) 40 MG capsule Take 1 capsule by mouth daily.  . OXYGEN Inhale into the lungs at bedtime.  . rosuvastatin (CRESTOR) 20 MG tablet Take 20 mg by mouth daily.  Marland Kitchen SPIRIVA HANDIHALER 18 MCG inhalation capsule Place 18 mcg into inhaler and inhale daily.   . Fluticasone Furoate-Vilanterol (BREO ELLIPTA) 200-25 MCG/INH AEPB Inhale 1 puff into the lungs daily.  . Fluticasone Furoate-Vilanterol (BREO ELLIPTA) 200-25 MCG/INH AEPB Inhale 1 puff into the lungs daily.   No facility-administered encounter medications on file as of 12/18/2014.    Orders for this visit: Orders Placed This Encounter  Procedures  . Pulmonary rehab therapeutic exercise    Standing Status: Future     Number of Occurrences:      Standing Expiration Date: 12/18/2015  . Pulmonary function test    Standing Status: Future     Number of Occurrences:      Standing Expiration Date: 12/18/2015    Order Specific Question:  Where should this test be performed?    Answer:  [facility]    Order Specific Question:  Full PFT: includes the following: basic spirometry,  spirometry pre & post bronchodilator, diffusion capacity (DLCO), lung volumes    Answer:  Full PFT    Order Specific Question:  MIP/MEP    Answer:  No    Order Specific Question:  6 minute walk    Answer:  Yes    Order Specific Question:  ABG    Answer:  No    Order Specific Question:  Diffusion capacity (DLCO)    Answer:  Yes    Order Specific Question:  Lung volumes    Answer:  Yes    Order Specific Question:  Methacholine challenge    Answer:  No  . 6 minute walk    Standing Status: Future     Number of Occurrences:      Standing Expiration Date: 12/18/2015    Order Specific Question:  Where should this test be performed?    Answer:  Other     Thank  you for the consultation and for allowing [facility] to assist in the care of your patient. Our recommendations are noted above.  Please contact us if we can be of further service.   Vilinda Boehringer, MD [facility] Office Number: [phone]  Note: This note was prepared with Dragon dictation along with smaller phrase technology. Any transcriptional errors that result from this process are unintentional.

## 2014-12-24 ENCOUNTER — Telehealth: Payer: Self-pay | Admitting: *Deleted

## 2014-12-24 NOTE — Telephone Encounter (Signed)
Pt informed she can use her inhaler in the am but none after 10:30am duet o her walk and PFT in the afternoon. Nothing further needed.

## 2014-12-24 NOTE — Telephone Encounter (Signed)
Please call patient. She has an appointment tomorrow afternoon and is worried about needing oxygen.

## 2014-12-25 ENCOUNTER — Ambulatory Visit (INDEPENDENT_AMBULATORY_CARE_PROVIDER_SITE_OTHER): Payer: PPO | Admitting: Internal Medicine

## 2014-12-25 ENCOUNTER — Other Ambulatory Visit: Payer: Self-pay | Admitting: *Deleted

## 2014-12-25 DIAGNOSIS — J438 Other emphysema: Secondary | ICD-10-CM

## 2014-12-25 DIAGNOSIS — R0602 Shortness of breath: Secondary | ICD-10-CM

## 2014-12-25 LAB — PULMONARY FUNCTION TEST
DL/VA % PRED: 51 %
DL/VA: 2.33 ml/min/mmHg/L
DLCO unc % pred: 29 %
DLCO unc: 6.41 ml/min/mmHg
FEF 25-75 POST: 0.31 L/s
FEF 25-75 Pre: 0.26 L/sec
FEF2575-%Change-Post: 19 %
FEF2575-%PRED-POST: 22 %
FEF2575-%PRED-PRE: 19 %
FEV1-%Change-Post: 4 %
FEV1-%PRED-PRE: 32 %
FEV1-%Pred-Post: 34 %
FEV1-PRE: 0.59 L
FEV1-Post: 0.61 L
FEV1FVC-%CHANGE-POST: -2 %
FEV1FVC-%PRED-PRE: 53 %
FEV6-%Change-Post: 6 %
FEV6-%Pred-Post: 67 %
FEV6-%Pred-Pre: 63 %
FEV6-POST: 1.53 L
FEV6-Pre: 1.44 L
FEV6FVC-%Change-Post: 0 %
FEV6FVC-%Pred-Post: 102 %
FEV6FVC-%Pred-Pre: 102 %
FVC-%Change-Post: 6 %
FVC-%PRED-PRE: 61 %
FVC-%Pred-Post: 65 %
FVC-POST: 1.58 L
FVC-PRE: 1.48 L
POST FEV1/FVC RATIO: 39 %
PRE FEV1/FVC RATIO: 40 %
Post FEV6/FVC ratio: 97 %
Pre FEV6/FVC Ratio: 97 %

## 2014-12-25 MED ORDER — TIOTROPIUM BROMIDE MONOHYDRATE 18 MCG IN CAPS: 18.0000 ug | ORAL_CAPSULE | Freq: Every day | RESPIRATORY_TRACT | Status: DC

## 2014-12-25 NOTE — Progress Notes (Signed)
SMW performed today. 

## 2014-12-25 NOTE — Progress Notes (Signed)
PFT performed today with nitrogen washout. 

## 2014-12-26 ENCOUNTER — Other Ambulatory Visit: Payer: Self-pay | Admitting: Internal Medicine

## 2014-12-26 DIAGNOSIS — J449 Chronic obstructive pulmonary disease, unspecified: Secondary | ICD-10-CM

## 2015-01-13 ENCOUNTER — Telehealth: Payer: Self-pay | Admitting: *Deleted

## 2015-01-13 NOTE — Telephone Encounter (Signed)
Mungal pt:  Pt states she had bumps in her mouth and her mouth is raw. Started Breo Nov. 16th and on 20th got a fever, been running low grade since then. Called PCP and they gave Prednisone and ZPak. Pt still have fever and still some blisters in mouth. Informed pt to stop Breo until further notice. Please advise.

## 2015-01-13 NOTE — Telephone Encounter (Signed)
Pt calling stating she may be having a reaction to the BreoEllipta that she started.  States she had a fever and mouth felt raw for a while now.  Thought it was a cold called PCP and took her antibiotic but not sure why she is still feeling like this.  Please advise

## 2015-01-13 NOTE — Telephone Encounter (Signed)
Hold Breo until seen again by Franklyn Lor, MD PCCM service Mobile [phone] Pager [phone]

## 2015-01-14 NOTE — Telephone Encounter (Signed)
Pt states she got more prednisone and Doxycycline from her PCP yesterday. States this am she is feeling better. Pt states she is still taking her Spiriva. She will continue Spiriva until her next appt with VM. Per VM she is to d/c Breo. Nothing further needed.

## 2015-03-06 ENCOUNTER — Ambulatory Visit (INDEPENDENT_AMBULATORY_CARE_PROVIDER_SITE_OTHER): Payer: PPO | Admitting: Internal Medicine

## 2015-03-06 ENCOUNTER — Encounter: Payer: Self-pay | Admitting: Internal Medicine

## 2015-03-06 VITALS — BP 126/66 | HR 102 | Ht 62.0 in | Wt 104.4 lb

## 2015-03-06 DIAGNOSIS — J449 Chronic obstructive pulmonary disease, unspecified: Secondary | ICD-10-CM | POA: Diagnosis not present

## 2015-03-06 DIAGNOSIS — J438 Other emphysema: Secondary | ICD-10-CM

## 2015-03-06 DIAGNOSIS — R0602 Shortness of breath: Secondary | ICD-10-CM

## 2015-03-06 NOTE — Assessment & Plan Note (Signed)
Patient with long-standing history of COPD/emphysema.  With smoking in 2003, used to smoke 1.5 packs per day for 40 years. Very severe obstruction by PFTs, last PFT January 2014, FEV1 27% Currently on Advair and Spiriva. Only using Advair once a day. Previously on a trial of Breo, but stopped due to tongue and throat swelling.   Plan: - cont with Advair, gargle and rinse after each use -Spiriva 1 capsule daily, gargle and rinse after each use -Pulmonary rehabilitation -Albuterol as needed for cough/shortness of breath/wheezing -CXR prior to follow up visit due to recent URI.

## 2015-03-06 NOTE — Assessment & Plan Note (Signed)
Multifactorial: Age related, deconditioning, COPD, obstructive lung disease. Plan: -Continue with inhalers as prescribed -Continue with pulmonary rehabilitation -Pulmonary function testing and 6 minute walk test prior to follow-up visit

## 2015-03-06 NOTE — Progress Notes (Signed)
[facility] Consultation    Date: 03/06/2015  MRN# [id number] [name] November 27, 1933  Referring Physician: Dr. Felipa Furnace PMD - Dr. Mable Fill [name] is a 82 y.o. old female seen in consultation for transition of care, COPD, SOB.   CC:  Chief Complaint  Patient presents with  . Follow-up    pt. states breathing has improved. occ. SOB. denies cough, wheezing. some soreness on lt. side with breathing.    HPI 12/25/2014:  Patient is a pleasant 82 year old female with a past medical history of COPD, lung cancer status post resection of the left upper lobe and adjuvant chemotherapy in 2006, hyperlipidemia, acid reflux disease, ovarian cyst, seen in consultation for transition of care from Dr. Gust Brooms office for COPD. Patient states she carries a diagnosis of COPD for a number of years, she weighs 2 L of oxygen at night, she is on Advair 250/50/50 one puff daily, Spiriva daily, and as needed albuterol. Patient states over the last 1-2 years her breathing is declined, major symptoms are shortness of breath especially with exertion, mild nonproductive cough, intermittent episodes of wheezing. Patient also endorses decreased energy level over the past year. She has severely obstructive disease by pulmonary function testing done at [facility]. She has a past surgical history of cholecystectomy 2014, left upper lobe resection for lung cancer in 2006. Over the past year she's had no hospitalization for COPD or COPD exacerbation. In June of this year she had a course of steroids and antibiotics (Levaquin) for a mild COPD exacerbation/bronchitis. She also stated throughout most of 2015 she had many dental procedures done, and after that her energy level started to drop, she felt very deconditioned. In the past she is attempted pulmonary debilitation, but stopped due to low energy in "just not feeling like it" which her daughter endorses. Patient is also hard of  hearing especially in the left ear. PLAN Plan: - Breo 200/25 one puff daily, gargle and rinse after each use -Spiriva 1 capsule daily, gargle and rinse after each use -Pulmonary rehabilitation -Albuterol as needed for cough/shortness of breath/wheezing - consider repeat chest CT in 1 year for pulmonary nodules.    Events since last clinic visit:  patient presents today for follow-up visit of her COPD. Review of chart shows that she recently saw her primary care physician in December of last year, was started on antibiotics and steroids for an upper  respiratorytract infection with symptoms of nasal congestion, productive cough.  overall today patient states she is doing better, still has a dry cough with left-sided muscular skeletal rib discomfort.  at her last visit she was started on Breo,  But started having throat and tongue swelling, it was stopped and she was started on Advair.  review of records also showed that patient was fairly ill during the month of December, requiring prednisone and 3 rounds of antibiotics. Today she is still little fatigued from her recent upper story tract infection,  PMHX:   Past Medical History  Diagnosis Date  . Hyperlipidemia   . Lung disease   . GERD (gastroesophageal reflux disease)   . Shortness of breath   . Ovarian cyst 07/30/2014  . Cancer ([place])     lung, chemo   Surgical Hx:  Past Surgical History  Procedure Laterality Date  . Cholecystectomy  2014  . Lung surgery  2007    upper left lobe  . Tonsillectomy    . Breast biopsy Left 11-2012    neg.,  papilloma   Family Hx:  Family History  Problem Relation Age of Onset  . Cancer Daughter     breast  . Breast cancer Daughter 34   Social Hx:   Social History  Substance Use Topics  . Smoking status: Former Smoker -- 1.50 packs/day for 40 years    Types: Cigarettes    Quit date: 10/01/2001  . Smokeless tobacco: Former Systems developer  . Alcohol Use: Yes     Comment: socially   Medication:     Current Outpatient Rx  Name  Route  Sig  Dispense  Refill  . ADVAIR DISKUS 250-50 MCG/DOSE AEPB   Inhalation   Inhale 1 puff into the lungs daily.          Marland Kitchen aspirin 81 MG tablet   Oral   Take 81 mg by mouth daily.         . Calcium Carb-Cholecalciferol (CALCIUM 600 + D PO)   Oral   Take 1 tablet by mouth daily.         . cholecalciferol (VITAMIN D) 1000 UNITS tablet   Oral   Take 1,000 Units by mouth daily.         Marland Kitchen levalbuterol (XOPENEX HFA) 45 MCG/ACT inhaler   Inhalation   Inhale into the lungs every 4 (four) hours as needed for wheezing.         Marland Kitchen LORazepam (ATIVAN) 1 MG tablet   Oral   Take 0.5 mg by mouth at bedtime.          . megestrol (MEGACE) 40 MG tablet   Oral   Take 40 mg by mouth daily.         . mirtazapine (REMERON) 30 MG tablet   Oral   Take 1 tablet by mouth daily.         . Multiple Vitamin (MULTIVITAMIN) tablet   Oral   Take 1 tablet by mouth daily.         Marland Kitchen omeprazole (PRILOSEC) 40 MG capsule   Oral   Take 1 capsule by mouth daily.         . OXYGEN   Inhalation   Inhale into the lungs at bedtime.         . rosuvastatin (CRESTOR) 20 MG tablet   Oral   Take 20 mg by mouth daily.         Marland Kitchen SPIRIVA HANDIHALER 18 MCG inhalation capsule   Inhalation   Place 18 mcg into inhaler and inhale daily.              Allergies:  Morphine and related; Sulfonamide derivatives; and Tetanus toxoid  Review of Systems  Constitutional: Negative for fever, chills and weight loss.  HENT: Positive for hearing loss. Negative for congestion, ear discharge, nosebleeds and sore throat.   Eyes: Negative for blurred vision and double vision.  Respiratory: Positive for cough and shortness of breath. Negative for hemoptysis, sputum production, wheezing and stridor.   Cardiovascular: Negative for chest pain and palpitations.  Gastrointestinal: Negative for nausea, vomiting and abdominal pain.  Genitourinary: Negative for dysuria.   Musculoskeletal: Negative for myalgias.  Skin: Negative for itching and rash.  Neurological: Negative for headaches.  Endo/Heme/Allergies: Does not bruise/bleed easily.     Physical Examination:   VS: BP 126/66 mmHg  Pulse 102  Ht '5\' 2"'$  (1.575 m)  Wt 104 lb 6.4 oz (47.356 kg)  BMI 19.09 kg/m2  SpO2 92%  General Appearance: No distress  Neuro:without focal findings,  mental status, speech normal, alert and oriented, cranial nerves 2-12 intact, reflexes normal and symmetric, sensation grossly normal  HEENT: PERRLA, EOM intact, no ptosis, no other lesions noticed;  Pulmonary: Good airway entry, no significant adventitious breath sounds, mild decreased breath of the bases. Recumbent position, mild diffuse wheezing throughout, no other adventitious breath sounds. CardiovascularNormal S1,S2.  No m/r/g.  Abdominal aorta pulsation normal.    Abdomen: Benign, Soft, non-tender, No masses, hepatosplenomegaly, No lymphadenopathy Renal:  No costovertebral tenderness  GU:  No performed at this time. Endoc: No evident thyromegaly, no signs of acromegaly or Cushing features Skin:   warm, no rashes, no ecchymosis  Extremities: normal, no cyanosis, clubbing, no edema, warm with normal capillary refill. Other findings:    Rad results: (The following images and results were reviewed by Dr. Stevenson Clinch on 03/06/2015). CT Chest 12/03/2014 FINDINGS: Mediastinum: The heart size is normal. There is no pericardial effusion identified. Aortic atherosclerosis. The trachea appears patent. Unremarkable appearance of the esophagus. No mediastinal or hilar adenopathy. No axillary or supraclavicular adenopathy.  Lungs/Pleura: No pleural fluid identified. Postop changes from wedge resection surgery involving the left upper lobe are noted. There are no specific findings to suggest local tumor recurrence within the left upper lung zone. Moderate changes of centrilobular emphysema identified. Stable 3 mm left lower  lobe lung nodule, image 29/series 4. Also stable is a 4 mm left lower lobe lung nodule, image 33 of series 4. Solid right lower lobe lung nodule measures 4 mm, image 31 of series 4. Unchanged from previous exam. There is no new or enlarging suspicious nodule or mass. Peribronchovascular nodules are identified within the posterior right lower lobe. There is associated mild a bronchiectasis and bronchial wall thickening, image 44 of series 4. Likely infectious or inflammatory.  Upper Abdomen: Normal appearance of the adrenal glands. The visualized portions of the kidneys and spleen are also normal. Calcified granuloma identified within the central liver.  Musculoskeletal: No aggressive lytic or sclerotic bone lesion identified.  IMPRESSION: 1. Stable CT of the chest compared with 12/04/2013. 2. No significant change in solid-appearing nodules identified in both lungs which remain nonspecific. 3. Postinflammatory/infectious changes noted within the posterior right lower lobe. Similar to previous exam.    Pulmonary function testing 03/09/2012 FVC 56% FEV1 27% (0.5 L) and see his FEV1/FVC 36% TLC 146% VC 56% ERV 6% RV 252% DLCO is 33% Impression: Very severe obstruction by PFT  Pulmonary function testing 12/29/2010 FVC 81 FEV1 32% (0.62 L close ( FEV1/FVC 31% Vital capacity 81% TLC 129% DLCO 51% Impression: Very severe obstruction by PFT   Assessment and Plan: 82 year old female with past medical history of lung cancer status post left upper lobe resection and adjuvant chemotherapy, COPD/emphysema, and pulmonary nodules seen in consultation for transition of care for COPD DYSPNEA Multifactorial: Age related, deconditioning, COPD, obstructive lung disease. Plan: -Continue with inhalers as prescribed -Continue with pulmonary rehabilitation -Pulmonary function testing and 6 minute walk test prior to follow-up visit    EMPHYSEMA Patient with long-standing history of  COPD/emphysema.  With smoking in 2003, used to smoke 1.5 packs per day for 40 years. Very severe obstruction by PFTs, last PFT January 2014, FEV1 27% Currently on Advair and Spiriva. Only using Advair once a day. Previously on a trial of Breo, but stopped due to tongue and throat swelling.   Plan: - cont with Advair, gargle and rinse after each use -Spiriva 1 capsule daily, gargle and rinse after each use -Pulmonary rehabilitation -Albuterol as  needed for cough/shortness of breath/wheezing -CXR prior to follow up visit due to recent URI.        Updated Medication List Outpatient Encounter Prescriptions as of 03/06/2015  Medication Sig  . ADVAIR DISKUS 250-50 MCG/DOSE AEPB Inhale 1 puff into the lungs daily.   Marland Kitchen aspirin 81 MG tablet Take 81 mg by mouth daily.  . Calcium Carb-Cholecalciferol (CALCIUM 600 + D PO) Take 1 tablet by mouth daily.  . cholecalciferol (VITAMIN D) 1000 UNITS tablet Take 1,000 Units by mouth daily.  Marland Kitchen levalbuterol (XOPENEX HFA) 45 MCG/ACT inhaler Inhale into the lungs every 4 (four) hours as needed for wheezing.  Marland Kitchen LORazepam (ATIVAN) 1 MG tablet Take 0.5 mg by mouth at bedtime.   . megestrol (MEGACE) 40 MG tablet Take 40 mg by mouth daily.  . mirtazapine (REMERON) 30 MG tablet Take 1 tablet by mouth daily.  . Multiple Vitamin (MULTIVITAMIN) tablet Take 1 tablet by mouth daily.  Marland Kitchen omeprazole (PRILOSEC) 40 MG capsule Take 1 capsule by mouth daily.  . OXYGEN Inhale into the lungs at bedtime.  . rosuvastatin (CRESTOR) 20 MG tablet Take 20 mg by mouth daily.  Marland Kitchen SPIRIVA HANDIHALER 18 MCG inhalation capsule Place 18 mcg into inhaler and inhale daily.   . [DISCONTINUED] Fluticasone Furoate-Vilanterol (BREO ELLIPTA) 200-25 MCG/INH AEPB Inhale 1 puff into the lungs daily. (Patient not taking: Reported on 03/06/2015)  . [DISCONTINUED] tiotropium (SPIRIVA) 18 MCG inhalation capsule Place 1 capsule (18 mcg total) into inhaler and inhale daily.   No facility-administered  encounter medications on file as of 03/06/2015.    Orders for this visit: No orders of the defined types were placed in this encounter.     Thank  you for the consultation and for allowing [facility] to assist in the care of your patient. Our recommendations are noted above.  Please contact us if we can be of further service.   Vilinda Boehringer, MD [facility] Office Number: [phone]  Note: This note was prepared with Dragon dictation along with smaller phrase technology. Any transcriptional errors that result from this process are unintentional.

## 2015-03-06 NOTE — Patient Instructions (Signed)
Follow up with Dr. Stevenson Clinch in: 2 months -  CXR (2 view) prior to follow up visit - cont with current inhalers

## 2015-03-13 ENCOUNTER — Other Ambulatory Visit: Payer: PPO

## 2015-03-13 ENCOUNTER — Ambulatory Visit: Payer: PPO | Admitting: Oncology

## 2015-03-17 ENCOUNTER — Other Ambulatory Visit: Payer: Self-pay | Admitting: Internal Medicine

## 2015-03-17 DIAGNOSIS — E782 Mixed hyperlipidemia: Secondary | ICD-10-CM | POA: Diagnosis not present

## 2015-03-17 DIAGNOSIS — F329 Major depressive disorder, single episode, unspecified: Secondary | ICD-10-CM | POA: Diagnosis not present

## 2015-03-17 DIAGNOSIS — F419 Anxiety disorder, unspecified: Secondary | ICD-10-CM | POA: Diagnosis not present

## 2015-03-17 DIAGNOSIS — R928 Other abnormal and inconclusive findings on diagnostic imaging of breast: Secondary | ICD-10-CM

## 2015-03-17 DIAGNOSIS — J431 Panlobular emphysema: Secondary | ICD-10-CM | POA: Diagnosis not present

## 2015-03-24 ENCOUNTER — Other Ambulatory Visit: Payer: Self-pay | Admitting: Family Medicine

## 2015-04-01 ENCOUNTER — Ambulatory Visit
Admission: RE | Admit: 2015-04-01 | Discharge: 2015-04-01 | Disposition: A | Discharge status: Home / Self Care | Payer: PPO | Source: Ambulatory Visit | Attending: Internal Medicine | Admitting: Internal Medicine

## 2015-04-01 DIAGNOSIS — R928 Other abnormal and inconclusive findings on diagnostic imaging of breast: Secondary | ICD-10-CM | POA: Diagnosis not present

## 2015-04-01 DIAGNOSIS — N6489 Other specified disorders of breast: Secondary | ICD-10-CM | POA: Diagnosis not present

## 2015-04-06 DIAGNOSIS — J449 Chronic obstructive pulmonary disease, unspecified: Secondary | ICD-10-CM | POA: Diagnosis not present

## 2015-04-22 DIAGNOSIS — H6063 Unspecified chronic otitis externa, bilateral: Secondary | ICD-10-CM | POA: Diagnosis not present

## 2015-04-22 DIAGNOSIS — H6123 Impacted cerumen, bilateral: Secondary | ICD-10-CM | POA: Diagnosis not present

## 2015-05-04 DIAGNOSIS — J449 Chronic obstructive pulmonary disease, unspecified: Secondary | ICD-10-CM | POA: Diagnosis not present

## 2015-05-06 ENCOUNTER — Encounter: Payer: PPO | Attending: Internal Medicine | Admitting: Respiratory Therapy

## 2015-05-06 VITALS — Ht 62.5 in | Wt 108.0 lb

## 2015-05-06 DIAGNOSIS — J449 Chronic obstructive pulmonary disease, unspecified: Secondary | ICD-10-CM | POA: Insufficient documentation

## 2015-05-06 NOTE — Progress Notes (Signed)
Pulmonary Individual Treatment Plan  Patient Details  Name: [name] MRN: [id number] Date of Birth: 04-Dec-1933 Referring Provider:  Vilinda Boehringer, MD  Initial Encounter Date:       Pulmonary Rehab from 05/06/2015 in [facility] Cardiac and Pulmonary Rehab   Date  05/06/15      Visit Diagnosis: COPD, moderate ([place])  Patient's Home Medications on Admission:  Current outpatient prescriptions:  .  ADVAIR DISKUS 250-50 MCG/DOSE AEPB, INHALE 1 PUFF TWICE DAILY.  RINSE MOUTH AFTER EACH USE, Disp: 60 each, Rfl: 3 .  aspirin 81 MG tablet, Take 81 mg by mouth daily., Disp: , Rfl:  .  Calcium Carb-Cholecalciferol (CALCIUM 600 + D PO), Take 1 tablet by mouth daily., Disp: , Rfl:  .  cholecalciferol (VITAMIN D) 1000 UNITS tablet, Take 1,000 Units by mouth daily., Disp: , Rfl:  .  levalbuterol (XOPENEX HFA) 45 MCG/ACT inhaler, Inhale into the lungs every 4 (four) hours as needed for wheezing., Disp: , Rfl:  .  LORazepam (ATIVAN) 1 MG tablet, Take 0.5 mg by mouth at bedtime. , Disp: , Rfl:  .  megestrol (MEGACE) 40 MG tablet, Take 40 mg by mouth daily., Disp: , Rfl:  .  mirtazapine (REMERON) 30 MG tablet, Take 1 tablet by mouth daily., Disp: , Rfl:  .  Multiple Vitamin (MULTIVITAMIN) tablet, Take 1 tablet by mouth daily., Disp: , Rfl:  .  omeprazole (PRILOSEC) 40 MG capsule, Take 1 capsule by mouth daily., Disp: , Rfl:  .  OXYGEN, Inhale into the lungs at bedtime., Disp: , Rfl:  .  rosuvastatin (CRESTOR) 20 MG tablet, Take 20 mg by mouth daily., Disp: , Rfl:  .  SPIRIVA HANDIHALER 18 MCG inhalation capsule, Place 18 mcg into inhaler and inhale daily. , Disp: , Rfl:   Past Medical History: Past Medical History  Diagnosis Date  . Hyperlipidemia   . Lung disease   . GERD (gastroesophageal reflux disease)   . Shortness of breath   . Ovarian cyst 07/30/2014  . Cancer ([place])     lung, chemo    Tobacco Use: History  Smoking status  . Former Smoker -- 1.50 packs/day for 40 years  . Types:  Cigarettes  . Quit date: 10/01/2001  Smokeless tobacco  . Former Geophysical data processor: Recent Review Flowsheet Data    There is no flowsheet data to display.       ADL UCSD:     ADL UCSD      05/06/15 1330       ADL UCSD   ADL Phase Entry     SOB Score total 44     Rest 0     Walk 0     Stairs 4     Bath 1     Dress 1     Shop 2        Pulmonary Function Assessment:     Pulmonary Function Assessment - 05/06/15 1330    Pulmonary Function Tests   RV% 96 %   DLCO% 29 %   Initial Spirometry Results   FVC% 61 %   FEV1% 32 %   FEV1/FVC Ratio 40   Post Bronchodilator Spirometry Results   FVC% 65 %   FEV1% 34 %   FEV1/FVC Ratio 39   Comments Test date 12/25/2014   Breath   Bilateral Breath Sounds Decreased;Clear  Lt upper lobectomy   Shortness of Breath Yes;Limiting activity      Exercise Target Goals: Date: 05/06/15  Exercise Program Goal: Individual exercise prescription set with THRR, safety & activity barriers. Participant demonstrates ability to understand and report RPE using BORG scale, to self-measure pulse accurately, and to acknowledge the importance of the exercise prescription.  Exercise Prescription Goal: Starting with aerobic activity 30 plus minutes a day, 3 days per week for initial exercise prescription. Provide home exercise prescription and guidelines that participant acknowledges understanding prior to discharge.  Activity Barriers & Risk Stratification:     Activity Barriers & Cardiac Risk Stratification - 05/06/15 1330    Activity Barriers & Cardiac Risk Stratification   Activity Barriers Shortness of Breath;Deconditioning;Muscular Weakness      6 Minute Walk:     6 Minute Walk      05/06/15 1645       6 Minute Walk   Phase Initial     Distance 1125 feet     Walk Time 6 minutes     RPE 13     Perceived Dyspnea  4     Symptoms No     Resting HR 74 bpm     Resting BP 118/70 mmHg     Max Ex. HR 96 bpm     Max Ex. BP 128/76  mmHg        Initial Exercise Prescription:     Initial Exercise Prescription - 05/06/15 1600    Date of Initial Exercise Prescription   Date 05/06/15   Treadmill   MPH 1.5   Grade 0   Minutes 10   Recumbant Bike   Level 2   RPM 40   Watts 20   Minutes 10   NuStep   Level 2   Watts 40   Minutes 10   Arm Ergometer   Level 1   Watts 10   Minutes 10   Biostep-RELP   Level 2   Watts 40   Minutes 10   Prescription Details   Frequency (times per week) 3   Duration Progress to 30 minutes of continuous aerobic without signs/symptoms of physical distress   Intensity   THRR REST +  30   Ratings of Perceived Exertion 11-15   Perceived Dyspnea 0-4   Progression   Progression Continue progressive overload as per policy without signs/symptoms or physical distress.   Resistance Training   Training Prescription Yes   Weight 1   Reps 10-15      Perform Capillary Blood Glucose checks as needed.  Exercise Prescription Changes:   Exercise Comments:   Discharge Exercise Prescription (Final Exercise Prescription Changes):    Nutrition:  Target Goals: Understanding of nutrition guidelines, daily intake of sodium '1500mg'$ , cholesterol '200mg'$ , calories 30% from fat and 7% or less from saturated fats, daily to have 5 or more servings of fruits and vegetables.  Biometrics:     Pre Biometrics - 05/06/15 1649    Pre Biometrics   Height 5' 2.5" (1.588 m)   Weight 108 lb (48.988 kg)   Waist Circumference 31 inches   Hip Circumference 31.5 inches   Waist to Hip Ratio 0.98 %   BMI (Calculated) 19.5       Nutrition Therapy Plan and Nutrition Goals:     Nutrition Therapy & Goals - 05/06/15 1330    Nutrition Therapy   Diet Ms Ramsaran prefers not to meet with the dietitian. She cooks healthy and eats small portion sizes.      Nutrition Discharge: Rate Your Plate Scores:   Psychosocial: Target Goals: Acknowledge presence or absence of  depression, maximize coping  skills, provide positive support system. Participant is able to verbalize types and ability to use techniques and skills needed for reducing stress and depression.  Initial Review & Psychosocial Screening:     Initial Psych Review & Screening - 05/06/15 1330    Family Dynamics   Good Support System? Yes   Comments Ms Demby has support from her daughter. Ms Tashiro read an article on depression and told her doctor she had the article's symptoms, mainly not wanting to go out or be involved with anyone. He put her on lexapro which helped.   Barriers   Psychosocial barriers to participate in program The patient should benefit from training in stress management and relaxation.   Screening Interventions   Interventions Program counselor consult;Encouraged to exercise      Quality of Life Scores:     Quality of Life - 05/06/15 1330    Quality of Life Scores   Health/Function Pre 15.07 %   Socioeconomic Pre 26 %   Psych/Spiritual Pre 19.43 %   Family Pre 19.5 %   GLOBAL Pre 18.63 %      PHQ-9:     Recent Review Flowsheet Data    Depression screen [facility] 2/9 05/06/2015   Decreased Interest 1   Down, Depressed, Hopeless 0   PHQ - 2 Score 1   Altered sleeping 0   Tired, decreased energy 1   Change in appetite 1   Feeling bad or failure about yourself  0   Trouble concentrating 0   Moving slowly or fidgety/restless 0   Suicidal thoughts 0   PHQ-9 Score 3   Difficult doing work/chores Somewhat difficult      Psychosocial Evaluation and Intervention:   Psychosocial Re-Evaluation:  Education: Education Goals: Education classes will be provided on a weekly basis, covering required topics. Participant will state understanding/return demonstration of topics presented.  Learning Barriers/Preferences:     Learning Barriers/Preferences - 05/06/15 1330    Learning Barriers/Preferences   Learning Barriers None   Learning Preferences Group Instruction;Individual  Instruction;Pictoral;Skilled Demonstration;Verbal Instruction;Video;Written Material      Education Topics: Initial Evaluation Education: - Verbal, written and demonstration of respiratory meds, RPE/PD scales, oximetry and breathing techniques. Instruction on use of nebulizers and MDIs: cleaning and proper use, rinsing mouth with steroid doses and importance of monitoring MDI activations.          Pulmonary Rehab from 05/06/2015 in [facility] Cardiac and Pulmonary Rehab   Date  05/06/15   Educator  LB   Instruction Review Code  2- meets goals/outcomes      General Nutrition Guidelines/Fats and Fiber: -Group instruction provided by verbal, written material, models and posters to present the general guidelines for heart healthy nutrition. Gives an explanation and review of dietary fats and fiber.   Controlling Sodium/Reading Food Labels: -Group verbal and written material supporting the discussion of sodium use in heart healthy nutrition. Review and explanation with models, verbal and written materials for utilization of the food label.   Exercise Physiology & Risk Factors: - Group verbal and written instruction with models to review the exercise physiology of the cardiovascular system and associated critical values. Details cardiovascular disease risk factors and the goals associated with each risk factor.   Aerobic Exercise & Resistance Training: - Gives group verbal and written discussion on the health impact of inactivity. On the components of aerobic and resistive training programs and the benefits of this training and how to safely progress through these programs.  Flexibility, Balance, General Exercise Guidelines: - Provides group verbal and written instruction on the benefits of flexibility and balance training programs. Provides general exercise guidelines with specific guidelines to those with heart or lung disease. Demonstration and skill practice provided.   Stress  Management: - Provides group verbal and written instruction about the health risks of elevated stress, cause of high stress, and healthy ways to reduce stress.   Depression: - Provides group verbal and written instruction on the correlation between heart/lung disease and depressed mood, treatment options, and the stigmas associated with seeking treatment.   Exercise & Equipment Safety: - Individual verbal instruction and demonstration of equipment use and safety with use of the equipment.   Infection Prevention: - Provides verbal and written material to individual with discussion of infection control including proper hand washing and proper equipment cleaning during exercise session.   Falls Prevention: - Provides verbal and written material to individual with discussion of falls prevention and safety.      Pulmonary Rehab from 05/06/2015 in [facility] Cardiac and Pulmonary Rehab   Date  05/06/15   Educator  LB   Instruction Review Code  2- meets goals/outcomes      Diabetes: - Individual verbal and written instruction to review signs/symptoms of diabetes, desired ranges of glucose level fasting, after meals and with exercise. Advice that pre and post exercise glucose checks will be done for 3 sessions at entry of program.   Chronic Lung Diseases: - Group verbal and written instruction to review new updates, new respiratory medications, new advancements in procedures and treatments. Provide informative websites and "800" numbers of self-education.   Lung Procedures: - Group verbal and written instruction to describe testing methods done to diagnose lung disease. Review the outcome of test results. Describe the treatment choices: Pulmonary Function Tests, ABGs and oximetry.   Energy Conservation: - Provide group verbal and written instruction for methods to conserve energy, plan and organize activities. Instruct on pacing techniques, use of adaptive equipment and posture/positioning to  relieve shortness of breath.   Triggers: - Group verbal and written instruction to review types of environmental controls: home humidity, furnaces, filters, dust mite/pet prevention, HEPA vacuums. To discuss weather changes, air quality and the benefits of nasal washing.   Exacerbations: - Group verbal and written instruction to provide: warning signs, infection symptoms, calling MD promptly, preventive modes, and value of vaccinations. Review: effective airway clearance, coughing and/or vibration techniques. Create an Sports administrator.   Oxygen: - Individual and group verbal and written instruction on oxygen therapy. Includes supplement oxygen, available portable oxygen systems, continuous and intermittent flow rates, oxygen safety, concentrators, and Medicare reimbursement for oxygen.   Respiratory Medications: - Group verbal and written instruction to review medications for lung disease. Drug class, frequency, complications, importance of spacers, rinsing mouth after steroid MDI's, and proper cleaning methods for nebulizers.      Pulmonary Rehab from 05/06/2015 in [facility] Cardiac and Pulmonary Rehab   Date  05/06/15   Educator  LB   Instruction Review Code  2- meets goals/outcomes      AED/CPR: - Group verbal and written instruction with the use of models to demonstrate the basic use of the AED with the basic ABC's of resuscitation.   Breathing Retraining: - Provides individuals verbal and written instruction on purpose, frequency, and proper technique of diaphragmatic breathing and pursed-lipped breathing. Applies individual practice skills.      Pulmonary Rehab from 05/06/2015 in [facility] Cardiac and Pulmonary Rehab   Date  05/06/15   Educator  LB   Instruction Review Code  2- meets goals/outcomes      Anatomy and Physiology of the Lungs: - Group verbal and written instruction with the use of models to provide basic lung anatomy and physiology related to function, structure and  complications of lung disease.   Heart Failure: - Group verbal and written instruction on the basics of heart failure: signs/symptoms, treatments, explanation of ejection fraction, enlarged heart and cardiomyopathy.   Sleep Apnea: - Individual verbal and written instruction to review Obstructive Sleep Apnea. Review of risk factors, methods for diagnosing and types of masks and machines for OSA.   Anxiety: - Provides group, verbal and written instruction on the correlation between heart/lung disease and anxiety, treatment options, and management of anxiety.   Relaxation: - Provides group, verbal and written instruction about the benefits of relaxation for patients with heart/lung disease. Also provides patients with examples of relaxation techniques.   Knowledge Questionnaire Score:     Knowledge Questionnaire Score - 05/06/15 1330    Knowledge Questionnaire Score   Pre Score 9/10       Personal Goals and Risk Factors at Admission:     Personal Goals and Risk Factors at Admission - 05/06/15 1330    Core Components/Risk Factors/Patient Goals on Admission   Sedentary Yes  Ms Cullen likes to work in her yard and use to [employer].   Intervention Provide advice, education, support and counseling about physical activity/exercise needs.;Develop an individualized exercise prescription for aerobic and resistive training based on initial evaluation findings, risk stratification, comorbidities and participant's personal goals.   Expected Outcomes Achievement of increased cardiorespiratory fitness and enhanced flexibility, muscular endurance and strength shown through measurements of functional capaciy and personal statement of participant.   Improve shortness of breath with ADL's Yes   Intervention Provide education, individualized exercise plan and daily activity instruction to help decrease symptoms of SOB with activities of daily living.   Expected Outcomes Short Term: Achieves a  reduction of symptoms when performing activities of daily living.   Develop more efficient breathing techniques such as purse lipped breathing and diaphragmatic breathing; and practicing self-pacing with activity Yes   Intervention Provide education, demonstration and support about specific breathing techniuqes utilized for more efficient breathing. Include techniques such as pursed lipped breathing, diaphragmatic breathing and self-pacing activity.   Expected Outcomes Short Term: Participant will be able to demonstrate and use breathing techniques as needed throughout daily activities.   Increase knowledge of respiratory medications and ability to use respiratory devices properly  Yes  Ms Ragsdale is using Advair, Spiriva, and Ventolin. She only uses her Ventolin when out and does not want to carry a spacer in her purse.  She wears 2l/m [place] at night only and uses Inogen.   Intervention Provide education and demonstration as needed of appropriate use of medications, inhalers, and oxygen therapy.   Expected Outcomes Short Term: Achieves understanding of medications use. Understands that oxygen is a medication prescribed by physician. Demonstrates appropriate use of inhaler and oxygen therapy.      Personal Goals and Risk Factors Review:    Personal Goals Discharge (Final Personal Goals and Risk Factors Review):    ITP Comments:   Comments:

## 2015-05-06 NOTE — Patient Instructions (Signed)
Patient Instructions  Patient Details  Name: [name] MRN: [id number] Date of Birth: [birth year] Referring Provider:  Vilinda Boehringer, MD  Below are the personal goals you chose as well as exercise and nutrition goals. Our goal is to help you keep on track towards obtaining and maintaining your goals. We will be discussing your progress on these goals with you throughout the program.  Initial Exercise Prescription:     Initial Exercise Prescription - 05/06/15 1600    Date of Initial Exercise Prescription   Date 05/06/15   Treadmill   MPH 1.5   Grade 0   Minutes 10   Recumbant Bike   Level 2   RPM 40   Watts 20   Minutes 10   NuStep   Level 2   Watts 40   Minutes 10   Arm Ergometer   Level 1   Watts 10   Minutes 10   Biostep-RELP   Level 2   Watts 40   Minutes 10   Prescription Details   Frequency (times per week) 3   Duration Progress to 30 minutes of continuous aerobic without signs/symptoms of physical distress   Intensity   THRR REST +  30   Ratings of Perceived Exertion 11-15   Perceived Dyspnea 0-4   Progression   Progression Continue progressive overload as per policy without signs/symptoms or physical distress.   Resistance Training   Training Prescription Yes   Weight 1   Reps 10-15      Exercise Goals: Frequency: Be able to perform aerobic exercise three times per week working toward 3-5 days per week.  Intensity: Work with a perceived exertion of 11 (fairly light) - 15 (hard) as tolerated. Follow your new exercise prescription and watch for changes in prescription as you progress with the program. Changes will be reviewed with you when they are made.  Duration: You should be able to do 30 minutes of continuous aerobic exercise in addition to a 5 minute warm-up and a 5 minute cool-down routine.  Nutrition Goals: Your personal nutrition goals will be established when you do your nutrition analysis with the dietician.  The following are  nutrition guidelines to follow: Cholesterol < '200mg'$ /day Sodium < '1500mg'$ /day Fiber: Women over 50 yrs - 21 grams per day  Personal Goals:     Personal Goals and Risk Factors at Admission - 05/06/15 1330    Core Components/Risk Factors/Patient Goals on Admission   Sedentary Yes  [name] likes to work in her yard and use to [employer].   Intervention Provide advice, education, support and counseling about physical activity/exercise needs.;Develop an individualized exercise prescription for aerobic and resistive training based on initial evaluation findings, risk stratification, comorbidities and participant's personal goals.   Expected Outcomes Achievement of increased cardiorespiratory fitness and enhanced flexibility, muscular endurance and strength shown through measurements of functional capaciy and personal statement of participant.   Improve shortness of breath with ADL's Yes   Intervention Provide education, individualized exercise plan and daily activity instruction to help decrease symptoms of SOB with activities of daily living.   Expected Outcomes Short Term: Achieves a reduction of symptoms when performing activities of daily living.   Develop more efficient breathing techniques such as purse lipped breathing and diaphragmatic breathing; and practicing self-pacing with activity Yes   Intervention Provide education, demonstration and support about specific breathing techniuqes utilized for more efficient breathing. Include techniques such as pursed lipped breathing, diaphragmatic breathing and self-pacing activity.   Expected  Outcomes Short Term: Participant will be able to demonstrate and use breathing techniques as needed throughout daily activities.   Increase knowledge of respiratory medications and ability to use respiratory devices properly  Yes  [name] is using Advair, Spiriva, and Ventolin. She only uses her Ventolin when out and does not want to carry a spacer in her purse.   She wears 2l/m [place] at night only and uses Inogen.   Intervention Provide education and demonstration as needed of appropriate use of medications, inhalers, and oxygen therapy.   Expected Outcomes Short Term: Achieves understanding of medications use. Understands that oxygen is a medication prescribed by physician. Demonstrates appropriate use of inhaler and oxygen therapy.      Tobacco Use Initial Evaluation: History  Smoking status  . Former Smoker -- 1.50 packs/day for 40 years  . Types: Cigarettes  . Quit date: 10/01/2001  Smokeless tobacco  . 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

## 2015-05-07 ENCOUNTER — Other Ambulatory Visit: Payer: Self-pay

## 2015-05-07 DIAGNOSIS — J438 Other emphysema: Secondary | ICD-10-CM

## 2015-05-07 DIAGNOSIS — R0602 Shortness of breath: Secondary | ICD-10-CM

## 2015-05-07 NOTE — Progress Notes (Signed)
Pulmonary Individual Treatment Plan  Patient Details  Name: [name] MRN: [id number] Date of Birth: 12-10-1933 Referring Provider:  Vilinda Boehringer, MD  Initial Encounter Date:       Pulmonary Rehab from 05/06/2015 in [facility] Cardiac and Pulmonary Rehab   Date  05/06/15      Visit Diagnosis: COPD, moderate ([place]) - Plan: PULMONARY REHAB 30 DAY REVIEW  Patient's Home Medications on Admission:  Current outpatient prescriptions:    ADVAIR DISKUS 250-50 MCG/DOSE AEPB, INHALE 1 PUFF TWICE DAILY.  RINSE MOUTH AFTER EACH USE, Disp: 60 each, Rfl: 3   aspirin 81 MG tablet, Take 81 mg by mouth daily., Disp: , Rfl:    Calcium Carb-Cholecalciferol (CALCIUM 600 + D PO), Take 1 tablet by mouth daily., Disp: , Rfl:    cholecalciferol (VITAMIN D) 1000 UNITS tablet, Take 1,000 Units by mouth daily., Disp: , Rfl:    levalbuterol (XOPENEX HFA) 45 MCG/ACT inhaler, Inhale into the lungs every 4 (four) hours as needed for wheezing., Disp: , Rfl:    LORazepam (ATIVAN) 1 MG tablet, Take 0.5 mg by mouth at bedtime. , Disp: , Rfl:    megestrol (MEGACE) 40 MG tablet, Take 40 mg by mouth daily., Disp: , Rfl:    mirtazapine (REMERON) 30 MG tablet, Take 1 tablet by mouth daily., Disp: , Rfl:    Multiple Vitamin (MULTIVITAMIN) tablet, Take 1 tablet by mouth daily., Disp: , Rfl:    omeprazole (PRILOSEC) 40 MG capsule, Take 1 capsule by mouth daily., Disp: , Rfl:    OXYGEN, Inhale into the lungs at bedtime., Disp: , Rfl:    rosuvastatin (CRESTOR) 20 MG tablet, Take 20 mg by mouth daily., Disp: , Rfl:    SPIRIVA HANDIHALER 18 MCG inhalation capsule, Place 18 mcg into inhaler and inhale daily. , Disp: , Rfl:   Past Medical History: Past Medical History  Diagnosis Date   Hyperlipidemia    Lung disease    GERD (gastroesophageal reflux disease)    Shortness of breath    Ovarian cyst 07/30/2014   Cancer (HCC)     lung, chemo    Tobacco Use: History  Smoking status   Former Smoker -- 1.50  packs/day for 40 years   Types: Cigarettes   Quit date: 10/01/2001  Smokeless tobacco   Former Systems developer    Labs: Recent Review Flowsheet Data    There is no flowsheet data to display.       ADL [facility]:     ADL [facility]      05/06/15 1330       ADL [facility]   ADL Phase Entry     SOB Score total 44     Rest 0     Walk 0     Stairs 4     Bath 1     Dress 1     Shop 2        Pulmonary Function Assessment:     Pulmonary Function Assessment - 05/06/15 1330    Pulmonary Function Tests   RV% 96 %   DLCO% 29 %   Initial Spirometry Results   FVC% 61 %   FEV1% 32 %   FEV1/FVC Ratio 40   Post Bronchodilator Spirometry Results   FVC% 65 %   FEV1% 34 %   FEV1/FVC Ratio 39   Comments Test date 12/25/2014   Breath   Bilateral Breath Sounds Decreased;Clear  Lt upper lobectomy   Shortness of Breath Yes;Limiting activity  Exercise Target Goals: Date: 05/06/15  Exercise Program Goal: Individual exercise prescription set with THRR, safety & activity barriers. Participant demonstrates ability to understand and report RPE using BORG scale, to self-measure pulse accurately, and to acknowledge the importance of the exercise prescription.  Exercise Prescription Goal: Starting with aerobic activity 30 plus minutes a day, 3 days per week for initial exercise prescription. Provide home exercise prescription and guidelines that participant acknowledges understanding prior to discharge.  Activity Barriers & Risk Stratification:     Activity Barriers & Cardiac Risk Stratification - 05/06/15 1330    Activity Barriers & Cardiac Risk Stratification   Activity Barriers Shortness of Breath;Deconditioning;Muscular Weakness      6 Minute Walk:     6 Minute Walk      05/06/15 1645       6 Minute Walk   Phase Initial     Distance 1125 feet     Walk Time 6 minutes     RPE 13     Perceived Dyspnea  4     Symptoms No     Resting HR 74 bpm     Resting BP 118/70 mmHg     Max Ex.  HR 96 bpm     Max Ex. BP 128/76 mmHg        Initial Exercise Prescription:     Initial Exercise Prescription - 05/06/15 1600    Date of Initial Exercise Prescription   Date 05/06/15   Treadmill   MPH 1.5   Grade 0   Minutes 10   Recumbant Bike   Level 2   RPM 40   Watts 20   Minutes 10   NuStep   Level 2   Watts 40   Minutes 10   Arm Ergometer   Level 1   Watts 10   Minutes 10   Biostep-RELP   Level 2   Watts 40   Minutes 10   Prescription Details   Frequency (times per week) 3   Duration Progress to 30 minutes of continuous aerobic without signs/symptoms of physical distress   Intensity   THRR REST +  30   Ratings of Perceived Exertion 11-15   Perceived Dyspnea 0-4   Progression   Progression Continue progressive overload as per policy without signs/symptoms or physical distress.   Resistance Training   Training Prescription Yes   Weight 1   Reps 10-15      Perform Capillary Blood Glucose checks as needed.  Exercise Prescription Changes:   Exercise Comments:   Discharge Exercise Prescription (Final Exercise Prescription Changes):    Nutrition:  Target Goals: Understanding of nutrition guidelines, daily intake of sodium '1500mg'$ , cholesterol '200mg'$ , calories 30% from fat and 7% or less from saturated fats, daily to have 5 or more servings of fruits and vegetables.  Biometrics:     Pre Biometrics - 05/06/15 1649    Pre Biometrics   Height 5' 2.5" (1.588 m)   Weight 108 lb (48.988 kg)   Waist Circumference 31 inches   Hip Circumference 31.5 inches   Waist to Hip Ratio 0.98 %   BMI (Calculated) 19.5       Nutrition Therapy Plan and Nutrition Goals:     Nutrition Therapy & Goals - 05/06/15 1330    Nutrition Therapy   Diet [name] prefers not to meet with the dietitian. She cooks healthy and eats small portion sizes.      Nutrition Discharge: Rate Your Plate Scores:   Psychosocial: Target  Goals: Acknowledge presence or absence of  depression, maximize coping skills, provide positive support system. Participant is able to verbalize types and ability to use techniques and skills needed for reducing stress and depression.  Initial Review & Psychosocial Screening:     Initial Psych Review & Screening - 05/06/15 1330    Family Dynamics   Good Support System? Yes   Comments [name] has support from her daughter. [name] read an article on depression and told her doctor she had the article's symptoms, mainly not wanting to go out or be involved with anyone. He put her on lexapro which helped.   Barriers   Psychosocial barriers to participate in program The patient should benefit from training in stress management and relaxation.   Screening Interventions   Interventions Program counselor consult;Encouraged to exercise      Quality of Life Scores:     Quality of Life - 05/06/15 1330    Quality of Life Scores   Health/Function Pre 15.07 %   Socioeconomic Pre 26 %   Psych/Spiritual Pre 19.43 %   Family Pre 19.5 %   GLOBAL Pre 18.63 %      PHQ-9:     Recent Review Flowsheet Data    Depression screen [facility] 2/9 05/06/2015   Decreased Interest 1   Down, Depressed, Hopeless 0   PHQ - 2 Score 1   Altered sleeping 0   Tired, decreased energy 1   Change in appetite 1   Feeling bad or failure about yourself  0   Trouble concentrating 0   Moving slowly or fidgety/restless 0   Suicidal thoughts 0   PHQ-9 Score 3   Difficult doing work/chores Somewhat difficult      Psychosocial Evaluation and Intervention:   Psychosocial Re-Evaluation:  Education: Education Goals: Education classes will be provided on a weekly basis, covering required topics. Participant will state understanding/return demonstration of topics presented.  Learning Barriers/Preferences:     Learning Barriers/Preferences - 05/06/15 1330    Learning Barriers/Preferences   Learning Barriers None   Learning Preferences Group  Instruction;Individual Instruction;Pictoral;Skilled Demonstration;Verbal Instruction;Video;Written Material      Education Topics: Initial Evaluation Education: - Verbal, written and demonstration of respiratory meds, RPE/PD scales, oximetry and breathing techniques. Instruction on use of nebulizers and MDIs: cleaning and proper use, rinsing mouth with steroid doses and importance of monitoring MDI activations.          Pulmonary Rehab from 05/06/2015 in [facility] Cardiac and Pulmonary Rehab   Date  05/06/15   Educator  LB   Instruction Review Code  2- meets goals/outcomes      General Nutrition Guidelines/Fats and Fiber: -Group instruction provided by verbal, written material, models and posters to present the general guidelines for heart healthy nutrition. Gives an explanation and review of dietary fats and fiber.   Controlling Sodium/Reading Food Labels: -Group verbal and written material supporting the discussion of sodium use in heart healthy nutrition. Review and explanation with models, verbal and written materials for utilization of the food label.   Exercise Physiology & Risk Factors: - Group verbal and written instruction with models to review the exercise physiology of the cardiovascular system and associated critical values. Details cardiovascular disease risk factors and the goals associated with each risk factor.   Aerobic Exercise & Resistance Training: - Gives group verbal and written discussion on the health impact of inactivity. On the components of aerobic and resistive training programs and the benefits of this training and how  to safely progress through these programs.   Flexibility, Balance, General Exercise Guidelines: - Provides group verbal and written instruction on the benefits of flexibility and balance training programs. Provides general exercise guidelines with specific guidelines to those with heart or lung disease. Demonstration and skill practice  provided.   Stress Management: - Provides group verbal and written instruction about the health risks of elevated stress, cause of high stress, and healthy ways to reduce stress.   Depression: - Provides group verbal and written instruction on the correlation between heart/lung disease and depressed mood, treatment options, and the stigmas associated with seeking treatment.   Exercise & Equipment Safety: - Individual verbal instruction and demonstration of equipment use and safety with use of the equipment.   Infection Prevention: - Provides verbal and written material to individual with discussion of infection control including proper hand washing and proper equipment cleaning during exercise session.   Falls Prevention: - Provides verbal and written material to individual with discussion of falls prevention and safety.      Pulmonary Rehab from 05/06/2015 in [facility] Cardiac and Pulmonary Rehab   Date  05/06/15   Educator  LB   Instruction Review Code  2- meets goals/outcomes      Diabetes: - Individual verbal and written instruction to review signs/symptoms of diabetes, desired ranges of glucose level fasting, after meals and with exercise. Advice that pre and post exercise glucose checks will be done for 3 sessions at entry of program.   Chronic Lung Diseases: - Group verbal and written instruction to review new updates, new respiratory medications, new advancements in procedures and treatments. Provide informative websites and "800" numbers of self-education.   Lung Procedures: - Group verbal and written instruction to describe testing methods done to diagnose lung disease. Review the outcome of test results. Describe the treatment choices: Pulmonary Function Tests, ABGs and oximetry.   Energy Conservation: - Provide group verbal and written instruction for methods to conserve energy, plan and organize activities. Instruct on pacing techniques, use of adaptive equipment and  posture/positioning to relieve shortness of breath.   Triggers: - Group verbal and written instruction to review types of environmental controls: home humidity, furnaces, filters, dust mite/pet prevention, HEPA vacuums. To discuss weather changes, air quality and the benefits of nasal washing.   Exacerbations: - Group verbal and written instruction to provide: warning signs, infection symptoms, calling MD promptly, preventive modes, and value of vaccinations. Review: effective airway clearance, coughing and/or vibration techniques. Create an Sports administrator.   Oxygen: - Individual and group verbal and written instruction on oxygen therapy. Includes supplement oxygen, available portable oxygen systems, continuous and intermittent flow rates, oxygen safety, concentrators, and Medicare reimbursement for oxygen.   Respiratory Medications: - Group verbal and written instruction to review medications for lung disease. Drug class, frequency, complications, importance of spacers, rinsing mouth after steroid MDI's, and proper cleaning methods for nebulizers.      Pulmonary Rehab from 05/06/2015 in [facility] Cardiac and Pulmonary Rehab   Date  05/06/15   Educator  LB   Instruction Review Code  2- meets goals/outcomes      AED/CPR: - Group verbal and written instruction with the use of models to demonstrate the basic use of the AED with the basic ABC's of resuscitation.   Breathing Retraining: - Provides individuals verbal and written instruction on purpose, frequency, and proper technique of diaphragmatic breathing and pursed-lipped breathing. Applies individual practice skills.      Pulmonary Rehab from 05/06/2015 in [facility]  Cardiac and Pulmonary Rehab   Date  05/06/15   Educator  LB   Instruction Review Code  2- meets goals/outcomes      Anatomy and Physiology of the Lungs: - Group verbal and written instruction with the use of models to provide basic lung anatomy and physiology related to function,  structure and complications of lung disease.   Heart Failure: - Group verbal and written instruction on the basics of heart failure: signs/symptoms, treatments, explanation of ejection fraction, enlarged heart and cardiomyopathy.   Sleep Apnea: - Individual verbal and written instruction to review Obstructive Sleep Apnea. Review of risk factors, methods for diagnosing and types of masks and machines for OSA.   Anxiety: - Provides group, verbal and written instruction on the correlation between heart/lung disease and anxiety, treatment options, and management of anxiety.   Relaxation: - Provides group, verbal and written instruction about the benefits of relaxation for patients with heart/lung disease. Also provides patients with examples of relaxation techniques.   Knowledge Questionnaire Score:     Knowledge Questionnaire Score - 05/06/15 1330    Knowledge Questionnaire Score   Pre Score 9/10       Personal Goals and Risk Factors at Admission:     Personal Goals and Risk Factors at Admission - 05/06/15 1330    Core Components/Risk Factors/Patient Goals on Admission   Sedentary Yes  [name] likes to work in her yard and use to [employer].   Intervention Provide advice, education, support and counseling about physical activity/exercise needs.;Develop an individualized exercise prescription for aerobic and resistive training based on initial evaluation findings, risk stratification, comorbidities and participant's personal goals.   Expected Outcomes Achievement of increased cardiorespiratory fitness and enhanced flexibility, muscular endurance and strength shown through measurements of functional capaciy and personal statement of participant.   Improve shortness of breath with ADL's Yes   Intervention Provide education, individualized exercise plan and daily activity instruction to help decrease symptoms of SOB with activities of daily living.   Expected Outcomes Short Term:  Achieves a reduction of symptoms when performing activities of daily living.   Develop more efficient breathing techniques such as purse lipped breathing and diaphragmatic breathing; and practicing self-pacing with activity Yes   Intervention Provide education, demonstration and support about specific breathing techniuqes utilized for more efficient breathing. Include techniques such as pursed lipped breathing, diaphragmatic breathing and self-pacing activity.   Expected Outcomes Short Term: Participant will be able to demonstrate and use breathing techniques as needed throughout daily activities.   Increase knowledge of respiratory medications and ability to use respiratory devices properly  Yes  [name] is using Advair, Spiriva, and Ventolin. She only uses her Ventolin when out and does not want to carry a spacer in her purse.  She wears 2l/m [place] at night only and uses Inogen.   Intervention Provide education and demonstration as needed of appropriate use of medications, inhalers, and oxygen therapy.   Expected Outcomes Short Term: Achieves understanding of medications use. Understands that oxygen is a medication prescribed by physician. Demonstrates appropriate use of inhaler and oxygen therapy.      Personal Goals and Risk Factors Review:    Personal Goals Discharge (Final Personal Goals and Risk Factors Review):    ITP Comments:   Comments: [name] plans to start [facility] on 05/12/2015 and will attend 3 days per week.

## 2015-05-09 ENCOUNTER — Ambulatory Visit
Admission: RE | Admit: 2015-05-09 | Discharge: 2015-05-09 | Disposition: A | Discharge status: Home / Self Care | Payer: PPO | Source: Ambulatory Visit | Attending: Internal Medicine | Admitting: Internal Medicine

## 2015-05-09 ENCOUNTER — Ambulatory Visit (INDEPENDENT_AMBULATORY_CARE_PROVIDER_SITE_OTHER): Payer: PPO | Admitting: Internal Medicine

## 2015-05-09 ENCOUNTER — Encounter: Payer: Self-pay | Admitting: Internal Medicine

## 2015-05-09 VITALS — BP 126/72 | HR 77 | Ht 61.0 in | Wt 108.2 lb

## 2015-05-09 DIAGNOSIS — R0600 Dyspnea, unspecified: Secondary | ICD-10-CM | POA: Diagnosis not present

## 2015-05-09 DIAGNOSIS — J438 Other emphysema: Secondary | ICD-10-CM

## 2015-05-09 DIAGNOSIS — R0602 Shortness of breath: Secondary | ICD-10-CM

## 2015-05-09 DIAGNOSIS — Z9889 Other specified postprocedural states: Secondary | ICD-10-CM | POA: Insufficient documentation

## 2015-05-09 DIAGNOSIS — J449 Chronic obstructive pulmonary disease, unspecified: Secondary | ICD-10-CM | POA: Diagnosis not present

## 2015-05-09 NOTE — Assessment & Plan Note (Signed)
Patient with long-standing history of COPD/emphysema.  With smoking in 2003, used to smoke 1.5 packs per day for 40 years. Very severe obstruction by PFTs, last PFT January 2014, FEV1 27% Currently on Advair and Spiriva. . Previously on a trial of Breo, but stopped due to tongue and throat swelling.   Plan: - cont with Advair, gargle and rinse after each use -Spiriva 1 capsule daily, gargle and rinse after each use -Pulmonary rehabilitation -Albuterol as needed for cough/shortness of breath/wheezing

## 2015-05-09 NOTE — Patient Instructions (Signed)
Follow up with Dr. Stevenson Clinch in:6 months - cont with your current inhalers - we will give you paperwork for patient assistance for Advair and Spiriva - incentive spirometry 10-15 times per day - continue to follow along with pulmonary rehab

## 2015-05-09 NOTE — Progress Notes (Signed)
[facility] Consultation    Date: 05/09/2015  MRN# [id number] [name] April 28, 1933  Referring Physician: Dr. Felipa Furnace PMD - Dr. Mable Fill [name] is a 82 y.o. old female seen in consultation for transition of care, COPD, SOB.   CC:  Chief Complaint  Patient presents with  . Follow-up    CXR today. pt. states breathing has improved. c/o SOB. denies cough, wheezing or chest pain/tightness. on 2L 02 @ bedtime.     HPI 12/25/2014:  Patient is a pleasant 82 year old female with a past medical history of COPD, lung cancer status post resection of the left upper lobe and adjuvant chemotherapy in 2006, hyperlipidemia, acid reflux disease, ovarian cyst, seen in consultation for transition of care from Dr. Gust Brooms office for COPD. Patient states she carries a diagnosis of COPD for a number of years, she weighs 2 L of oxygen at night, she is on Advair 250/50/50 one puff daily, Spiriva daily, and as needed albuterol. Patient states over the last 1-2 years her breathing is declined, major symptoms are shortness of breath especially with exertion, mild nonproductive cough, intermittent episodes of wheezing. Patient also endorses decreased energy level over the past year. She has severely obstructive disease by pulmonary function testing done at [facility]. She has a past surgical history of cholecystectomy 2014, left upper lobe resection for lung cancer in 2006. Over the past year she's had no hospitalization for COPD or COPD exacerbation. In June of this year she had a course of steroids and antibiotics (Levaquin) for a mild COPD exacerbation/bronchitis. She also stated throughout most of 2015 she had many dental procedures done, and after that her energy level started to drop, she felt very deconditioned. In the past she is attempted pulmonary debilitation, but stopped due to low energy in "just not feeling like it" which her daughter endorses. Patient is also  hard of hearing especially in the left ear. PLAN Plan: - Breo 200/25 one puff daily, gargle and rinse after each use -Spiriva 1 capsule daily, gargle and rinse after each use -Pulmonary rehabilitation -Albuterol as needed for cough/shortness of breath/wheezing - consider repeat chest CT in 1 year for pulmonary nodules.    ROV 02/2015 Patient presents today for follow-up visit of her COPD. Review of chart shows that she recently saw her primary care physician in December of last year, was started on antibiotics and steroids for an upper  respiratorytract infection with symptoms of nasal congestion, productive cough.  overall today patient states she is doing better, still has a dry cough with left-sided muscular skeletal rib discomfort.  at her last visit she was started on Breo,  But started having throat and tongue swelling, it was stopped and she was started on Advair.  review of records also showed that patient was fairly ill during the month of December, requiring prednisone and 3 rounds of antibiotics. Today she is still little fatigued from her recent upper story tract infection, Plan: - cont with Advair, gargle and rinse after each use -Spiriva 1 capsule daily, gargle and rinse after each use -Pulmonary rehabilitation -Albuterol as needed for cough/shortness of breath/wheezing -CXR prior to follow up visit due to recent URI.   Events since last clinic visit: Patient presents today for follow-up visit of her COPD. Since her last visit she's had no further hospitalizations or ED visits for COPD or any respiratory issues. She states that she is doing well today, most of her symptoms have resolved since January 2017.  Early this week she had a pulmonary rehabilitation evaluation, and she will start the program on Monday, 05/12/2015. Overall she says her energy is up, and she is ready to start pulmonary rehabilitation. No nausea, vomiting, fever, chills.  PMHX:   Past Medical History    Diagnosis Date  . Hyperlipidemia   . Lung disease   . GERD (gastroesophageal reflux disease)   . Shortness of breath   . Ovarian cyst 07/30/2014  . Cancer ([place])     lung, chemo   Surgical Hx:  Past Surgical History  Procedure Laterality Date  . Cholecystectomy  2014  . Lung surgery  2007    upper left lobe  . Tonsillectomy    . Breast biopsy Left 11-2012    neg., papilloma   Family Hx:  Family History  Problem Relation Age of Onset  . Cancer Daughter     breast  . Breast cancer Daughter 33   Social Hx:   Social History  Substance Use Topics  . Smoking status: Former Smoker -- 1.50 packs/day for 40 years    Types: Cigarettes    Quit date: 10/01/2001  . Smokeless tobacco: Former Systems developer  . Alcohol Use: Yes     Comment: socially   Medication:   Current Outpatient Rx  Name  Route  Sig  Dispense  Refill  . ADVAIR DISKUS 250-50 MCG/DOSE AEPB      INHALE 1 PUFF TWICE DAILY.  RINSE MOUTH AFTER EACH USE   60 each   3     PATIENT NEEDS REFILLS   . aspirin 81 MG tablet   Oral   Take 81 mg by mouth daily.         Marland Kitchen atorvastatin (LIPITOR) 20 MG tablet               . Calcium Carb-Cholecalciferol (CALCIUM 600 + D PO)   Oral   Take 1 tablet by mouth daily.         . cholecalciferol (VITAMIN D) 1000 UNITS tablet   Oral   Take 1,000 Units by mouth daily.         Marland Kitchen levalbuterol (XOPENEX HFA) 45 MCG/ACT inhaler   Inhalation   Inhale into the lungs every 4 (four) hours as needed for wheezing.         Marland Kitchen LORazepam (ATIVAN) 1 MG tablet   Oral   Take 0.5 mg by mouth at bedtime.          . mirtazapine (REMERON) 30 MG tablet   Oral   Take 1 tablet by mouth daily.         . Multiple Vitamin (MULTIVITAMIN) tablet   Oral   Take 1 tablet by mouth daily.         Marland Kitchen omeprazole (PRILOSEC) 40 MG capsule   Oral   Take 1 capsule by mouth daily.         . OXYGEN   Inhalation   Inhale into the lungs at bedtime.         . rosuvastatin (CRESTOR) 20 MG  tablet   Oral   Take 20 mg by mouth daily.         Marland Kitchen SPIRIVA HANDIHALER 18 MCG inhalation capsule   Inhalation   Place 18 mcg into inhaler and inhale daily.              Allergies:  Morphine and related; Sulfonamide derivatives; and Tetanus toxoid  Review of Systems  Constitutional: Negative for fever, chills and  weight loss.  HENT: Negative for congestion, ear discharge, hearing loss, nosebleeds and sore throat.   Eyes: Negative for blurred vision and double vision.  Respiratory: Positive for shortness of breath. Negative for cough, hemoptysis, sputum production, wheezing and stridor.        Chronic SOB, mildly improving.   Cardiovascular: Negative for chest pain and palpitations.  Gastrointestinal: Negative for nausea, vomiting and abdominal pain.  Genitourinary: Negative for dysuria.  Musculoskeletal: Negative for myalgias.  Skin: Negative for itching and rash.  Neurological: Negative for headaches.  Endo/Heme/Allergies: Does not bruise/bleed easily.     Physical Examination:   VS: BP 126/72 mmHg  Pulse 77  Ht '5\' 1"'$  (1.549 m)  Wt 108 lb 3.2 oz (49.079 kg)  BMI 20.45 kg/m2  SpO2 92%  General Appearance: No distress  Neuro:without focal findings, mental status, speech normal, alert and oriented, cranial nerves 2-12 intact, reflexes normal and symmetric, sensation grossly normal  HEENT: PERRLA, EOM intact, no ptosis, no other lesions noticed;  Pulmonary: Good airway entry, no significant adventitious breath sounds, mild decreased breath of the bases on the left greater than right.  CardiovascularNormal S1,S2.  No m/r/g.  Abdominal aorta pulsation normal.    Abdomen: Benign, Soft, non-tender, No masses, hepatosplenomegaly, No lymphadenopathy Renal:  No costovertebral tenderness  GU:  No performed at this time. Endoc: No evident thyromegaly, no signs of acromegaly or Cushing features Skin:   warm, no rashes, no ecchymosis  Extremities: normal, no cyanosis, clubbing,  no edema, warm with normal capillary refill. Other findings: None    Rad results: (The following images and results were reviewed by Dr. Stevenson Clinch on 05/09/2015). CT Chest 12/03/2014 FINDINGS: Mediastinum: The heart size is normal. There is no pericardial effusion identified. Aortic atherosclerosis. The trachea appears patent. Unremarkable appearance of the esophagus. No mediastinal or hilar adenopathy. No axillary or supraclavicular adenopathy.  Lungs/Pleura: No pleural fluid identified. Postop changes from wedge resection surgery involving the left upper lobe are noted. There are no specific findings to suggest local tumor recurrence within the left upper lung zone. Moderate changes of centrilobular emphysema identified. Stable 3 mm left lower lobe lung nodule, image 29/series 4. Also stable is a 4 mm left lower lobe lung nodule, image 33 of series 4. Solid right lower lobe lung nodule measures 4 mm, image 31 of series 4. Unchanged from previous exam. There is no new or enlarging suspicious nodule or mass. Peribronchovascular nodules are identified within the posterior right lower lobe. There is associated mild a bronchiectasis and bronchial wall thickening, image 44 of series 4. Likely infectious or inflammatory.  Upper Abdomen: Normal appearance of the adrenal glands. The visualized portions of the kidneys and spleen are also normal. Calcified granuloma identified within the central liver.  Musculoskeletal: No aggressive lytic or sclerotic bone lesion identified.  IMPRESSION: 1. Stable CT of the chest compared with 12/04/2013. 2. No significant change in solid-appearing nodules identified in both lungs which remain nonspecific. 3. Postinflammatory/infectious changes noted within the posterior right lower lobe. Similar to previous exam.    Pulmonary function testing 03/09/2012 FVC 56% FEV1 27% (0.5 L) and see his FEV1/FVC 36% TLC 146% VC 56% ERV 6% RV 252% DLCO is  33% Impression: Very severe obstruction by PFT  Pulmonary function testing 12/29/2010 FVC 81 FEV1 32% (0.62 L close ( FEV1/FVC 31% Vital capacity 81% TLC 129% DLCO 51% Impression: Very severe obstruction by PFT  CXR 05/09/15 EXAM: CHEST 2 VIEW  COMPARISON: Chest CT on 12/03/2014  FINDINGS:  The heart size and mediastinal contours are within normal limits. Pulmonary hyperinflation again seen, consistent COPD. Postop changes from left thoracotomy are seen with surgical staples in the left upper lobe. Right middle lobe atelectasis appears stable on lateral projection. No evidence of pulmonary consolidation or pleural effusion.  IMPRESSION: COPD. No acute findings.  Stable right middle lobe atelectasis and postsurgical changes in left hemithorax.  Assessment and Plan: 82 year old female with past medical history of lung cancer status post left upper lobe resection and adjuvant chemotherapy, COPD/emphysema, seen for follow-up visit today.  EMPHYSEMA Patient with long-standing history of COPD/emphysema.  With smoking in 2003, used to smoke 1.5 packs per day for 40 years. Very severe obstruction by PFTs, last PFT January 2014, FEV1 27% Currently on Advair and Spiriva. . Previously on a trial of Breo, but stopped due to tongue and throat swelling.   Plan: - cont with Advair, gargle and rinse after each use -Spiriva 1 capsule daily, gargle and rinse after each use -Pulmonary rehabilitation -Albuterol as needed for cough/shortness of breath/wheezing          Updated Medication List Outpatient Encounter Prescriptions as of 05/09/2015  Medication Sig  . ADVAIR DISKUS 250-50 MCG/DOSE AEPB INHALE 1 PUFF TWICE DAILY.  RINSE MOUTH AFTER EACH USE  . aspirin 81 MG tablet Take 81 mg by mouth daily.  Marland Kitchen atorvastatin (LIPITOR) 20 MG tablet   . Calcium Carb-Cholecalciferol (CALCIUM 600 + D PO) Take 1 tablet by mouth daily.  . cholecalciferol (VITAMIN D) 1000 UNITS tablet Take  1,000 Units by mouth daily.  Marland Kitchen levalbuterol (XOPENEX HFA) 45 MCG/ACT inhaler Inhale into the lungs every 4 (four) hours as needed for wheezing.  Marland Kitchen LORazepam (ATIVAN) 1 MG tablet Take 0.5 mg by mouth at bedtime.   . mirtazapine (REMERON) 30 MG tablet Take 1 tablet by mouth daily.  . Multiple Vitamin (MULTIVITAMIN) tablet Take 1 tablet by mouth daily.  Marland Kitchen omeprazole (PRILOSEC) 40 MG capsule Take 1 capsule by mouth daily.  . OXYGEN Inhale into the lungs at bedtime.  . rosuvastatin (CRESTOR) 20 MG tablet Take 20 mg by mouth daily.  Marland Kitchen SPIRIVA HANDIHALER 18 MCG inhalation capsule Place 18 mcg into inhaler and inhale daily.   . [DISCONTINUED] megestrol (MEGACE) 40 MG tablet Take 40 mg by mouth daily. Reported on 05/09/2015   No facility-administered encounter medications on file as of 05/09/2015.    Orders for this visit: Orders Placed This Encounter  Procedures  . Pulmonary function test    Standing Status: Future     Number of Occurrences:      Standing Expiration Date: 05/08/2016    Order Specific Question:  Where should this test be performed?    Answer:  [facility]    Order Specific Question:  Full PFT: includes the following: basic spirometry, spirometry pre & post bronchodilator, diffusion capacity (DLCO), lung volumes    Answer:  Full PFT     Thank  you for the consultation and for allowing [facility] to assist in the care of your patient. Our recommendations are noted above.  Please contact us if we can be of further service.   Vilinda Boehringer, MD [facility] Office Number: [phone]  Note: This note was prepared with Dragon dictation along with smaller phrase technology. Any transcriptional errors that result from this process are unintentional.

## 2015-05-12 ENCOUNTER — Encounter: Payer: PPO | Admitting: *Deleted

## 2015-05-12 DIAGNOSIS — J449 Chronic obstructive pulmonary disease, unspecified: Secondary | ICD-10-CM

## 2015-05-12 NOTE — Progress Notes (Signed)
Daily Session Note  Patient Details  Name: [name] MRN: [id number] Date of Birth: [birth year] Referring Provider:  Vilinda Boehringer, MD  Encounter Date: 05/12/2015  Check In:     Session Check In - 05/12/15 1052    Check-In   Location [facility]   Staff Present Candiss Norse, MS, ACSM CEP, Exercise Physiologist;Steven Way, BS, ACSM EP-C, Exercise Physiologist;Laureen Owens Shark, BS, RRT, Respiratory Therapist;Kelly Amedeo Plenty, BS, ACSM CEP, Exercise Physiologist   Supervising physician immediately available to respond to emergencies [facility] immediately available ER MD   Physician(s) Joni Fears and Jimmye Norman   Medication changes reported     No   Fall or balance concerns reported    No   Warm-up and Cool-down Performed on first and last piece of equipment   Resistance Training Performed Yes   VAD Patient? No   Pain Assessment   Currently in Pain? No/denies   Multiple Pain Sites No           Exercise Prescription Changes - 05/12/15 1000    Response to Exercise   Symptoms None   Comments First day of exercise! Patient was oriented to the gym and the equipment functions and settings. Procedures and policies of the gym were outlined and explained. The patient's individual exercise prescription and treatment plan were reviewed with them. All starting workloads were established based on the results of the functional testing  done at the initial intake visit. The plan for exercise progression was also introduced and progression will be customized based on the patient's performance and goals.    Duration Progress to 30 minutes of continuous aerobic without signs/symptoms of physical distress   Intensity Rest + 30   Progression   Progression Continue progressive overload as per policy without signs/symptoms or physical distress.   Resistance Training   Training Prescription Yes   Weight 1   Reps 10-15   Treadmill   MPH 1.5   Grade 0   Minutes 10   Recumbant Bike    Level 2   RPM 40   Watts 20   Minutes 10   NuStep   Level 2   Watts 40   Minutes 10   Arm Ergometer   Level 1   Watts 10   Minutes 10   Biostep-RELP   Level 2   Watts 40   Minutes 10      Goals Met:  Proper associated with RPD/PD & O2 Sat Exercise tolerated well Personal goals reviewed Queuing for purse lip breathing Strength training completed today  Goals Unmet:  Not Applicable  Comments: Patient completed exercise prescription and all exercise goals during rehab session. The exercise was tolerated well and the patient is progressing in the program.    Dr. Emily Filbert is Medical Director for [facility] and [facility].

## 2015-05-14 ENCOUNTER — Encounter: Payer: PPO | Admitting: *Deleted

## 2015-05-14 DIAGNOSIS — J449 Chronic obstructive pulmonary disease, unspecified: Secondary | ICD-10-CM | POA: Diagnosis not present

## 2015-05-14 NOTE — Progress Notes (Signed)
Daily Session Note  Patient Details  Name: [name] MRN: [id number] Date of Birth: September 12, 1933 Referring Provider:  Vilinda Boehringer, MD  Encounter Date: 05/14/2015  Check In:     Session Check In - 05/14/15 1127    Check-In   Location [facility]   Staff Present Candiss Norse, MS, ACSM CEP, Exercise Physiologist;Steven Way, BS, ACSM EP-C, Exercise Physiologist;Laureen Owens Shark, BS, RRT, Respiratory Therapist   Supervising physician immediately available to respond to emergencies [facility] immediately available ER MD   Physician(s) Burlene Arnt and Jimmye Norman   Medication changes reported     No   Fall or balance concerns reported    No   Warm-up and Cool-down Performed on first and last piece of equipment   Resistance Training Performed Yes   VAD Patient? No   Pain Assessment   Currently in Pain? No/denies   Multiple Pain Sites No         Goals Met:  Proper associated with RPD/PD & O2 Sat Independence with exercise equipment Using PLB without cueing & demonstrates good technique Exercise tolerated well Strength training completed today  Goals Unmet:  Not Applicable  Comments: Patient completed exercise prescription and all exercise goals during rehab session. The exercise was tolerated well and the patient is progressing in the program.    Dr. Emily Filbert is Medical Director for [place] and [facility].

## 2015-05-16 ENCOUNTER — Encounter: Payer: PPO | Admitting: *Deleted

## 2015-05-16 DIAGNOSIS — J449 Chronic obstructive pulmonary disease, unspecified: Secondary | ICD-10-CM

## 2015-05-16 NOTE — Progress Notes (Signed)
Daily Session Note  Patient Details  Name: [name] MRN: [id number] Date of Birth: 1933-10-07 Referring Provider:  Vilinda Boehringer, MD  Encounter Date: 05/16/2015  Check In:     Session Check In - 05/16/15 1103    Check-In   Location [facility]   Staff Present Candiss Norse, MS, ACSM CEP, Exercise Physiologist;Carroll Enterkin, RN, BSN;Stacey Blanch Media, RRT, RCP, Respiratory Therapist   Supervising physician immediately available to respond to emergencies [facility] immediately available ER MD   Physician(s) Edd Fabian and Schaevitz   Medication changes reported     No   Fall or balance concerns reported    No   Warm-up and Cool-down Performed on first and last piece of equipment   Resistance Training Performed Yes   VAD Patient? No   Pain Assessment   Currently in Pain? No/denies   Multiple Pain Sites No           Exercise Prescription Changes - 05/16/15 1100    Exercise Review   Progression Yes   Response to Exercise   Symptoms None   Comments Reviewed individualized exercise prescription and made increases per departmental policy. Exercise increases were discussed with the patient and they were able to perform the new work loads without issue (no signs or symptoms).    Duration Progress to 30 minutes of continuous aerobic without signs/symptoms of physical distress   Intensity Rest + 30   Progression   Progression Continue progressive overload as per policy without signs/symptoms or physical distress.   Resistance Training   Training Prescription Yes   Weight 1   Reps 10-15   Treadmill   MPH 1.2   Grade 0   Minutes 15  3, 5 min intervals   Recumbant Bike   Level 1   RPM 20   Minutes 10   NuStep   Level 2   Watts 20   Minutes 10   Biostep-RELP   Level 2   Watts 10   Minutes 10      Goals Met:  Proper associated with RPD/PD & O2 Sat Independence with exercise equipment Using PLB without cueing & demonstrates good  technique Exercise tolerated well Personal goals reviewed Strength training completed today  Goals Unmet:  Not Applicable  Comments: Patient completed exercise prescription and all exercise goals during rehab session. The exercise was tolerated well and the patient is progressing in the program.   Dr. Emily Filbert is Medical Director for [facility] and [facility].

## 2015-05-19 ENCOUNTER — Encounter: Payer: PPO | Attending: Internal Medicine | Admitting: *Deleted

## 2015-05-19 DIAGNOSIS — J449 Chronic obstructive pulmonary disease, unspecified: Secondary | ICD-10-CM | POA: Insufficient documentation

## 2015-05-19 NOTE — Progress Notes (Signed)
Daily Session Note  Patient Details  Name: [name] MRN: [id number] Date of Birth: [birth year] Referring Provider:  Vilinda Boehringer, MD  Encounter Date: 05/19/2015  Check In:     Session Check In - 05/19/15 1153    Check-In   Location [facility]   Staff Present Carson Myrtle, BS, RRT, Respiratory Therapist;Steven Way, BS, ACSM EP-C, Exercise Physiologist;Jonisha Kindig Amedeo Plenty, BS, ACSM CEP, Exercise Physiologist   Supervising physician immediately available to respond to emergencies [facility] immediately available ER MD   Physician(s) Corky Downs and Edd Fabian   Medication changes reported     No   Fall or balance concerns reported    No   Warm-up and Cool-down Performed on first and last piece of equipment   Resistance Training Performed Yes   VAD Patient? No   Pain Assessment   Currently in Pain? No/denies   Multiple Pain Sites No           Exercise Prescription Changes - 05/19/15 1100    Exercise Review   Progression Yes   Response to Exercise   Symptoms None   Comments Reviewed individualized exercise prescription and made increases per departmental policy. Exercise increases were discussed with the patient and they were able to perform the new work loads without issue (no signs or symptoms).    Duration Progress to 30 minutes of continuous aerobic without signs/symptoms of physical distress   Intensity Rest + 30   Progression   Progression Continue progressive overload as per policy without signs/symptoms or physical distress.   Resistance Training   Training Prescription Yes   Weight 1   Reps 10-15   Treadmill   MPH 1.2   Grade 0   Minutes 12  continuous walking with no breaks   Recumbant Bike   Level 1   RPM 20   Minutes 10   NuStep   Level 2   Watts 20   Minutes 10   Biostep-RELP   Level 2   Watts 10   Minutes 10      Goals Met:  Proper associated with RPD/PD & O2 Sat Independence with exercise equipment Exercise tolerated  well Personal goals reviewed Strength training completed today  Goals Unmet:  Not Applicable  Comments:    Dr. Emily Filbert is Medical Director for [facility] and [facility].

## 2015-05-21 ENCOUNTER — Encounter: Payer: PPO | Admitting: *Deleted

## 2015-05-21 DIAGNOSIS — J449 Chronic obstructive pulmonary disease, unspecified: Secondary | ICD-10-CM

## 2015-05-21 NOTE — Progress Notes (Signed)
Daily Session Note  Patient Details  Name: [name] MRN: [id number] Date of Birth: 1933/08/26 Referring Provider:  Vilinda Boehringer, MD  Encounter Date: 05/21/2015  Check In:     Session Check In - 05/21/15 1102    Check-In   Location [facility]   Staff Present Nyoka Cowden, Mauricia Area, BS, ACSM CEP, Exercise Physiologist;Laureen Owens Shark, BS, RRT, Respiratory Therapist;Zyonna Vardaman, RN, BSN   Supervising physician immediately available to respond to emergencies [facility] immediately available ER MD   Physician(s) Dr. Lovena Le and Dr. Jimmye Norman   Medication changes reported     No   Fall or balance concerns reported    No   Warm-up and Cool-down Performed on first and last piece of equipment   Resistance Training Performed Yes   VAD Patient? No   Pain Assessment   Currently in Pain? No/denies         Goals Met:  Proper associated with RPD/PD & O2 Sat Exercise tolerated well  Goals Unmet:  Not Applicable  Comments:    Dr. Emily Filbert is Medical Director for [place] and [facility].

## 2015-05-23 ENCOUNTER — Encounter: Payer: PPO | Admitting: *Deleted

## 2015-05-23 DIAGNOSIS — J449 Chronic obstructive pulmonary disease, unspecified: Secondary | ICD-10-CM

## 2015-05-23 NOTE — Progress Notes (Signed)
Daily Session Note  Patient Details  Name: [name] MRN: [id number] Date of Birth: 09/17/1933 Referring Provider:  Mungal, Vishal, MD  Encounter Date: 05/23/2015  Check In:     Session Check In - 05/23/15 1138    Check-In   Staff Present Susanne Bice, RN, BSN, CCRP;Carroll Enterkin, RN, BSN;Stacey Joyce, RRT, RCP, Respiratory Therapist   Supervising physician immediately available to respond to emergencies [facility] immediately available ER MD   Physician(s) Drs: Quigley and Shaevitz   Medication changes reported     No   Fall or balance concerns reported    No   Warm-up and Cool-down Performed on first and last piece of equipment   Resistance Training Performed Yes   VAD Patient? No   Pain Assessment   Currently in Pain? No/denies   Multiple Pain Sites No           Exercise Prescription Changes - 05/23/15 1100    Exercise Review   Progression Yes   Response to Exercise   Symptoms None   Comments Reviewed individualized exercise prescription and made increases per departmental policy. Exercise increases were discussed with the patient and they were able to perform the new work loads without issue (no signs or symptoms).    Duration Progress to 30 minutes of continuous aerobic without signs/symptoms of physical distress   Intensity Rest + 30   Progression   Progression Continue progressive overload as per policy without signs/symptoms or physical distress.   Resistance Training   Training Prescription Yes   Weight 1   Reps 10-15   Treadmill   MPH 1.2   Grade 0   Minutes 15  10min with break then 5 minutes   Recumbant Bike   Level 1   RPM 20   Minutes 10   NuStep   Level 2   Watts 15   Minutes 10   Biostep-RELP   Level 2   Watts 10   Minutes 10      Goals Met:  Proper associated with RPD/PD & O2 Sat Independence with exercise equipment Exercise tolerated well Personal goals reviewed Strength training completed today  Goals Unmet:  Not  Applicable  Comments: Doing well with exercise prescription progression.    Dr. Mark Miller is Medical Director for [facility] and [facility]. 

## 2015-05-26 ENCOUNTER — Encounter: Payer: PPO | Admitting: *Deleted

## 2015-05-26 DIAGNOSIS — J449 Chronic obstructive pulmonary disease, unspecified: Secondary | ICD-10-CM

## 2015-05-26 NOTE — Progress Notes (Signed)
Daily Session Note  Patient Details  Name: [name] MRN: [id number] Date of Birth: Oct 15, 1933 Referring Provider:  Vilinda Boehringer, MD  Encounter Date: 05/26/2015  Check In:     Session Check In - 05/26/15 1049    Check-In   Location [facility]   Staff Present Heath Lark, RN, BSN, CCRP;Adir Schicker, RN, Moises Blood, BS, ACSM CEP, Exercise Physiologist   Supervising physician immediately available to respond to emergencies [facility] immediately available ER MD   Physician(s) Dr. Mariea Clonts and Dr. Corky Downs   Medication changes reported     No   Fall or balance concerns reported    No   Warm-up and Cool-down Performed on first and last piece of equipment   Resistance Training Performed Yes   VAD Patient? No   Pain Assessment   Currently in Pain? No/denies         Goals Met:  Proper associated with RPD/PD & O2 Sat Exercise tolerated well  Goals Unmet:  Not Applicable  Comments:    Dr. Emily Filbert is Medical Director for [place] and [facility].

## 2015-05-28 ENCOUNTER — Encounter: Payer: PPO | Admitting: *Deleted

## 2015-05-28 DIAGNOSIS — J449 Chronic obstructive pulmonary disease, unspecified: Secondary | ICD-10-CM

## 2015-05-28 NOTE — Progress Notes (Signed)
Daily Session Note  Patient Details  Name: [name] MRN: [id number] Date of Birth: [birth year] Referring Provider:  Vilinda Boehringer, MD  Encounter Date: 05/28/2015  Check In:     Session Check In - 05/28/15 1127    Check-In   Staff Present Nyoka Cowden, RN;Hartwell Vandiver, RN, BSN, CCRP;Carroll Enterkin, RN, BSN   Supervising physician immediately available to respond to emergencies [facility] immediately available ER MD   Physician(s) Drs: Edd Fabian and Burlene Arnt   Medication changes reported     No   Fall or balance concerns reported    No   Warm-up and Cool-down Performed on first and last piece of equipment   VAD Patient? No   Pain Assessment   Currently in Pain? No/denies         Goals Met:  Independence with exercise equipment Exercise tolerated well Strength training completed today  Goals Unmet:  Not Applicable  Comments: Doing well with exercise prescription progression.    Dr. Emily Filbert is Medical Director for [facility] and [facility].

## 2015-05-30 ENCOUNTER — Encounter: Payer: PPO | Admitting: *Deleted

## 2015-05-30 ENCOUNTER — Encounter: Payer: Self-pay | Admitting: *Deleted

## 2015-05-30 DIAGNOSIS — J449 Chronic obstructive pulmonary disease, unspecified: Secondary | ICD-10-CM

## 2015-05-30 NOTE — Progress Notes (Signed)
Daily Session Note  Patient Details  Name: [name] MRN: [id number] Date of Birth: Jul 21, 1933 Referring Provider:    Encounter Date: 05/30/2015  Check In:     Session Check In - 05/30/15 1145    Check-In   Staff Present Nyoka Cowden, RN;Susanne Bice, RN, BSN, CCRP;Carroll Enterkin, RN, BSN   Supervising physician immediately available to respond to emergencies [facility] immediately available ER MD   Physician(s) Drs: Jacqualine Code and Joni Fears   Medication changes reported     No   Fall or balance concerns reported    No   Warm-up and Cool-down Performed on first and last piece of equipment   VAD Patient? No   Pain Assessment   Currently in Pain? No/denies         Goals Met:  Exercise tolerated well  Goals Unmet:  Not Applicable  Comments: Doing well with exercise prescription progression.    Dr. Emily Filbert is Medical Director for [place] and [facility].

## 2015-06-02 NOTE — Progress Notes (Signed)
Pulmonary Individual Treatment Plan  Patient Details  Name: [name] MRN: [id number] Date of Birth: 1933/06/18 Referring Provider:    Initial Encounter Date:       Pulmonary Rehab from 05/06/2015 in [facility] Cardiac and Pulmonary Rehab   Date  05/06/15      Visit Diagnosis: COPD, moderate ([place])  Patient's Home Medications on Admission:  Current outpatient prescriptions:  .  ADVAIR DISKUS 250-50 MCG/DOSE AEPB, INHALE 1 PUFF TWICE DAILY.  RINSE MOUTH AFTER EACH USE, Disp: 60 each, Rfl: 3 .  aspirin 81 MG tablet, Take 81 mg by mouth daily., Disp: , Rfl:  .  atorvastatin (LIPITOR) 20 MG tablet, , Disp: , Rfl:  .  Calcium Carb-Cholecalciferol (CALCIUM 600 + D PO), Take 1 tablet by mouth daily., Disp: , Rfl:  .  cholecalciferol (VITAMIN D) 1000 UNITS tablet, Take 1,000 Units by mouth daily., Disp: , Rfl:  .  levalbuterol (XOPENEX HFA) 45 MCG/ACT inhaler, Inhale into the lungs every 4 (four) hours as needed for wheezing., Disp: , Rfl:  .  LORazepam (ATIVAN) 1 MG tablet, Take 0.5 mg by mouth at bedtime. , Disp: , Rfl:  .  mirtazapine (REMERON) 30 MG tablet, Take 1 tablet by mouth daily., Disp: , Rfl:  .  Multiple Vitamin (MULTIVITAMIN) tablet, Take 1 tablet by mouth daily., Disp: , Rfl:  .  omeprazole (PRILOSEC) 40 MG capsule, Take 1 capsule by mouth daily., Disp: , Rfl:  .  OXYGEN, Inhale into the lungs at bedtime., Disp: , Rfl:  .  rosuvastatin (CRESTOR) 20 MG tablet, Take 20 mg by mouth daily., Disp: , Rfl:  .  SPIRIVA HANDIHALER 18 MCG inhalation capsule, Place 18 mcg into inhaler and inhale daily. , Disp: , Rfl:   Past Medical History: Past Medical History  Diagnosis Date  . Hyperlipidemia   . Lung disease   . GERD (gastroesophageal reflux disease)   . Shortness of breath   . Ovarian cyst 07/30/2014  . Cancer ([place])     lung, chemo    Tobacco Use: History  Smoking status  . Former Smoker -- 1.50 packs/day for 40 years  . Types: Cigarettes  . Quit date: 10/01/2001   Smokeless tobacco  . Former Geophysical data processor: Recent Review Flowsheet Data    There is no flowsheet data to display.       ADL [facility]SD:     Pulmonary Assessment Scores      05/06/15 1330       ADL UCSD   ADL Phase Entry     SOB Score total 44     Rest 0     Walk 0     Stairs 4     Bath 1     Dress 1     Shop 2        Pulmonary Function Assessment:     Pulmonary Function Assessment - 05/06/15 1330    Pulmonary Function Tests   RV% 96 %   DLCO% 29 %   Initial Spirometry Results   FVC% 61 %   FEV1% 32 %   FEV1/FVC Ratio 40   Post Bronchodilator Spirometry Results   FVC% 65 %   FEV1% 34 %   FEV1/FVC Ratio 39   Comments Test date 12/25/2014   Breath   Bilateral Breath Sounds Decreased;Clear  Lt upper lobectomy   Shortness of Breath Yes;Limiting activity      Exercise Target Goals:    Exercise Program Goal: Individual exercise prescription  set with THRR, safety & activity barriers. Participant demonstrates ability to understand and report RPE using BORG scale, to self-measure pulse accurately, and to acknowledge the importance of the exercise prescription.  Exercise Prescription Goal: Starting with aerobic activity 30 plus minutes a day, 3 days per week for initial exercise prescription. Provide home exercise prescription and guidelines that participant acknowledges understanding prior to discharge.  Activity Barriers & Risk Stratification:     Activity Barriers & Cardiac Risk Stratification - 05/06/15 1330    Activity Barriers & Cardiac Risk Stratification   Activity Barriers Shortness of Breath;Deconditioning;Muscular Weakness      6 Minute Walk:     6 Minute Walk      05/06/15 1645       6 Minute Walk   Phase Initial     Distance 1125 feet     Walk Time 6 minutes     RPE 13     Perceived Dyspnea  4     Symptoms No     Resting HR 74 bpm     Resting BP 118/70 mmHg     Max Ex. HR 96 bpm     Max Ex. BP 128/76 mmHg        Initial Exercise  Prescription:     Initial Exercise Prescription - 05/06/15 1600    Date of Initial Exercise RX and Referring Provider   Date 05/06/15   Treadmill   MPH 1.5   Grade 0   Minutes 10   Recumbant Bike   Level 2   RPM 40   Watts 20   Minutes 10   NuStep   Level 2   Watts 40   Minutes 10   Arm Ergometer   Level 1   Watts 10   Minutes 10   Biostep-RELP   Level 2   Watts 40   Minutes 10   Prescription Details   Frequency (times per week) 3   Duration Progress to 30 minutes of continuous aerobic without signs/symptoms of physical distress   Intensity   THRR REST +  30   Ratings of Perceived Exertion 11-15   Perceived Dyspnea 0-4   Progression   Progression Continue progressive overload as per policy without signs/symptoms or physical distress.   Resistance Training   Training Prescription Yes   Weight 1   Reps 10-15      Perform Capillary Blood Glucose checks as needed.  Exercise Prescription Changes:     Exercise Prescription Changes      05/12/15 1000 05/16/15 1100 05/19/15 1100 05/21/15 1100 05/23/15 1100   Exercise Review   Progression  Yes Yes Yes Yes   Response to Exercise   Blood Pressure (Admit) 104/66 mmHg       Blood Pressure (Exercise) 124/64 mmHg       Blood Pressure (Exit) 106/62 mmHg       Heart Rate (Admit) 81 bpm       Heart Rate (Exercise) 90 bpm       Heart Rate (Exit) 71 bpm       Oxygen Saturation (Admit) 94 %       Oxygen Saturation (Exercise) 91 %       Oxygen Saturation (Exit) 94 %       Rating of Perceived Exertion (Exercise) 15       Perceived Dyspnea (Exercise) 4       Symptoms _0    Comments First day of exercise! Patient was oriented to the  gym and the equipment functions and settings. Procedures and policies of the gym were outlined and explained. The patient's individual exercise prescription and treatment plan were reviewed with them. All starting workloads were established based on the results of the  functional testing  done at the initial intake visit. The plan for exercise progression was also introduced and progression will be customized based on the patient's performance and goals.  Reviewed individualized exercise prescription and made increases per departmental policy. Exercise increases were discussed with the patient and they were able to perform the new work loads without issue (no signs or symptoms).  Reviewed individualized exercise prescription and made increases per departmental policy. Exercise increases were discussed with the patient and they were able to perform the new work loads without issue (no signs or symptoms).  Reviewed individualized exercise prescription and made increases per departmental policy. Exercise increases were discussed with the patient and they were able to perform the new work loads without issue (no signs or symptoms).  Reviewed individualized exercise prescription and made increases per departmental policy. Exercise increases were discussed with the patient and they were able to perform the new work loads without issue (no signs or symptoms).    Duration Progress to 30 minutes of continuous aerobic without signs/symptoms of physical distress Progress to 30 minutes of continuous aerobic without signs/symptoms of physical distress Progress to 30 minutes of continuous aerobic without signs/symptoms of physical distress Progress to 30 minutes of continuous aerobic without signs/symptoms of physical distress Progress to 30 minutes of continuous aerobic without signs/symptoms of physical distress   Intensity Rest + 30 Rest + 30 Rest + 30 Rest + 30 Rest + 30   Progression   Progression Continue progressive overload as per policy without signs/symptoms or physical distress. Continue progressive overload as per policy without signs/symptoms or physical distress. Continue progressive overload as per policy without signs/symptoms or physical distress. Continue progressive  overload as per policy without signs/symptoms or physical distress. Continue progressive overload as per policy without signs/symptoms or physical distress.   Resistance Training   Training Prescription _0    Weight _1 Reps 10-15 10-15 10-15 10-15 10-15   Treadmill   MPH 1.5 1.2 1.2 1.2 1.2   Grade 0 0 0 0 0   Minutes 10 15  3, 5 min intervals 12  continuous walking with no breaks 15  17mn with break then 5 minutes 15  197m with break then 5 minutes   Recumbant Bike   Level -- _2 RPM -- _3 Watts --       Minutes -- _4 NuStep   Level _5 Watts _6 Minutes _7 Arm Ergometer   Level --       Watts --       Minutes --       Biostep-RELP   Level _8 Watts _9 Minutes _10 05/30/15 1200           Response to Exercise   Blood Pressure (Admit) 108/56 mmHg       Blood Pressure (Exercise) 122/82 mmHg       Blood Pressure (Exit) 120/70 mmHg  Heart Rate (Admit) 85 bpm       Heart Rate (Exercise) 85 bpm       Heart Rate (Exit) 70 bpm       Oxygen Saturation (Admit) 94 %  Room air       Oxygen Saturation (Exercise) 91 %  room air       Oxygen Saturation (Exit) 97 %  room air       Duration Progress to 30 minutes of continuous aerobic without signs/symptoms of physical distress       Intensity Rest + 30       Progression   Progression Continue to progress workloads to maintain intensity without signs/symptoms of physical distress.       Resistance Training   Training Prescription Yes       Weight 1       Reps 10-15       Treadmill   MPH 1.2       Grade 0       Minutes 12       Recumbant Bike   Level 1       RPM 15       Minutes 12       NuStep   Level 2       Watts 20       Minutes 15          Exercise Comments:     Exercise Comments      05/14/15 1000 05/19/15 1156         Exercise Comments Ms Sen loves to do yardwork.  She picks up sticks, trims shrubs, but is very careful to pace herself and use her PLB. Reviewed individualized exercise prescription and made increases per departmental policy. Exercise increases were discussed with the patient and they were able to perform the new work loads without issue (no signs or symptoms).          Discharge Exercise Prescription (Final Exercise Prescription Changes):     Exercise Prescription Changes - 05/30/15 1200    Response to Exercise   Blood Pressure (Admit) 108/56 mmHg   Blood Pressure (Exercise) 122/82 mmHg   Blood Pressure (Exit) 120/70 mmHg   Heart Rate (Admit) 85 bpm   Heart Rate (Exercise) 85 bpm   Heart Rate (Exit) 70 bpm   Oxygen Saturation (Admit) 94 %  Room air   Oxygen Saturation (Exercise) 91 %  room air   Oxygen Saturation (Exit) 97 %  room air   Duration Progress to 30 minutes of continuous aerobic without signs/symptoms of physical distress   Intensity Rest + 30   Progression   Progression Continue to progress workloads to maintain intensity without signs/symptoms of physical distress.   Resistance Training   Training Prescription Yes   Weight 1   Reps 10-15   Treadmill   MPH 1.2   Grade 0   Minutes 12   Recumbant Bike   Level 1   RPM 15   Minutes 12   NuStep   Level 2   Watts 20   Minutes 15       Nutrition:  Target Goals: Understanding of nutrition guidelines, daily intake of sodium '1500mg'$ , cholesterol '200mg'$ , calories 30% from fat and 7% or less from saturated fats, daily to have 5 or more servings of fruits and vegetables.  Biometrics:     Pre Biometrics - 05/06/15 1649    Pre Biometrics   Height 5' 2.5" (1.588 m)   Weight 108  lb (48.988 kg)   Waist Circumference 31 inches   Hip Circumference 31.5 inches   Waist to Hip Ratio 0.98 %   BMI (Calculated) 19.5       Nutrition Therapy Plan and Nutrition Goals:     Nutrition Therapy & Goals - 05/06/15 1330    Nutrition Therapy   Diet Ms Retz prefers not  to meet with the dietitian. She cooks healthy and eats small portion sizes.      Nutrition Discharge: Rate Your Plate Scores:   Psychosocial: Target Goals: Acknowledge presence or absence of depression, maximize coping skills, provide positive support system. Participant is able to verbalize types and ability to use techniques and skills needed for reducing stress and depression.  Initial Review & Psychosocial Screening:     Initial Psych Review & Screening - 05/06/15 1330    Family Dynamics   Good Support System? Yes   Comments Ms Ueda has support from her daughter. Ms Kunzman read an article on depression and told her doctor she had the article's symptoms, mainly not wanting to go out or be involved with anyone. He put her on lexapro which helped.   Barriers   Psychosocial barriers to participate in program The patient should benefit from training in stress management and relaxation.   Screening Interventions   Interventions Program counselor consult;Encouraged to exercise      Quality of Life Scores:     Quality of Life - 05/06/15 1330    Quality of Life Scores   Health/Function Pre 15.07 %   Socioeconomic Pre 26 %   Psych/Spiritual Pre 19.43 %   Family Pre 19.5 %   GLOBAL Pre 18.63 %      PHQ-9:     Recent Review Flowsheet Data    Depression screen [facility] 2/9 05/06/2015   Decreased Interest 1   Down, Depressed, Hopeless 0   PHQ - 2 Score 1   Altered sleeping 0   Tired, decreased energy 1   Change in appetite 1   Feeling bad or failure about yourself  0   Trouble concentrating 0   Moving slowly or fidgety/restless 0   Suicidal thoughts 0   PHQ-9 Score 3   Difficult doing work/chores Somewhat difficult      Psychosocial Evaluation and Intervention:     Psychosocial Evaluation - 05/12/15 1125    Psychosocial Evaluation & Interventions   Interventions Encouraged to exercise with the program and follow exercise prescription   Comments Counselor met with Ms.  Rooks for initial psychosocial evaluation today.  She is a 82 year old cancer survivor who had her left lung removed in 2006 due to lung cancer.  She lives alone but has a strong support system of a daughter who lives closeby; friends she plays [employer] with weekly and active involvement in her local church.  She states she sleeps well with the medication the Dr. prescribed for this issue and she admits to a history of depression and anxiety, but the Lexapro prescribed several months ago appears to be helping - no current symptoms.  Ms. Kidd states she is typically in a positive mood and has minimal stress in her life.  She has goals to breathe easier without fear.  She plans to join a follow up program at [facility] or attend her local [employer] silver sneakers class upon completion of this program.         Psychosocial Re-Evaluation:  Education: Education Goals: Education classes will be provided on a weekly basis,  covering required topics. Participant will state understanding/return demonstration of topics presented.  Learning Barriers/Preferences:     Learning Barriers/Preferences - 05/06/15 1330    Learning Barriers/Preferences   Learning Barriers None   Learning Preferences Group Instruction;Individual Instruction;Pictoral;Skilled Demonstration;Verbal Instruction;Video;Written Material      Education Topics: Initial Evaluation Education: - Verbal, written and demonstration of respiratory meds, RPE/PD scales, oximetry and breathing techniques. Instruction on use of nebulizers and MDIs: cleaning and proper use, rinsing mouth with steroid doses and importance of monitoring MDI activations.          Pulmonary Rehab from 05/30/2015 in [facility] Cardiac and Pulmonary Rehab   Date  05/06/15   Educator  LB   Instruction Review Code  2- meets goals/outcomes      General Nutrition Guidelines/Fats and Fiber: -Group instruction provided by verbal, written material, models and posters to present the  general guidelines for heart healthy nutrition. Gives an explanation and review of dietary fats and fiber.      Pulmonary Rehab from 05/30/2015 in [facility] Cardiac and Pulmonary Rehab   Date  05/19/15   Educator  CR   Instruction Review Code  2- meets goals/outcomes      Controlling Sodium/Reading Food Labels: -Group verbal and written material supporting the discussion of sodium use in heart healthy nutrition. Review and explanation with models, verbal and written materials for utilization of the food label.   Exercise Physiology & Risk Factors: - Group verbal and written instruction with models to review the exercise physiology of the cardiovascular system and associated critical values. Details cardiovascular disease risk factors and the goals associated with each risk factor.      Pulmonary Rehab from 05/30/2015 in [facility] Cardiac and Pulmonary Rehab   Date  05/14/15   Educator  SW   Instruction Review Code  2- meets goals/outcomes      Aerobic Exercise & Resistance Training: - Gives group verbal and written discussion on the health impact of inactivity. On the components of aerobic and resistive training programs and the benefits of this training and how to safely progress through these programs.   Flexibility, Balance, General Exercise Guidelines: - Provides group verbal and written instruction on the benefits of flexibility and balance training programs. Provides general exercise guidelines with specific guidelines to those with heart or lung disease. Demonstration and skill practice provided.   Stress Management: - Provides group verbal and written instruction about the health risks of elevated stress, cause of high stress, and healthy ways to reduce stress.   Depression: - Provides group verbal and written instruction on the correlation between heart/lung disease and depressed mood, treatment options, and the stigmas associated with seeking treatment.      Pulmonary Rehab from  05/30/2015 in [facility] Cardiac and Pulmonary Rehab   Date  05/21/15   Educator  Arlyss Queen   Instruction Review Code  2- meets goals/outcomes      Exercise & Equipment Safety: - Individual verbal instruction and demonstration of equipment use and safety with use of the equipment.      Pulmonary Rehab from 05/30/2015 in [facility] Cardiac and Pulmonary Rehab   Date  05/12/15   Educator  LB   Instruction Review Code  2- meets goals/outcomes      Infection Prevention: - Provides verbal and written material to individual with discussion of infection control including proper hand washing and proper equipment cleaning during exercise session.      Pulmonary Rehab from 05/30/2015 in [facility] Cardiac and Pulmonary Rehab  Date  05/12/15   Educator  LB   Instruction Review Code  2- meets goals/outcomes      Falls Prevention: - Provides verbal and written material to individual with discussion of falls prevention and safety.      Pulmonary Rehab from 05/30/2015 in [facility] Cardiac and Pulmonary Rehab   Date  05/06/15   Educator  LB   Instruction Review Code  2- meets goals/outcomes      Diabetes: - Individual verbal and written instruction to review signs/symptoms of diabetes, desired ranges of glucose level fasting, after meals and with exercise. Advice that pre and post exercise glucose checks will be done for 3 sessions at entry of program.   Chronic Lung Diseases: - Group verbal and written instruction to review new updates, new respiratory medications, new advancements in procedures and treatments. Provide informative websites and "800" numbers of self-education.   Lung Procedures: - Group verbal and written instruction to describe testing methods done to diagnose lung disease. Review the outcome of test results. Describe the treatment choices: Pulmonary Function Tests, ABGs and oximetry.   Energy Conservation: - Provide group verbal and written instruction for methods to conserve energy,  plan and organize activities. Instruct on pacing techniques, use of adaptive equipment and posture/positioning to relieve shortness of breath.   Triggers: - Group verbal and written instruction to review types of environmental controls: home humidity, furnaces, filters, dust mite/pet prevention, HEPA vacuums. To discuss weather changes, air quality and the benefits of nasal washing.   Exacerbations: - Group verbal and written instruction to provide: warning signs, infection symptoms, calling MD promptly, preventive modes, and value of vaccinations. Review: effective airway clearance, coughing and/or vibration techniques. Create an Sports administrator.   Oxygen: - Individual and group verbal and written instruction on oxygen therapy. Includes supplement oxygen, available portable oxygen systems, continuous and intermittent flow rates, oxygen safety, concentrators, and Medicare reimbursement for oxygen.   Respiratory Medications: - Group verbal and written instruction to review medications for lung disease. Drug class, frequency, complications, importance of spacers, rinsing mouth after steroid MDI's, and proper cleaning methods for nebulizers.      Pulmonary Rehab from 05/30/2015 in [facility] Cardiac and Pulmonary Rehab   Date  05/06/15   Educator  LB   Instruction Review Code  2- meets goals/outcomes      AED/CPR: - Group verbal and written instruction with the use of models to demonstrate the basic use of the AED with the basic ABC's of resuscitation.   Breathing Retraining: - Provides individuals verbal and written instruction on purpose, frequency, and proper technique of diaphragmatic breathing and pursed-lipped breathing. Applies individual practice skills.      Pulmonary Rehab from 05/30/2015 in [facility] Cardiac and Pulmonary Rehab   Date  05/06/15   Educator  LB   Instruction Review Code  2- meets goals/outcomes      Anatomy and Physiology of the Lungs: - Group verbal and written  instruction with the use of models to provide basic lung anatomy and physiology related to function, structure and complications of lung disease.   Heart Failure: - Group verbal and written instruction on the basics of heart failure: signs/symptoms, treatments, explanation of ejection fraction, enlarged heart and cardiomyopathy.   Sleep Apnea: - Individual verbal and written instruction to review Obstructive Sleep Apnea. Review of risk factors, methods for diagnosing and types of masks and machines for OSA.   Anxiety: - Provides group, verbal and written instruction on the correlation between heart/lung disease and  anxiety, treatment options, and management of anxiety.   Relaxation: - Provides group, verbal and written instruction about the benefits of relaxation for patients with heart/lung disease. Also provides patients with examples of relaxation techniques.   Knowledge Questionnaire Score:     Knowledge Questionnaire Score - 05/06/15 1330    Knowledge Questionnaire Score   Pre Score 9/10       Core Components/Risk Factors/Patient Goals at Admission:     Personal Goals and Risk Factors at Admission - 05/06/15 1330    Core Components/Risk Factors/Patient Goals on Admission   Sedentary Yes  Ms Adcox likes to work in her yard and use to [employer].   Intervention Provide advice, education, support and counseling about physical activity/exercise needs.;Develop an individualized exercise prescription for aerobic and resistive training based on initial evaluation findings, risk stratification, comorbidities and participant's personal goals.   Expected Outcomes Achievement of increased cardiorespiratory fitness and enhanced flexibility, muscular endurance and strength shown through measurements of functional capaciy and personal statement of participant.   Improve shortness of breath with ADL's Yes   Intervention Provide education, individualized exercise plan and daily activity  instruction to help decrease symptoms of SOB with activities of daily living.   Expected Outcomes Short Term: Achieves a reduction of symptoms when performing activities of daily living.   Develop more efficient breathing techniques such as purse lipped breathing and diaphragmatic breathing; and practicing self-pacing with activity Yes   Intervention Provide education, demonstration and support about specific breathing techniuqes utilized for more efficient breathing. Include techniques such as pursed lipped breathing, diaphragmatic breathing and self-pacing activity.   Expected Outcomes Short Term: Participant will be able to demonstrate and use breathing techniques as needed throughout daily activities.   Increase knowledge of respiratory medications and ability to use respiratory devices properly  Yes  Ms Zirkle is using Advair, Spiriva, and Ventolin. She only uses her Ventolin when out and does not want to carry a spacer in her purse.  She wears 2l/m Christopher at night only and uses [employer].   Intervention Provide education and demonstration as needed of appropriate use of medications, inhalers, and oxygen therapy.   Expected Outcomes Short Term: Achieves understanding of medications use. Understands that oxygen is a medication prescribed by physician. Demonstrates appropriate use of inhaler and oxygen therapy.      Core Components/Risk Factors/Patient Goals Review:      Goals and Risk Factor Review      05/12/15 1600 05/14/15 1000 05/23/15 1248 05/23/15 1252     Core Components/Risk Factors/Patient Goals Review   Personal Goals Review Develop more efficient breathing techniques such as purse lipped breathing and diaphragmatic breathing and practicing self-pacing with activity. Develop more efficient breathing techniques such as purse lipped breathing and diaphragmatic breathing and practicing self-pacing with activity. Improve shortness of breath with ADL's Increase knowledge of respiratory  medications and ability to use respiratory devices properly.    Review Ms Deist used PLB with her exercise goals and used good technique. Ms Hollars states she uses PLB with activity at home, especially her yardwork.  Ms. Storts says that she has a scratchy throat today.  We discussed the importance of an antihistimine during the spring months and the signs and symptoms of infection. Ms. Milham takes Ventolin as a rescue inhaler.  Spacer was given to her today.    Expected Outcomes Continue using PLB during exercise goals and with activites at home for the continuation of [facility] with out cueing. Continue using the PLB,  especially with her activites outside. Ms. Sautter says that she will watch this closely and will start taking an antihistimine if the scratchy throat continues to get worse.  This medication should help with her drainage and scratchy throat. The spacer will help her inhale more of her medication and get quicker relief from her rescue inhaler.       Core Components/Risk Factors/Patient Goals at Discharge (Final Review):      Goals and Risk Factor Review - 05/23/15 1252    Core Components/Risk Factors/Patient Goals Review   Personal Goals Review Increase knowledge of respiratory medications and ability to use respiratory devices properly.   Review Ms. Strothman takes Ventolin as a rescue inhaler.  Spacer was given to her today.   Expected Outcomes The spacer will help her inhale more of her medication and get quicker relief from her rescue inhaler.      ITP Comments:   Comments: 30 day review

## 2015-06-04 ENCOUNTER — Encounter: Payer: PPO | Admitting: *Deleted

## 2015-06-04 DIAGNOSIS — J449 Chronic obstructive pulmonary disease, unspecified: Secondary | ICD-10-CM

## 2015-06-04 NOTE — Progress Notes (Signed)
Daily Session Note  Patient Details  Name: [name] MRN: [id number] Date of Birth: 1933-05-12 Referring Provider:    Encounter Date: 06/04/2015  Check In:     Session Check In - 06/04/15 1109    Check-In   Staff Present Heath Lark, RN, BSN, CCRP;Laureen Owens Shark, BS, RRT, Respiratory Therapist;Rebecca Brayton El, DPT, [place] physician immediately available to respond to emergencies [facility] immediately available ER MD   Physician(s) Drs: Dineen Kid and Edd Fabian   Medication changes reported     No   Fall or balance concerns reported    No   Warm-up and Cool-down Performed on first and last piece of equipment   VAD Patient? No   Pain Assessment   Currently in Pain? No/denies         Goals Met:  Exercise tolerated well Strength training completed today  Goals Unmet:  Not Applicable  Comments: Doing well with exercise prescription progression.    Dr. Emily Filbert is Medical Director for [place] and [facility].

## 2015-06-06 ENCOUNTER — Encounter: Payer: PPO | Admitting: *Deleted

## 2015-06-06 DIAGNOSIS — J449 Chronic obstructive pulmonary disease, unspecified: Secondary | ICD-10-CM | POA: Diagnosis not present

## 2015-06-06 NOTE — Progress Notes (Signed)
Daily Session Note  Patient Details  Name: [name] MRN: [id number] Date of Birth: 01/08/1934 Referring Provider:    Encounter Date: 06/06/2015  Check In:     Session Check In - 06/06/15 1154    Check-In   Location [facility]   Staff Present Heath Lark, RN, BSN, CCRP;Bevan Vu, RN, BSN   Supervising physician immediately available to respond to emergencies [facility] immediately available ER MD   Physician(s) Emergency planning/management officer and [employer]   Fall or balance concerns reported    No   Warm-up and Cool-down Performed on first and last piece of equipment   Resistance Training Performed Yes   VAD Patient? No   Pain Assessment   Currently in Pain? No/denies         Goals Met:  Proper associated with RPD/PD & O2 Sat Exercise tolerated well  Goals Unmet:  Not Applicable  Comments:    Dr. Emily Filbert is Medical Director for [place] and [facility].

## 2015-06-09 DIAGNOSIS — J449 Chronic obstructive pulmonary disease, unspecified: Secondary | ICD-10-CM | POA: Diagnosis not present

## 2015-06-09 NOTE — Progress Notes (Signed)
Daily Session Note  Patient Details  Name: [name] MRN: [id number] Date of Birth: 1933/12/16 Referring Provider:    Encounter Date: 06/09/2015  Check In:     Session Check In - 06/09/15 1220    Check-In   Location [facility]   Staff Present Jeanell Sparrow, DPT, CEEA;Laureen Owens Shark BS, RRT, Respiratory Therapist   Supervising physician immediately available to respond to emergencies [facility] immediately available ER MD   Physician(s) Marcelene Butte, [place]   Medication changes reported     No   Fall or balance concerns reported    No   Warm-up and Cool-down Performed on first and last piece of equipment   Resistance Training Performed Yes   VAD Patient? No         Goals Met:  Independence with exercise equipment Exercise tolerated well  Goals Unmet:  Not Applicable  Comments: Patient completed exercise prescription and all exercise goals during rehab session. The exercise was tolerated well and the patient is progressing in the program.    Dr. Emily Filbert is Medical Director for [place] and [facility].

## 2015-06-11 ENCOUNTER — Encounter: Payer: PPO | Admitting: *Deleted

## 2015-06-11 ENCOUNTER — Inpatient Hospital Stay: Payer: PPO

## 2015-06-11 ENCOUNTER — Inpatient Hospital Stay: Payer: PPO | Admitting: Family Medicine

## 2015-06-11 DIAGNOSIS — J449 Chronic obstructive pulmonary disease, unspecified: Secondary | ICD-10-CM | POA: Diagnosis not present

## 2015-06-11 NOTE — Progress Notes (Signed)
Daily Session Note  Patient Details  Name: [name] MRN: [id number] Date of Birth: [birth year] Referring Provider:    Encounter Date: 06/11/2015  Check In:     Session Check In - 06/11/15 1157    Check-In   Staff Present Heath Lark, RN, BSN, CCRP;Laureen Owens Shark, BS, RRT, Respiratory Therapist;Rebecca Brayton El, DPT, [place] physician immediately available to respond to emergencies [facility] immediately available ER MD   Physician(s) Drs: Cinda Quest and Joni Fears   Medication changes reported     No   Fall or balance concerns reported    No   Warm-up and Cool-down Performed on first and last piece of equipment   VAD Patient? No   Pain Assessment   Currently in Pain? No/denies           Exercise Prescription Changes - 06/11/15 1100    Response to Exercise   Oxygen Saturation (Admit) --  Room air   Oxygen Saturation (Exercise) --  room air   Oxygen Saturation (Exit) --  room air   Duration Progress to 30 minutes of continuous aerobic without signs/symptoms of physical distress   Intensity Rest + 30   Progression   Progression Continue to progress workloads to maintain intensity without signs/symptoms of physical distress.   Resistance Training   Training Prescription Yes   Weight 1   Reps 10-15   Treadmill   MPH 1.3   Grade 0   Minutes 15   Recumbant Bike   Level 1   RPM 15   Minutes 12   NuStep   Level 3   Watts 40   Minutes 15   Biostep-RELP   Level 2   Watts 10   Minutes 10      Goals Met:  Exercise tolerated well Personal goals reviewed Queuing for purse lip breathing Strength training completed today  Goals Unmet:  Not Applicable  Comments: Doing well with exercise prescription progression. Reminded to use purselipped breathing while on the treadmill.   Dr. Emily Filbert is Medical Director for [place] and [facility].

## 2015-06-13 ENCOUNTER — Encounter: Payer: PPO | Admitting: Respiratory Therapy

## 2015-06-13 DIAGNOSIS — J449 Chronic obstructive pulmonary disease, unspecified: Secondary | ICD-10-CM

## 2015-06-13 NOTE — Progress Notes (Signed)
Daily Session Note  Patient Details  Name: [name] MRN: [id number] Date of Birth: Jan 07, 1934 Referring Provider:    Encounter Date: 06/13/2015  Check In:     Session Check In - 06/13/15 1119    Check-In   Staff Present Nyoka Cowden, RN;Susanne Bice, RN, BSN, CCRP;Cherrish Vitali Blanch Media, RRT, RCP, Respiratory Therapist   Supervising physician immediately available to respond to emergencies [facility] immediately available ER MD   Physician(s) Dr. Edd Fabian and Dr. Jimmye Norman   Medication changes reported     No   Fall or balance concerns reported    No   Warm-up and Cool-down Performed on first and last piece of equipment   Resistance Training Performed Yes   VAD Patient? No   Pain Assessment   Currently in Pain? No/denies         Goals Met:  Proper associated with RPD/PD & O2 Sat Independence with exercise equipment Exercise tolerated well Strength training completed today  Goals Unmet:  Not Applicable  Comments:    Dr. Emily Filbert is Medical Director for [place] and [facility].

## 2015-06-16 ENCOUNTER — Encounter: Payer: PPO | Attending: Internal Medicine | Admitting: *Deleted

## 2015-06-16 DIAGNOSIS — J449 Chronic obstructive pulmonary disease, unspecified: Secondary | ICD-10-CM | POA: Diagnosis not present

## 2015-06-16 NOTE — Progress Notes (Signed)
Daily Session Note  Patient Details  Name: [name] MRN: [id number] Date of Birth: 01-03-1934 Referring Provider:    Encounter Date: 06/16/2015  Check In:     Session Check In - 06/16/15 1223    Check-In   Location [facility]   Staff Present Gerlene Burdock, RN, BSN;Laureen Owens Shark, BS, RRT, Respiratory Therapist;Zedrick Springsteen Amedeo Plenty, BS, ACSM CEP, Exercise Physiologist   Supervising physician immediately available to respond to emergencies [facility] immediately available ER MD   Physician(s) Joni Fears and Burlene Arnt   Medication changes reported     No   Fall or balance concerns reported    No   Warm-up and Cool-down Performed on first and last piece of equipment   Resistance Training Performed Yes   VAD Patient? No   Pain Assessment   Currently in Pain? No/denies   Multiple Pain Sites No         Goals Met:  Proper associated with RPD/PD & O2 Sat Independence with exercise equipment Exercise tolerated well Strength training completed today  Goals Unmet:  Not Applicable  Comments: Patient completed exercise prescription and all exercise goals during rehab session. The exercise was tolerated well and the patient is progressing in the program.     Dr. Emily Filbert is Medical Director for [place] and [facility].

## 2015-06-18 ENCOUNTER — Encounter: Payer: PPO | Admitting: *Deleted

## 2015-06-18 DIAGNOSIS — H16223 Keratoconjunctivitis sicca, not specified as Sjogren's, bilateral: Secondary | ICD-10-CM | POA: Diagnosis not present

## 2015-06-18 DIAGNOSIS — J449 Chronic obstructive pulmonary disease, unspecified: Secondary | ICD-10-CM

## 2015-06-18 DIAGNOSIS — H2513 Age-related nuclear cataract, bilateral: Secondary | ICD-10-CM | POA: Diagnosis not present

## 2015-06-18 NOTE — Progress Notes (Signed)
Daily Session Note  Patient Details  Name: ANNASOPHIA CROCKER MRN: [id number] Date of Birth: [birth year] Referring Provider:    Encounter Date: 06/18/2015  Check In:     Session Check In - 06/18/15 1236    Check-In   Staff Present Heath Lark, RN, BSN, CCRP;Laureen Owens Shark, BS, RRT, Respiratory Orlie Dakin, RN, BSN   Supervising physician immediately available to respond to emergencies [facility] immediately available ER MD   Physician(s) Drs: Jimmye Norman and Reita Cliche   Medication changes reported     No   Fall or balance concerns reported    No   Warm-up and Cool-down Performed on first and last piece of equipment   VAD Patient? No   Pain Assessment   Currently in Pain? No/denies         Goals Met:  Independence with exercise equipment Exercise tolerated well Strength training completed today  Goals Unmet:  Not Applicable  Comments: Doing well with exercise prescription progression.    Dr. Emily Filbert is Medical Director for [place] and [facility].

## 2015-06-20 ENCOUNTER — Encounter: Payer: PPO | Admitting: *Deleted

## 2015-06-20 DIAGNOSIS — J449 Chronic obstructive pulmonary disease, unspecified: Secondary | ICD-10-CM | POA: Diagnosis not present

## 2015-06-20 NOTE — Progress Notes (Signed)
Daily Session Note  Patient Details  Name: PHOENYX MELKA MRN: [id number] Date of Birth: 1933-07-25 Referring Provider:    Encounter Date: 06/20/2015  Check In:     Session Check In - 06/20/15 1135    Check-In   Staff Present Heath Lark, RN, BSN, CCRP;Carroll Enterkin, RN, BSN;Stacey Blanch Media, RRT, RCP, Respiratory Therapist   Supervising physician immediately available to respond to emergencies [facility] immediately available ER MD   Physician(s)  Marcee stated that she was able to work in garden longer yesterday. Attributes this to her  Pulmoanry Rehab exercise routine helping with improved stamina. She enjoys working in the garden and was happy that she was able to do this yesterday   Medication changes reported     No   Fall or balance concerns reported    No   Warm-up and Cool-down Performed on first and last piece of equipment   VAD Patient? No   Pain Assessment   Currently in Pain? No/denies         Goals Met:  Independence with exercise equipment Exercise tolerated well Personal goals reviewed Strength training completed today  Goals Unmet:  Not Applicable  Comments: Doing well with exercise prescription progression. See exercise comments   Dr. Emily Filbert is Medical Director for [place] and [facility].

## 2015-06-23 ENCOUNTER — Encounter: Payer: PPO | Admitting: Respiratory Therapy

## 2015-06-23 DIAGNOSIS — J449 Chronic obstructive pulmonary disease, unspecified: Secondary | ICD-10-CM

## 2015-06-23 NOTE — Progress Notes (Signed)
Daily Session Note  Patient Details  Name: [name] MRN: [id number] Date of Birth: 07-Nov-1933 Referring Provider:    Encounter Date: 06/23/2015  Check In:     Session Check In - 06/23/15 1549    Check-In   Location [facility]   Staff Present Carson Myrtle, BS, RRT, Respiratory Therapist;Diane Joya Gaskins, RN, BSN;Carroll Enterkin, RN, BSN   Supervising physician immediately available to respond to emergencies [facility] immediately available ER MD   Physician(s) Reita Cliche and Event organiser   Medication changes reported     No   Fall or balance concerns reported    No   Warm-up and Cool-down Performed on first and last piece of Scientist, physiological Performed Yes   VAD Patient? No           Exercise Prescription Changes - 06/23/15 1500    Exercise Review   Progression Yes   Response to Exercise   Blood Pressure (Admit) 118/60 mmHg   Blood Pressure (Exercise) 126/74 mmHg   Blood Pressure (Exit) 122/72 mmHg   Heart Rate (Admit) 70 bpm   Heart Rate (Exercise) 90 bpm   Heart Rate (Exit) 76 bpm   Oxygen Saturation (Admit) 95 %  Room air   Oxygen Saturation (Exercise) 90 %  room air   Oxygen Saturation (Exit) 94 %  room air   Rating of Perceived Exertion (Exercise) 11   Perceived Dyspnea (Exercise) 3   Symptoms None   Duration Progress to 50 minutes of aerobic without signs/symptoms of physical distress   Intensity Rest + 30   Progression   Progression Continue to progress workloads to maintain intensity without signs/symptoms of physical distress.   Resistance Training   Training Prescription Yes   Weight 1   Reps 10-15   Treadmill   MPH 1.3   Grade 0   Minutes 20   NuStep   Level 3   Watts 40   Minutes 15   Biostep-RELP   Level 2   Watts 15   Minutes 15      Goals Met:  Proper associated with RPD/PD & O2 Sat Independence with exercise equipment Improved SOB with ADL's Using PLB without cueing & demonstrates good  technique Exercise tolerated well Personal goals reviewed Strength training completed today  Goals Unmet:  Not Applicable  Comments: Patient completed exercise prescription and all exercise goals during rehab sessions.The exercise was tolerated well, and the patient is progressing in the program.    Dr. Emily Filbert is Medical Director for [place] and [facility].

## 2015-06-25 ENCOUNTER — Ambulatory Visit: Payer: PPO | Admitting: Family Medicine

## 2015-06-25 ENCOUNTER — Encounter: Payer: PPO | Admitting: *Deleted

## 2015-06-25 ENCOUNTER — Other Ambulatory Visit: Payer: PPO

## 2015-06-25 DIAGNOSIS — J449 Chronic obstructive pulmonary disease, unspecified: Secondary | ICD-10-CM

## 2015-06-25 NOTE — Progress Notes (Signed)
Daily Session Note  Patient Details  Name: [name] MRN: [id number] Date of Birth: 09/21/1933 Referring Provider:    Encounter Date: 06/25/2015  Check In:     Session Check In - 06/25/15 1131    Check-In   Staff Present Heath Lark, RN, BSN, CCRP;Carroll Enterkin, RN, BSN;Laureen Owens Shark, BS, RRT, Respiratory Therapist   Supervising physician immediately available to respond to emergencies [facility] immediately available ER MD   Physician(s) Drs: Lovena Le and Jimmye Norman   Medication changes reported     No   Fall or balance concerns reported    No   Warm-up and Cool-down Performed on first and last piece of equipment   VAD Patient? No   Pain Assessment   Currently in Pain? No/denies         Goals Met:  Proper associated with RPD/PD & O2 Sat Independence with exercise equipment Exercise tolerated well Strength training completed today  Goals Unmet:  Not Applicable  Comments: Doing well with exercise prescription progression.    Dr. Emily Filbert is Medical Director for [place] and [facility].

## 2015-06-27 ENCOUNTER — Encounter: Payer: PPO | Admitting: *Deleted

## 2015-06-27 DIAGNOSIS — J449 Chronic obstructive pulmonary disease, unspecified: Secondary | ICD-10-CM

## 2015-06-27 NOTE — Progress Notes (Signed)
Daily Session Note  Patient Details  Name: KARLEIGH BUNTE MRN: [id number] Date of Birth: 24-Jun-1933 Referring Provider:    Encounter Date: 06/27/2015  Check In:     Session Check In - 06/27/15 1102    Check-In   Staff Present Heath Lark, RN, BSN, CCRP;Carroll Enterkin, RN, Moises Blood, BS, ACSM CEP, Exercise Physiologist   Supervising physician immediately available to respond to emergencies [facility] immediately available ER MD   Physician(s) Drs; Marcelene Butte and [facility]   Medication changes reported     No   Fall or balance concerns reported    No   Warm-up and Cool-down Performed on first and last piece of equipment   VAD Patient? No   Pain Assessment   Currently in Pain? No/denies         Goals Met:  Proper associated with RPD/PD & O2 Sat Exercise tolerated well Strength training completed today  Goals Unmet:  Not Applicable  Comments:Doing well with exercise prescription progression.    Dr. Emily Filbert is Medical Director for [place] and [facility].

## 2015-06-30 DIAGNOSIS — J449 Chronic obstructive pulmonary disease, unspecified: Secondary | ICD-10-CM

## 2015-06-30 NOTE — Addendum Note (Signed)
Addended by: Earlean Shawl T on: 06/30/2015 01:53 PM   Modules accepted: Orders

## 2015-06-30 NOTE — Progress Notes (Signed)
Pulmonary Individual Treatment Plan  Patient Details  Name: [name] MRN: [id number] Date of Birth: 07-30-1933 Referring Provider:    Initial Encounter Date: Vilinda Boehringer, MD       Pulmonary Rehab from 05/06/2015 in [facility] Cardiac and Pulmonary Rehab   Date  05/06/15      Visit Diagnosis: COPD, moderate ([place])  Patient's Home Medications on Admission:  Current outpatient prescriptions:  .  ADVAIR DISKUS 250-50 MCG/DOSE AEPB, INHALE 1 PUFF TWICE DAILY.  RINSE MOUTH AFTER EACH USE, Disp: 60 each, Rfl: 3 .  aspirin 81 MG tablet, Take 81 mg by mouth daily., Disp: , Rfl:  .  atorvastatin (LIPITOR) 20 MG tablet, , Disp: , Rfl:  .  Calcium Carb-Cholecalciferol (CALCIUM 600 + D PO), Take 1 tablet by mouth daily., Disp: , Rfl:  .  cholecalciferol (VITAMIN D) 1000 UNITS tablet, Take 1,000 Units by mouth daily., Disp: , Rfl:  .  levalbuterol (XOPENEX HFA) 45 MCG/ACT inhaler, Inhale into the lungs every 4 (four) hours as needed for wheezing., Disp: , Rfl:  .  LORazepam (ATIVAN) 1 MG tablet, Take 0.5 mg by mouth at bedtime. , Disp: , Rfl:  .  mirtazapine (REMERON) 30 MG tablet, Take 1 tablet by mouth daily., Disp: , Rfl:  .  Multiple Vitamin (MULTIVITAMIN) tablet, Take 1 tablet by mouth daily., Disp: , Rfl:  .  omeprazole (PRILOSEC) 40 MG capsule, Take 1 capsule by mouth daily., Disp: , Rfl:  .  OXYGEN, Inhale into the lungs at bedtime., Disp: , Rfl:  .  rosuvastatin (CRESTOR) 20 MG tablet, Take 20 mg by mouth daily., Disp: , Rfl:  .  SPIRIVA HANDIHALER 18 MCG inhalation capsule, Place 18 mcg into inhaler and inhale daily. , Disp: , Rfl:   Past Medical History: Past Medical History  Diagnosis Date  . Hyperlipidemia   . Lung disease   . GERD (gastroesophageal reflux disease)   . Shortness of breath   . Ovarian cyst 07/30/2014  . Cancer ([place])     lung, chemo    Tobacco Use: History  Smoking status  . Former Smoker -- 1.50 packs/day for 40 years  . Types: Cigarettes  . Quit date:  10/01/2001  Smokeless tobacco  . Former Geophysical data processor: Recent Review Flowsheet Data    There is no flowsheet data to display.       ADL [facility]:     Pulmonary Assessment Scores      05/06/15 1330 06/20/15 1110 06/23/15 1558   ADL UCSD   ADL Phase Entry Mid Mid   SOB Score total 44 58 --   Rest 0 0 --   Walk 0 1 --   Stairs 4 5 --   Bath 1 2 --   Dress 1 2 --   Shop 2 2 --      Pulmonary Function Assessment:     Pulmonary Function Assessment - 05/06/15 1330    Pulmonary Function Tests   RV% 96 %   DLCO% 29 %   Initial Spirometry Results   FVC% 61 %   FEV1% 32 %   FEV1/FVC Ratio 40   Post Bronchodilator Spirometry Results   FVC% 65 %   FEV1% 34 %   FEV1/FVC Ratio 39   Comments Test date 12/25/2014   Breath   Bilateral Breath Sounds Decreased;Clear  Lt upper lobectomy   Shortness of Breath Yes;Limiting activity      Exercise Target Goals:    Exercise Program Goal:  Individual exercise prescription set with THRR, safety & activity barriers. Participant demonstrates ability to understand and report RPE using BORG scale, to self-measure pulse accurately, and to acknowledge the importance of the exercise prescription.  Exercise Prescription Goal: Starting with aerobic activity 30 plus minutes a day, 3 days per week for initial exercise prescription. Provide home exercise prescription and guidelines that participant acknowledges understanding prior to discharge.  Activity Barriers & Risk Stratification:     Activity Barriers & Cardiac Risk Stratification - 05/06/15 1330    Activity Barriers & Cardiac Risk Stratification   Activity Barriers Shortness of Breath;Deconditioning;Muscular Weakness      6 Minute Walk:     6 Minute Walk      05/06/15 1645 06/20/15 1108     6 Minute Walk   Phase Initial Mid Program    Distance 1125 feet 1050 feet    Walk Time 6 minutes 6 minutes    # of Rest Breaks  0    RPE 13 13    Perceived Dyspnea  4 3    Symptoms No  Yes (comment)    Comments  SOB; low O2 sats    Resting HR 74 bpm 85 bpm    Resting BP 118/70 mmHg 120/62 mmHg    Max Ex. HR 96 bpm 86 bpm    Max Ex. BP 128/76 mmHg 142/68 mmHg       Initial Exercise Prescription:     Initial Exercise Prescription - 05/06/15 1600    Date of Initial Exercise RX and Referring Provider   Date 05/06/15   Treadmill   MPH 1.5   Grade 0   Minutes 10   Recumbant Bike   Level 2   RPM 40   Watts 20   Minutes 10   NuStep   Level 2   Watts 40   Minutes 10   Arm Ergometer   Level 1   Watts 10   Minutes 10   Biostep-RELP   Level 2   Watts 40   Minutes 10   Prescription Details   Frequency (times per week) 3   Duration Progress to 30 minutes of continuous aerobic without signs/symptoms of physical distress   Intensity   THRR REST +  30   Ratings of Perceived Exertion 11-15   Perceived Dyspnea 0-4   Progression   Progression Continue progressive overload as per policy without signs/symptoms or physical distress.   Resistance Training   Training Prescription Yes   Weight 1   Reps 10-15      Perform Capillary Blood Glucose checks as needed.  Exercise Prescription Changes:     Exercise Prescription Changes      05/12/15 1000 05/16/15 1100 05/19/15 1100 05/21/15 1100 05/23/15 1100   Exercise Review   Progression  Yes Yes Yes Yes   Response to Exercise   Blood Pressure (Admit) 104/66 mmHg       Blood Pressure (Exercise) 124/64 mmHg       Blood Pressure (Exit) 106/62 mmHg       Heart Rate (Admit) 81 bpm       Heart Rate (Exercise) 90 bpm       Heart Rate (Exit) 71 bpm       Oxygen Saturation (Admit) 94 %       Oxygen Saturation (Exercise) 91 %       Oxygen Saturation (Exit) 94 %       Rating of Perceived Exertion (Exercise) 15  Perceived Dyspnea (Exercise) 4       Symptoms None None None None None   Comments First day of exercise! Patient was oriented to the gym and the equipment functions and settings. Procedures and policies  of the gym were outlined and explained. The patient's individual exercise prescription and treatment plan were reviewed with them. All starting workloads were established based on the results of the functional testing  done at the initial intake visit. The plan for exercise progression was also introduced and progression will be customized based on the patient's performance and goals.  Reviewed individualized exercise prescription and made increases per departmental policy. Exercise increases were discussed with the patient and they were able to perform the new work loads without issue (no signs or symptoms).  Reviewed individualized exercise prescription and made increases per departmental policy. Exercise increases were discussed with the patient and they were able to perform the new work loads without issue (no signs or symptoms).  Reviewed individualized exercise prescription and made increases per departmental policy. Exercise increases were discussed with the patient and they were able to perform the new work loads without issue (no signs or symptoms).  Reviewed individualized exercise prescription and made increases per departmental policy. Exercise increases were discussed with the patient and they were able to perform the new work loads without issue (no signs or symptoms).    Duration Progress to 30 minutes of continuous aerobic without signs/symptoms of physical distress Progress to 30 minutes of continuous aerobic without signs/symptoms of physical distress Progress to 30 minutes of continuous aerobic without signs/symptoms of physical distress Progress to 30 minutes of continuous aerobic without signs/symptoms of physical distress Progress to 30 minutes of continuous aerobic without signs/symptoms of physical distress   Intensity Rest + 30 Rest + 30 Rest + 30 Rest + 30 Rest + 30   Progression   Progression Continue progressive overload as per policy without signs/symptoms or physical distress.  Continue progressive overload as per policy without signs/symptoms or physical distress. Continue progressive overload as per policy without signs/symptoms or physical distress. Continue progressive overload as per policy without signs/symptoms or physical distress. Continue progressive overload as per policy without signs/symptoms or physical distress.   Resistance Training   Training Prescription Yes Yes Yes Yes Yes   Weight '1 1 1 1 1   ' Reps 10-15 10-15 10-15 10-15 10-15   Treadmill   MPH 1.5 1.2 1.2 1.2 1.2   Grade 0 0 0 0 0   Minutes 10 15  3, 5 min intervals 12  continuous walking with no breaks 15  10mn with break then 5 minutes 15  17m with break then 5 minutes   Recumbant Bike   Level -- '1 1 1 1   ' RPM -- '20 20 20 20   ' Watts --       Minutes -- '10 10 10 10   ' NuStep   Level '2 2 2 2 2   ' Watts '20 20 20 20 15   ' Minutes '10 10 10 10 10   ' Arm Ergometer   Level --       Watts --       Minutes --       Biostep-RELP   Level '2 2 2 2 2   ' Watts '10 10 10 10 10   ' Minutes '10 10 10 10 10     ' 05/30/15 1200 06/06/15 1100 06/11/15 1100 06/23/15 1500     Exercise Review   Progression  Yes    Response to Exercise   Blood Pressure (Admit) 108/56 mmHg 108/56 mmHg  118/60 mmHg    Blood Pressure (Exercise) 122/82 mmHg 122/82 mmHg  126/74 mmHg    Blood Pressure (Exit) 120/70 mmHg 120/70 mmHg  122/72 mmHg    Heart Rate (Admit) 85 bpm 85 bpm  70 bpm    Heart Rate (Exercise) 85 bpm 85 bpm  90 bpm    Heart Rate (Exit) 70 bpm 70 bpm  76 bpm    Oxygen Saturation (Admit) 94 %  Room air 94 %  Room air --  Room air 95 %  Room air    Oxygen Saturation (Exercise) 91 %  room air 91 %  room air --  room air 90 %  room air    Oxygen Saturation (Exit) 97 %  room air 97 %  room air --  room air 94 %  room air    Rating of Perceived Exertion (Exercise)    11    Perceived Dyspnea (Exercise)    3    Symptoms    None    Duration Progress to 30 minutes of continuous aerobic without  signs/symptoms of physical distress Progress to 30 minutes of continuous aerobic without signs/symptoms of physical distress Progress to 30 minutes of continuous aerobic without signs/symptoms of physical distress Progress to 50 minutes of aerobic without signs/symptoms of physical distress    Intensity Rest + 30 Rest + 30 Rest + 30 Rest + 30    Progression   Progression Continue to progress workloads to maintain intensity without signs/symptoms of physical distress. Continue to progress workloads to maintain intensity without signs/symptoms of physical distress. Continue to progress workloads to maintain intensity without signs/symptoms of physical distress. Continue to progress workloads to maintain intensity without signs/symptoms of physical distress.    Resistance Training   Training Prescription Yes Yes Yes Yes    Weight '1 1 1 1    ' Reps 10-15 10-15 10-15 10-15    Treadmill   MPH 1.2 1.3 1.3 1.3    Grade 0 0 0 0    Minutes '12 15 15 20    ' Recumbant Bike   Level '1 1 1     ' RPM '15 15 15     ' Minutes '12 12 12     ' NuStep   Level '2 3 3 3    ' Watts 20 20 40 40    Minutes '15 15 15 15    ' Biostep-RELP   Level   2 2    Watts   10 15    Minutes   10 15       Exercise Comments:     Exercise Comments      05/14/15 1000 05/19/15 1156 06/20/15 1133       Exercise Comments Ms Reaves loves to do yardwork. She picks up sticks, trims shrubs, but is very careful to pace herself and use her PLB. Reviewed individualized exercise prescription and made increases per departmental policy. Exercise increases were discussed with the patient and they were able to perform the new work loads without issue (no signs or symptoms).  Able to work in garden longer yesterday. Attributes this to her  Pulmoanry Rehab exercise routine helping with improved stamina.        Discharge Exercise Prescription (Final Exercise Prescription Changes):     Exercise Prescription Changes - 06/23/15 1500    Exercise Review    Progression Yes   Response to Exercise  Blood Pressure (Admit) 118/60 mmHg   Blood Pressure (Exercise) 126/74 mmHg   Blood Pressure (Exit) 122/72 mmHg   Heart Rate (Admit) 70 bpm   Heart Rate (Exercise) 90 bpm   Heart Rate (Exit) 76 bpm   Oxygen Saturation (Admit) 95 %  Room air   Oxygen Saturation (Exercise) 90 %  room air   Oxygen Saturation (Exit) 94 %  room air   Rating of Perceived Exertion (Exercise) 11   Perceived Dyspnea (Exercise) 3   Symptoms None   Duration Progress to 50 minutes of aerobic without signs/symptoms of physical distress   Intensity Rest + 30   Progression   Progression Continue to progress workloads to maintain intensity without signs/symptoms of physical distress.   Resistance Training   Training Prescription Yes   Weight 1   Reps 10-15   Treadmill   MPH 1.3   Grade 0   Minutes 20   NuStep   Level 3   Watts 40   Minutes 15   Biostep-RELP   Level 2   Watts 15   Minutes 15       Nutrition:  Target Goals: Understanding of nutrition guidelines, daily intake of sodium <1552m, cholesterol <2032m calories 30% from fat and 7% or less from saturated fats, daily to have 5 or more servings of fruits and vegetables.  Biometrics:     Pre Biometrics - 05/06/15 1649    Pre Biometrics   Height 5' 2.5" (1.588 m)   Weight 108 lb (48.988 kg)   Waist Circumference 31 inches   Hip Circumference 31.5 inches   Waist to Hip Ratio 0.98 %   BMI (Calculated) 19.5       Nutrition Therapy Plan and Nutrition Goals:     Nutrition Therapy & Goals - 05/06/15 1330    Nutrition Therapy   Diet Ms NeCaffeerefers not to meet with the dietitian. She cooks healthy and eats small portion sizes.      Nutrition Discharge: Rate Your Plate Scores:   Psychosocial: Target Goals: Acknowledge presence or absence of depression, maximize coping skills, provide positive support system. Participant is able to verbalize types and ability to use techniques and skills  needed for reducing stress and depression.  Initial Review & Psychosocial Screening:     Initial Psych Review & Screening - 05/06/15 1330    Family Dynamics   Good Support System? Yes   Comments Ms NeLembergeras support from her daughter. Ms NeHamoread an article on depression and told her doctor she had the article's symptoms, mainly not wanting to go out or be involved with anyone. He put her on lexapro which helped.   Barriers   Psychosocial barriers to participate in program The patient should benefit from training in stress management and relaxation.   Screening Interventions   Interventions Program counselor consult;Encouraged to exercise      Quality of Life Scores:     Quality of Life - 05/06/15 1330    Quality of Life Scores   Health/Function Pre 15.07 %   Socioeconomic Pre 26 %   Psych/Spiritual Pre 19.43 %   Family Pre 19.5 %   GLOBAL Pre 18.63 %      PHQ-9:     Recent Review Flowsheet Data    [facility]   Decreased Interest 1   Down, Depressed, Hopeless 0   PHQ - 2 Score 1   Altered sleeping 0   Tired, decreased energy 1   Change  in appetite 1   Feeling bad or failure about yourself  0   Trouble concentrating 0   Moving slowly or fidgety/restless 0   Suicidal thoughts 0   PHQ-9 Score 3   Difficult doing work/chores Somewhat difficult      Psychosocial Evaluation and Intervention:     Psychosocial Evaluation - 05/12/15 1125    Psychosocial Evaluation & Interventions   Interventions Encouraged to exercise with the program and follow exercise prescription   Comments Counselor met with Ms. Meinzer for initial psychosocial evaluation today.  She is a 82 year old cancer survivor who had her left lung removed in 2006 due to lung cancer.  She lives alone but has a strong support system of a daughter who lives closeby; friends she plays [employer] with weekly and active involvement in her local church.  She states she sleeps well with the  medication the Dr. prescribed for this issue and she admits to a history of depression and anxiety, but the Lexapro prescribed several months ago appears to be helping - no current symptoms.  Ms. Eilers states she is typically in a positive mood and has minimal stress in her life.  She has goals to breathe easier without fear.  She plans to join a follow up program at [facility] or attend her local [employer] silver sneakers class upon completion of this program.         Psychosocial Re-Evaluation:     Psychosocial Re-Evaluation      06/18/15 1110 06/20/15 1134 06/25/15 1029       Psychosocial Re-Evaluation   Comments Counselor follow up with Ms. Nocera today reporting she continues to have up and down days with the pollens.  She states her mood is continuing to be mostly positive and she thinks she may be breathing somewhat better since coming into this program.   She also reports that after a workout in the program, she is "worn out," so doesn't feel that she has experienced increased energy as a result to date.  Her appetite and sleep remain good at this time.  Counselor will continue to follow, and encouraged Ms. Newbold to report her lack of energy to her doctor.  Evony stated that she was able to work in garden longer yesterday. Attributes this to her  Pulmoanry Rehab exercise routine helping with improved stamina. She enjoys working in the garden and was happy that she was able to do this yesterday Counselor follow up with Ms. Posa stating "May has historically been a bad month" for her.  She states she just lost two of her friends due to pulmonary problems and this has impacted her mood.  Counselor assessed her sleeping and appetite as being typical.  counselor assessed her support system through this sadness with Ms. Ureta reporting her sister has been with her and will accompany her to one of the funerals of a lifelong/childhood friend.  Counselor encouraged Ms. Minter to continue to take good care of  herself and commended her for being here working out today - to make this May a better month.   Counselor will continue to follow with Ms. Qualls.          Education: Education Goals: Education classes will be provided on a weekly basis, covering required topics. Participant will state understanding/return demonstration of topics presented.  Learning Barriers/Preferences:     Learning Barriers/Preferences - 05/06/15 1330    Learning Barriers/Preferences   Learning Barriers None   Learning Preferences Group Instruction;Individual Instruction;Pictoral;Skilled Demonstration;Verbal  Instruction;Video;Written Material      Education Topics: Initial Evaluation Education: - Verbal, written and demonstration of respiratory meds, RPE/PD scales, oximetry and breathing techniques. Instruction on use of nebulizers and MDIs: cleaning and proper use, rinsing mouth with steroid doses and importance of monitoring MDI activations.          Pulmonary Rehab from 06/25/2015 in [facility] Cardiac and Pulmonary Rehab   Date  05/06/15   Educator  LB   Instruction Review Code  2- meets goals/outcomes      General Nutrition Guidelines/Fats and Fiber: -Group instruction provided by verbal, written material, models and posters to present the general guidelines for heart healthy nutrition. Gives an explanation and review of dietary fats and fiber.      Pulmonary Rehab from 06/25/2015 in [facility] Cardiac and Pulmonary Rehab   Date  05/19/15   Educator  CR   Instruction Review Code  2- meets goals/outcomes      Controlling Sodium/Reading Food Labels: -Group verbal and written material supporting the discussion of sodium use in heart healthy nutrition. Review and explanation with models, verbal and written materials for utilization of the food label.   Exercise Physiology & Risk Factors: - Group verbal and written instruction with models to review the exercise physiology of the cardiovascular system and associated  critical values. Details cardiovascular disease risk factors and the goals associated with each risk factor.      Pulmonary Rehab from 06/25/2015 in [facility] Cardiac and Pulmonary Rehab   Date  05/14/15   Educator  SW   Instruction Review Code  2- meets goals/outcomes      Aerobic Exercise & Resistance Training: - Gives group verbal and written discussion on the health impact of inactivity. On the components of aerobic and resistive training programs and the benefits of this training and how to safely progress through these programs.   Flexibility, Balance, General Exercise Guidelines: - Provides group verbal and written instruction on the benefits of flexibility and balance training programs. Provides general exercise guidelines with specific guidelines to those with heart or lung disease. Demonstration and skill practice provided.      Pulmonary Rehab from 06/25/2015 in [facility] Cardiac and Pulmonary Rehab   Date  06/25/15   Educator  BS   Instruction Review Code  2- meets goals/outcomes      Stress Management: - Provides group verbal and written instruction about the health risks of elevated stress, cause of high stress, and healthy ways to reduce stress.   Depression: - Provides group verbal and written instruction on the correlation between heart/lung disease and depressed mood, treatment options, and the stigmas associated with seeking treatment.      Pulmonary Rehab from 06/25/2015 in [facility] Cardiac and Pulmonary Rehab   Date  05/21/15   Educator  Arlyss Queen   Instruction Review Code  2- meets goals/outcomes      Exercise & Equipment Safety: - Individual verbal instruction and demonstration of equipment use and safety with use of the equipment.      Pulmonary Rehab from 06/25/2015 in [facility] Cardiac and Pulmonary Rehab   Date  05/12/15   Educator  LB   Instruction Review Code  2- meets goals/outcomes      Infection Prevention: - Provides verbal and written material to individual  with discussion of infection control including proper hand washing and proper equipment cleaning during exercise session.      Pulmonary Rehab from 06/25/2015 in [facility] Cardiac and Pulmonary Rehab   Date  05/12/15   Educator  LB   Instruction Review Code  2- meets goals/outcomes      Falls Prevention: - Provides verbal and written material to individual with discussion of falls prevention and safety.      Pulmonary Rehab from 06/25/2015 in [facility] Cardiac and Pulmonary Rehab   Date  05/06/15   Educator  LB   Instruction Review Code  2- meets goals/outcomes      Diabetes: - Individual verbal and written instruction to review signs/symptoms of diabetes, desired ranges of glucose level fasting, after meals and with exercise. Advice that pre and post exercise glucose checks will be done for 3 sessions at entry of program.   Chronic Lung Diseases: - Group verbal and written instruction to review new updates, new respiratory medications, new advancements in procedures and treatments. Provide informative websites and "800" numbers of self-education.      Pulmonary Rehab from 06/25/2015 in [facility] Cardiac and Pulmonary Rehab   Date  06/23/15   Educator  LB   Instruction Review Code  2- meets goals/outcomes      Lung Procedures: - Group verbal and written instruction to describe testing methods done to diagnose lung disease. Review the outcome of test results. Describe the treatment choices: Pulmonary Function Tests, ABGs and oximetry.   Energy Conservation: - Provide group verbal and written instruction for methods to conserve energy, plan and organize activities. Instruct on pacing techniques, use of adaptive equipment and posture/positioning to relieve shortness of breath.   Triggers: - Group verbal and written instruction to review types of environmental controls: home humidity, furnaces, filters, dust mite/pet prevention, HEPA vacuums. To discuss weather changes, air quality and the benefits  of nasal washing.      Pulmonary Rehab from 06/25/2015 in [facility] Cardiac and Pulmonary Rehab   Date  06/09/15   Educator  LB   Instruction Review Code  2- meets goals/outcomes      Exacerbations: - Group verbal and written instruction to provide: warning signs, infection symptoms, calling MD promptly, preventive modes, and value of vaccinations. Review: effective airway clearance, coughing and/or vibration techniques. Create an Sports administrator.   Oxygen: - Individual and group verbal and written instruction on oxygen therapy. Includes supplement oxygen, available portable oxygen systems, continuous and intermittent flow rates, oxygen safety, concentrators, and Medicare reimbursement for oxygen.   Respiratory Medications: - Group verbal and written instruction to review medications for lung disease. Drug class, frequency, complications, importance of spacers, rinsing mouth after steroid MDI's, and proper cleaning methods for nebulizers.      Pulmonary Rehab from 06/25/2015 in [facility]   Date  05/06/15   Educator  LB   Instruction Review Code  2- meets goals/outcomes      AED/CPR: - Group verbal and written instruction with the use of models to demonstrate the basic use of the AED with the basic ABC's of resuscitation.   Breathing Retraining: - Provides individuals verbal and written instruction on purpose, frequency, and proper technique of diaphragmatic breathing and pursed-lipped breathing. Applies individual practice skills.      Pulmonary Rehab from 06/25/2015 in [facility]   Date  05/06/15   Educator  LB   Instruction Review Code  2- meets goals/outcomes      Anatomy and Physiology of the Lungs: - Group verbal and written instruction with the use of models to provide basic lung anatomy and physiology related to function, structure and complications of lung disease.  Heart Failure: - Group verbal and written instruction on the basics  of heart failure: signs/symptoms, treatments, explanation of ejection fraction, enlarged heart and cardiomyopathy.      Pulmonary Rehab from 06/25/2015 in [facility] and [facility] Re[facility]   Date  06/13/15   Educator  Mar-Mac   Instruction Review Code  2- meets goals/outcomes      Sleep Apnea: - Individual verbal and written instruction to review Obstructive Sleep Apnea. Review of risk factors, methods for diagnosing and types of masks and machines for OSA.   Anxiety: - Provides group, verbal and written instruction on the correlation between heart/lung disease and anxiety, treatment options, and management of anxiety.   Relaxation: - Provides group, verbal and written instruction about the benefits of relaxation for patients with heart/lung disease. Also provides patients with examples of relaxation techniques.   Knowledge Questionnaire Score:     Knowledge Questionnaire Score - 05/06/15 1330    Knowledge Questionnaire Score   Pre Score 9/10       Core Components/Risk Factors/Patient Goals at Admission:     Personal Goals and Risk Factors at Admission - 05/06/15 1330    Core Components/Risk Factors/Patient Goals on Admission   Sedentary Yes  Ms Deroo likes to work in her yard and use to [employer].   Intervention Provide advice, education, support and counseling about physical activity/exercise needs.;Develop an individualized exercise prescription for aerobic and resistive training based on initial evaluation findings, risk stratification, comorbidities and participant's personal goals.   Expected Outcomes Achievement of increased cardiorespiratory fitness and enhanced flexibility, muscular endurance and strength shown through measurements of functional capaciy and personal statement of participant.   Improve shortness of breath with ADL's Yes   Intervention Provide education, individualized exercise plan and daily activity instruction to help decrease symptoms of SOB with  activities of daily living.   Expected Outcomes Short Term: Achieves a reduction of symptoms when performing activities of daily living.   Develop more efficient breathing techniques such as purse lipped breathing and diaphragmatic breathing; and practicing self-pacing with activity Yes   Intervention Provide education, demonstration and support about specific breathing techniuqes utilized for more efficient breathing. Include techniques such as pursed lipped breathing, diaphragmatic breathing and self-pacing activity.   Expected Outcomes Short Term: Participant will be able to demonstrate and use breathing techniques as needed throughout daily activities.   Increase knowledge of respiratory medications and ability to use respiratory devices properly  Yes  Ms Bambach is using Advair, Spiriva, and Ventolin. She only uses her Ventolin when out and does not want to carry a spacer in her purse.  She wears 2l/m [place] at night only and uses Inogen.   Intervention Provide education and demonstration as needed of appropriate use of medications, inhalers, and oxygen therapy.   Expected Outcomes Short Term: Achieves understanding of medications use. Understands that oxygen is a medication prescribed by physician. Demonstrates appropriate use of inhaler and oxygen therapy.      Core Components/Risk Factors/Patient Goals Review:      Goals and Risk Factor Review      05/12/15 1600 05/14/15 1000 05/23/15 1248 05/23/15 1252 06/23/15 1817   Core Components/Risk Factors/Patient Goals Review   Personal Goals Review Develop more efficient breathing techniques such as purse lipped breathing and diaphragmatic breathing and practicing self-pacing with activity. Develop more efficient breathing techniques such as purse lipped breathing and diaphragmatic breathing and practicing self-pacing with activity. Improve shortness of breath with ADL's Increase knowledge of respiratory medications and  ability to use respiratory  devices properly. Sedentary;Increase Strength and Stamina;Improve shortness of breath with ADL's;Develop more efficient breathing techniques such as purse lipped breathing and diaphragmatic breathing and practicing self-pacing with activity.;Increase knowledge of respiratory medications and ability to use respiratory devices properly.   Review Ms Fontanella used PLB with her exercise goals and used good technique. Ms Callies states she uses PLB with activity at home, especially her yardwork.  Ms. Balash says that she has a scratchy throat today.  We discussed the importance of an antihistimine during the spring months and the signs and symptoms of infection. Ms. Simpson takes Ventolin as a rescue inhaler.  Spacer was given to her today. Lexey continues in [employer].  Israa states ever since the pollen has arrived she has not been breathing as well, which impacts her ability to exercise and do things she would like to do. Shenique states she has an antihistamine to take, but fdoes not like to take it, as she is taking so many meds already according to pt.   Amee would like to get outside and do her yard work.  Briahna explained she becomes anxious when she cannot get outside to do her yard work.  Kina reports for the first time she was able to sweep her entire kitchen without stopping to catch her breath.  Flonnie reports use of pursed lip breathing to help her get tasks done.  Reviewed correct use of Adviar and Spiriva with Jana Half.  Good technique described by patient.     Expected Outcomes Continue using PLB during exercise goals and with activites at home for the continuation of [facility] with out cueing. Continue using the PLB, especially with her activites outside. Ms. Cowens says that she will watch this closely and will start taking an antihistimine if the scratchy throat continues to get worse.  This medication should help with her drainage and scratchy throat. The spacer will help her inhale more of her  medication and get quicker relief from her rescue inhaler. Cecia will continue in [place] to increase her stamina and energy as well as to promote lung expansion to help maximize air exchange.  Reygan will continue using purse lip breathing technique to help minimize SOB.        Core Components/Risk Factors/Patient Goals at Discharge (Final Review):      Goals and Risk Factor Review - 06/23/15 1817    Core Components/Risk Factors/Patient Goals Review   Personal Goals Review Sedentary;Increase Strength and Stamina;Improve shortness of breath with ADL's;Develop more efficient breathing techniques such as purse lipped breathing and diaphragmatic breathing and practicing self-pacing with activity.;Increase knowledge of respiratory medications and ability to use respiratory devices properly.   Review Dynasia continues in [employer].  Dalynn states ever since the pollen has arrived she has not been breathing as well, which impacts her ability to exercise and do things she would like to do. Tyrea states she has an antihistamine to take, but fdoes not like to take it, as she is taking so many meds already according to pt.   Ashantae would like to get outside and do her yard work.  Corrisa explained she becomes anxious when she cannot get outside to do her yard work.  Temitope reports for the first time she was able to sweep her entire kitchen without stopping to catch her breath.  Addley reports use of pursed lip breathing to help her get tasks done.  Reviewed correct use of Adviar and Spiriva with Jana Half.  Good technique described by  patient.     Expected Outcomes Samiah will continue in [place] to increase her stamina and energy as well as to promote lung expansion to help maximize air exchange.  Clothilde will continue using purse lip breathing technique to help minimize SOB.        ITP Comments:   Comments: 30 Day Review

## 2015-06-30 NOTE — Progress Notes (Signed)
Daily Session Note  Patient Details  Name: [name] MRN: [id number] Date of Birth: 1933/10/26 Referring Provider:    Encounter Date: 06/30/2015  Check In:     Session Check In - 06/30/15 1128    Check-In   Location [facility] & Pulmonary Re[facility]   Staff Present Carson Myrtle, BS, RRT, Respiratory Therapist;Carroll Enterkin, RN, BSN;Jessica Luan Pulling, MA, ACSM RCEP, Exercise Physiologist;Kelly Amedeo Plenty, BS, ACSM CEP, Exercise Physiologist;Shyra Emile Oletta Darter, BA, ACSM CEP, Exercise Physiologist   Supervising physician immediately available to respond to emergencies [facility] immediately available ER MD   Physician(s) Reita Cliche and Printmaker   Medication changes reported     No   Fall or balance concerns reported    No   Warm-up and Cool-down Performed on first and last piece of equipment   Resistance Training Performed Yes   VAD Patient? No   Pain Assessment   Currently in Pain? No/denies   Multiple Pain Sites No         Goals Met:  Proper associated with RPD/PD & O2 Sat Independence with exercise equipment Exercise tolerated well Strength training completed today  Goals Unmet:  Not Applicable  Comments:    Dr. Emily Filbert is Medical Director for [place] and [facility].

## 2015-07-02 DIAGNOSIS — J449 Chronic obstructive pulmonary disease, unspecified: Secondary | ICD-10-CM

## 2015-07-02 NOTE — Progress Notes (Signed)
Daily Session Note  Patient Details  Name: [name] MRN: [id number] Date of Birth: 05-28-1933 Referring Provider:    Encounter Date: 07/02/2015  Check In:     Session Check In - 07/02/15 1247    Check-In   Location [facility]   Staff Present Carson Myrtle, BS, RRT, Respiratory Therapist;Carroll Enterkin, RN, BSN;Rebecca Sickles, DPT, Burlene Arnt, BA, ACSM CEP, Exercise Physiologist   Supervising physician immediately available to respond to emergencies [facility] immediately available ER MD   Physician(s) Drs Joni Fears and Clearnce Hasten   Medication changes reported     No   Fall or balance concerns reported    No   Warm-up and Cool-down Performed on first and last piece of equipment   Resistance Training Performed Yes   VAD Patient? No   Pain Assessment   Currently in Pain? No/denies         Goals Met:  Proper associated with RPD/PD & O2 Sat Independence with exercise equipment Exercise tolerated well Strength training completed today  Goals Unmet:  Not Applicable  Comments: [name] is progressing well with exercise   Dr. Emily Filbert is Medical Director for [place] and [facility].

## 2015-07-04 DIAGNOSIS — J449 Chronic obstructive pulmonary disease, unspecified: Secondary | ICD-10-CM | POA: Diagnosis not present

## 2015-07-07 ENCOUNTER — Encounter: Payer: PPO | Admitting: *Deleted

## 2015-07-07 VITALS — Ht 62.5 in | Wt 106.8 lb

## 2015-07-07 DIAGNOSIS — J449 Chronic obstructive pulmonary disease, unspecified: Secondary | ICD-10-CM | POA: Diagnosis not present

## 2015-07-07 NOTE — Progress Notes (Signed)
Daily Session Note  Patient Details  Name: PUNAM BROUSSARD MRN: [id number] Date of Birth: 24-Aug-1933 Referring Provider:    Encounter Date: 07/07/2015  Check In:     Session Check In - 07/07/15 1135    Check-In   Location [facility]   Staff Present Carson Myrtle, BS, RRT, Respiratory Therapist;Isaiyah Feldhaus, RN, BSN;Mary Kellie Shropshire, RN;Kelly Amedeo Plenty, BS, ACSM CEP, Exercise Physiologist   Supervising physician immediately available to respond to emergencies [facility] immediately available ER MD   Physician(s) Dr. Mariea Clonts and Dr. Reita Cliche   Medication changes reported     No   Fall or balance concerns reported    No   Warm-up and Cool-down Performed on first and last piece of equipment   Resistance Training Performed Yes   VAD Patient? No   Pain Assessment   Currently in Pain? No/denies         Goals Met:  Proper associated with RPD/PD & O2 Sat Exercise tolerated well  Goals Unmet:  Not Applicable  Comments:     Dr. Emily Filbert is Medical Director for [place] and [facility].

## 2015-07-08 NOTE — Progress Notes (Signed)
Discharge Summary  Patient Details  Name: [name] MRN: [id number] Date of Birth: 11-Apr-1933 Referring Provider:  Dr Vilinda Boehringer   Number of Visits: 24 Reason for Discharge:  Patient reached a stable level of exercise. Patient independent in their exercise. Early Exit:  Insurance - $20.00 copay  Smoking History:  History  Smoking status   Former Smoker -- 1.50 packs/day for 40 years   Types: Cigarettes   Quit date: 10/01/2001  Smokeless tobacco   Former User    Diagnosis:  COPD, moderate ([place])  ADL [facility]:     Pulmonary Assessment Scores      05/06/15 1330 06/20/15 1110 06/23/15 1558   ADL [facility]   ADL Phase Entry Mid Mid   SOB Score total 44 58 --   Rest 0 0 --   Walk 0 1 --   Stairs 4 5 --   Bath 1 2 --   Dress 1 2 --   Shop 2 2 --      Initial Exercise Prescription:     Initial Exercise Prescription - 05/06/15 1600    Date of Initial Exercise RX and Referring Provider   Date 05/06/15   Treadmill   MPH 1.5   Grade 0   Minutes 10   Recumbant Bike   Level 2   RPM 40   Watts 20   Minutes 10   NuStep   Level 2   Watts 40   Minutes 10   Arm Ergometer   Level 1   Watts 10   Minutes 10   Biostep-RELP   Level 2   Watts 40   Minutes 10   Prescription Details   Frequency (times per week) 3   Duration Progress to 30 minutes of continuous aerobic without signs/symptoms of physical distress   Intensity   THRR REST +  30   Ratings of Perceived Exertion 11-15   Perceived Dyspnea 0-4   Progression   Progression Continue progressive overload as per policy without signs/symptoms or physical distress.   Resistance Training   Training Prescription Yes   Weight 1   Reps 10-15      Discharge Exercise Prescription (Final Exercise Prescription Changes):     Exercise Prescription Changes - 07/08/15 0700    Exercise Review   Progression Yes   Response to Exercise   Blood Pressure (Admit) 110/70 mmHg   Blood Pressure (Exercise) 110/64  mmHg   Blood Pressure (Exit) 114/68 mmHg   Heart Rate (Admit) 89 bpm   Heart Rate (Exercise) 87 bpm   Heart Rate (Exit) 75 bpm   Oxygen Saturation (Admit) 93 %   Oxygen Saturation (Exercise) 92 %   Oxygen Saturation (Exit) 95 %   Rating of Perceived Exertion (Exercise) 12   Perceived Dyspnea (Exercise) 3   Symptoms None   Intensity Rest + 30   Progression   Progression Continue to progress workloads to maintain intensity without signs/symptoms of physical distress.   Resistance Training   Training Prescription Yes   Weight 1   Reps 10-15   Treadmill   MPH 1.3   Grade 0   Minutes 20   NuStep   Level 3   Watts 40   Minutes 15   Biostep-RELP   Level 2   Watts 15   Minutes 15   Home Exercise Plan   Plans to continue exercise at [employer]   Frequency Add 1 additional day to program exercise sessions.      Functional  Capacity:     6 Minute Walk      05/06/15 1645 06/20/15 1108 07/07/15 1429   6 Minute Walk   Phase Initial Mid Program Discharge   Distance 1125 feet 1050 feet 1135 feet   Walk Time 6 minutes 6 minutes 6 minutes   # of Rest Breaks  0    RPE '13 13 12   '$ Perceived Dyspnea  '4 3 3   '$ Symptoms No Yes (comment) No   Comments  SOB; low O2 sats    Resting HR 74 bpm 85 bpm 89 bpm   Resting BP 118/70 mmHg 120/62 mmHg 110/70 mmHg   Max Ex. HR 96 bpm 86 bpm 89 bpm   Max Ex. BP 128/76 mmHg 142/68 mmHg 110/64 mmHg      Psychological, QOL, Others - Outcomes: PHQ 2/9: Depression screen [facility] 2/9 07/08/2015 05/06/2015  Decreased Interest 0 1  Down, Depressed, Hopeless 0 0  PHQ - 2 Score 0 1  Altered sleeping 1 0  Tired, decreased energy 1 1  Change in appetite 1 1  Feeling bad or failure about yourself  0 0  Trouble concentrating 0 0  Moving slowly or fidgety/restless 0 0  Suicidal thoughts 0 0  PHQ-9 Score 3 3  Difficult doing work/chores Somewhat difficult Somewhat difficult    Quality of Life:     Quality of Life - 07/08/15 0740    Quality of Life  Scores   Health/Function Pre 15.07 %   Health/Function Post 17.97 %   Health/Function % Change 19.24 %   Socioeconomic Pre 26 %   Socioeconomic Post 25.25 %   Socioeconomic % Change  -2.88 %   Psych/Spiritual Pre 19.43 %   Psych/Spiritual Post 23.07 %   Psych/Spiritual % Change 18.73 %   Family Pre 19.5 %   Family Post 22.5 %   Family % Change 15.38 %   GLOBAL Pre 18.63 %   GLOBAL Post 21.02 %   GLOBAL % Change 12.83 %      Personal Goals: Goals established at orientation with interventions provided to work toward goal.     Personal Goals and Risk Factors at Admission - 05/06/15 1330    Core Components/Risk Factors/Patient Goals on Admission   Sedentary Yes  [name] likes to work in her yard and use to [employer].   Intervention Provide advice, education, support and counseling about physical activity/exercise needs.;Develop an individualized exercise prescription for aerobic and resistive training based on initial evaluation findings, risk stratification, comorbidities and participant's personal goals.   Expected Outcomes Achievement of increased cardiorespiratory fitness and enhanced flexibility, muscular endurance and strength shown through measurements of functional capaciy and personal statement of participant.   Improve shortness of breath with ADL's Yes   Intervention Provide education, individualized exercise plan and daily activity instruction to help decrease symptoms of SOB with activities of daily living.   Expected Outcomes Short Term: Achieves a reduction of symptoms when performing activities of daily living.   Develop more efficient breathing techniques such as purse lipped breathing and diaphragmatic breathing; and practicing self-pacing with activity Yes   Intervention Provide education, demonstration and support about specific breathing techniuqes utilized for more efficient breathing. Include techniques such as pursed lipped breathing, diaphragmatic breathing  and self-pacing activity.   Expected Outcomes Short Term: Participant will be able to demonstrate and use breathing techniques as needed throughout daily activities.   Increase knowledge of respiratory medications and ability to use respiratory devices properly  Yes  [name] is using Advair, Spiriva, and Ventolin. She only uses her Ventolin when out and does not want to carry a spacer in her purse.  She wears 2l/m [place] at night only and uses Inogen.   Intervention Provide education and demonstration as needed of appropriate use of medications, inhalers, and oxygen therapy.   Expected Outcomes Short Term: Achieves understanding of medications use. Understands that oxygen is a medication prescribed by physician. Demonstrates appropriate use of inhaler and oxygen therapy.       Personal Goals Discharge:     Goals and Risk Factor Review      05/12/15 1600 05/14/15 1000 05/23/15 1248 05/23/15 1252 06/23/15 1817   Core Components/Risk Factors/Patient Goals Review   Personal Goals Review Develop more efficient breathing techniques such as purse lipped breathing and diaphragmatic breathing and practicing self-pacing with activity. Develop more efficient breathing techniques such as purse lipped breathing and diaphragmatic breathing and practicing self-pacing with activity. Improve shortness of breath with ADL's Increase knowledge of respiratory medications and ability to use respiratory devices properly. Sedentary;Increase Strength and Stamina;Improve shortness of breath with ADL's;Develop more efficient breathing techniques such as purse lipped breathing and diaphragmatic breathing and practicing self-pacing with activity.;Increase knowledge of respiratory medications and ability to use respiratory devices properly.   Review Jody Hursey used PLB with her exercise goals and used good technique. Jody Kleinsasser states she uses PLB with activity at home, especially her yardwork.  Jody. Whisman says that she has a  scratchy throat today.  We discussed the importance of an antihistimine during the spring months and the signs and symptoms of infection. Jody. Redel takes Ventolin as a rescue inhaler.  Spacer was given to her today. Tarisha continues in [employer].  Iriel states ever since the pollen has arrived she has not been breathing as well, which impacts her ability to exercise and do things she would like to do. Shadiyah states she has an antihistamine to take, but fdoes not like to take it, as she is taking so many meds already according to pt.   Runette would like to get outside and do her yard work.  Kimberlye explained she becomes anxious when she cannot get outside to do her yard work.  Dorris reports for the first time she was able to sweep her entire kitchen without stopping to catch her breath.  Ezri reports use of pursed lip breathing to help her get tasks done.  Reviewed correct use of Adviar and Spiriva with Jana Half.  Good technique described by patient.     Expected Outcomes Continue using PLB during exercise goals and with activites at home for the continuation of [facility] with out cueing. Continue using the PLB, especially with her activites outside. Jody. Esteban says that she will watch this closely and will start taking an antihistimine if the scratchy throat continues to get worse.  This medication should help with her drainage and scratchy throat. The spacer will help her inhale more of her medication and get quicker relief from her rescue inhaler. Bina will continue in [place] to increase her stamina and energy as well as to promote lung expansion to help maximize air exchange.  Meckenzie will continue using purse lip breathing technique to help minimize SOB.       07/08/15 0750           Core Components/Risk Factors/Patient Goals Review   Personal Goals Review Increase Strength and Stamina;Improve shortness of breath with ADL's;Increase knowledge of respiratory medications and ability  to use  respiratory devices properly.;Develop more efficient breathing techniques such as purse lipped breathing and diaphragmatic breathing and practicing self-pacing with activity.       Review Jody Kitamura graduated today from [employer]. She has a $20.00 copay and could not continue in [place]. Her insurance does have [employer], so she will continue exercise in our [employer] program at least 2 days / week. She does a lot of yardwork which keeps her very active. She has learned to pace herself and use PLB. Also her inhalers help manage her COPD, so she is very compliant with their use. Her increase in stamina is represented by the great job she has done on the Treadmill - in the beginning of [facility],she was doing 3 sets of 5 minutes and now is performing 28mns on the treadmill. Jody NRoutsonrecognizes the importance of exercise and staying active in the management of her COPD.        [place] and continue managing her COPD with pacing , PLB, proper use of her inhalers, and management of exacerbations.          Nutrition & Weight - Outcomes:     Pre Biometrics - 05/06/15 1649    Pre Biometrics   Height 5' 2.5" (1.588 m)   Weight 108 lb (48.988 kg)   Waist Circumference 31 inches   Hip Circumference 31.5 inches   Waist to Hip Ratio 0.98 %   BMI (Calculated) 19.5         Post Biometrics - 07/07/15 1436     Post  Biometrics   Height 5' 2.5" (1.588 m)   Weight 106 lb 12.8 oz (48.444 kg)   Waist Circumference 28 inches   Hip Circumference 34.5 inches   Waist to Hip Ratio 0.81 %   BMI (Calculated) 19.3      Nutrition:     Nutrition Therapy & Goals - 05/06/15 1330    Nutrition Therapy   Diet Jody NThayneprefers not to meet with the dietitian. She cooks healthy and eats small portion sizes.      Nutrition Discharge:   Education Questionnaire Score:     Knowledge Questionnaire Score - 07/07/15 0734    Knowledge Questionnaire Score   Post  Score 9/10      Goals reviewed with patient; copy given to patient.

## 2015-07-08 NOTE — Progress Notes (Signed)
Pulmonary Individual Treatment Plan  Patient Details  Name: NYOMI HOWSER MRN: [id number] Date of Birth: 22-Mar-1933 Referring Provider:  Dr Vilinda Boehringer  Initial Encounter Date:       Pulmonary Rehab from 05/06/2015 in [facility] Cardiac and Pulmonary Rehab   Date  05/06/15      Visit Diagnosis: COPD, moderate ([place])  Patient's Home Medications on Admission:  Current outpatient prescriptions:    ADVAIR DISKUS 250-50 MCG/DOSE AEPB, INHALE 1 PUFF TWICE DAILY.  RINSE MOUTH AFTER EACH USE, Disp: 60 each, Rfl: 3   aspirin 81 MG tablet, Take 81 mg by mouth daily., Disp: , Rfl:    atorvastatin (LIPITOR) 20 MG tablet, , Disp: , Rfl:    Calcium Carb-Cholecalciferol (CALCIUM 600 + D PO), Take 1 tablet by mouth daily., Disp: , Rfl:    cholecalciferol (VITAMIN D) 1000 UNITS tablet, Take 1,000 Units by mouth daily., Disp: , Rfl:    levalbuterol (XOPENEX HFA) 45 MCG/ACT inhaler, Inhale into the lungs every 4 (four) hours as needed for wheezing., Disp: , Rfl:    LORazepam (ATIVAN) 1 MG tablet, Take 0.5 mg by mouth at bedtime. , Disp: , Rfl:    mirtazapine (REMERON) 30 MG tablet, Take 1 tablet by mouth daily., Disp: , Rfl:    Multiple Vitamin (MULTIVITAMIN) tablet, Take 1 tablet by mouth daily., Disp: , Rfl:    omeprazole (PRILOSEC) 40 MG capsule, Take 1 capsule by mouth daily., Disp: , Rfl:    OXYGEN, Inhale into the lungs at bedtime., Disp: , Rfl:    rosuvastatin (CRESTOR) 20 MG tablet, Take 20 mg by mouth daily., Disp: , Rfl:    SPIRIVA HANDIHALER 18 MCG inhalation capsule, Place 18 mcg into inhaler and inhale daily. , Disp: , Rfl:   Past Medical History: Past Medical History  Diagnosis Date   Hyperlipidemia    Lung disease    GERD (gastroesophageal reflux disease)    Shortness of breath    Ovarian cyst 07/30/2014   Cancer (HCC)     lung, chemo    Tobacco Use: History  Smoking status   Former Smoker -- 1.50 packs/day for 40 years   Types: Cigarettes   Quit date:  10/01/2001  Smokeless tobacco   Former Systems developer    Labs: Recent Review Flowsheet Data    There is no flowsheet data to display.       ADL UCSD:     Pulmonary Assessment Scores      05/06/15 1330 06/20/15 1110 06/23/15 1558   ADL UCSD   ADL Phase Entry Mid Mid   SOB Score total 44 58 --   Rest 0 0 --   Walk 0 1 --   Stairs 4 5 --   Bath 1 2 --   Dress 1 2 --   Shop 2 2 --      Pulmonary Function Assessment:     Pulmonary Function Assessment - 05/06/15 1330    Pulmonary Function Tests   RV% 96 %   DLCO% 29 %   Initial Spirometry Results   FVC% 61 %   FEV1% 32 %   FEV1/FVC Ratio 40   Post Bronchodilator Spirometry Results   FVC% 65 %   FEV1% 34 %   FEV1/FVC Ratio 39   Comments Test date 12/25/2014   Breath   Bilateral Breath Sounds Decreased;Clear  Lt upper lobectomy   Shortness of Breath Yes;Limiting activity      Exercise Target Goals:    Exercise Program Goal: Individual  exercise prescription set with THRR, safety & activity barriers. Participant demonstrates ability to understand and report RPE using BORG scale, to self-measure pulse accurately, and to acknowledge the importance of the exercise prescription.  Exercise Prescription Goal: Starting with aerobic activity 30 plus minutes a day, 3 days per week for initial exercise prescription. Provide home exercise prescription and guidelines that participant acknowledges understanding prior to discharge.  Activity Barriers & Risk Stratification:     Activity Barriers & Cardiac Risk Stratification - 05/06/15 1330    Activity Barriers & Cardiac Risk Stratification   Activity Barriers Shortness of Breath;Deconditioning;Muscular Weakness      6 Minute Walk:     6 Minute Walk      05/06/15 1645 06/20/15 1108 07/07/15 1429   6 Minute Walk   Phase Initial Mid Program Discharge   Distance 1125 feet 1050 feet 1135 feet   Walk Time 6 minutes 6 minutes 6 minutes   # of Rest Breaks  0    RPE '13 13 12    ' Perceived Dyspnea  '4 3 3   ' Symptoms No Yes (comment) No   Comments  SOB; low O2 sats    Resting HR 74 bpm 85 bpm 89 bpm   Resting BP 118/70 mmHg 120/62 mmHg 110/70 mmHg   Max Ex. HR 96 bpm 86 bpm 89 bpm   Max Ex. BP 128/76 mmHg 142/68 mmHg 110/64 mmHg      Initial Exercise Prescription:     Initial Exercise Prescription - 05/06/15 1600    Date of Initial Exercise RX and Referring Provider   Date 05/06/15   Treadmill   MPH 1.5   Grade 0   Minutes 10   Recumbant Bike   Level 2   RPM 40   Watts 20   Minutes 10   NuStep   Level 2   Watts 40   Minutes 10   Arm Ergometer   Level 1   Watts 10   Minutes 10   Biostep-RELP   Level 2   Watts 40   Minutes 10   Prescription Details   Frequency (times per week) 3   Duration Progress to 30 minutes of continuous aerobic without signs/symptoms of physical distress   Intensity   THRR REST +  30   Ratings of Perceived Exertion 11-15   Perceived Dyspnea 0-4   Progression   Progression Continue progressive overload as per policy without signs/symptoms or physical distress.   Resistance Training   Training Prescription Yes   Weight 1   Reps 10-15      Perform Capillary Blood Glucose checks as needed.  Exercise Prescription Changes:     Exercise Prescription Changes      05/12/15 1000 05/16/15 1100 05/19/15 1100 05/21/15 1100 05/23/15 1100   Exercise Review   Progression  Yes Yes Yes Yes   Response to Exercise   Blood Pressure (Admit) 104/66 mmHg       Blood Pressure (Exercise) 124/64 mmHg       Blood Pressure (Exit) 106/62 mmHg       Heart Rate (Admit) 81 bpm       Heart Rate (Exercise) 90 bpm       Heart Rate (Exit) 71 bpm       Oxygen Saturation (Admit) 94 %       Oxygen Saturation (Exercise) 91 %       Oxygen Saturation (Exit) 94 %       Rating of Perceived Exertion (Exercise) 15  Perceived Dyspnea (Exercise) 4       Symptoms None None None None None   Comments First day of exercise! Patient was oriented  to the gym and the equipment functions and settings. Procedures and policies of the gym were outlined and explained. The patient's individual exercise prescription and treatment plan were reviewed with them. All starting workloads were established based on the results of the functional testing  done at the initial intake visit. The plan for exercise progression was also introduced and progression will be customized based on the patient's performance and goals.  Reviewed individualized exercise prescription and made increases per departmental policy. Exercise increases were discussed with the patient and they were able to perform the new work loads without issue (no signs or symptoms).  Reviewed individualized exercise prescription and made increases per departmental policy. Exercise increases were discussed with the patient and they were able to perform the new work loads without issue (no signs or symptoms).  Reviewed individualized exercise prescription and made increases per departmental policy. Exercise increases were discussed with the patient and they were able to perform the new work loads without issue (no signs or symptoms).  Reviewed individualized exercise prescription and made increases per departmental policy. Exercise increases were discussed with the patient and they were able to perform the new work loads without issue (no signs or symptoms).    Duration Progress to 30 minutes of continuous aerobic without signs/symptoms of physical distress Progress to 30 minutes of continuous aerobic without signs/symptoms of physical distress Progress to 30 minutes of continuous aerobic without signs/symptoms of physical distress Progress to 30 minutes of continuous aerobic without signs/symptoms of physical distress Progress to 30 minutes of continuous aerobic without signs/symptoms of physical distress   Intensity Rest + 30 Rest + 30 Rest + 30 Rest + 30 Rest + 30   Progression   Progression Continue  progressive overload as per policy without signs/symptoms or physical distress. Continue progressive overload as per policy without signs/symptoms or physical distress. Continue progressive overload as per policy without signs/symptoms or physical distress. Continue progressive overload as per policy without signs/symptoms or physical distress. Continue progressive overload as per policy without signs/symptoms or physical distress.   Resistance Training   Training Prescription Yes Yes Yes Yes Yes   Weight '1 1 1 1 1   ' Reps 10-15 10-15 10-15 10-15 10-15   Treadmill   MPH 1.5 1.2 1.2 1.2 1.2   Grade 0 0 0 0 0   Minutes 10 15  3, 5 min intervals 12  continuous walking with no breaks 15  17mn with break then 5 minutes 15  129m with break then 5 minutes   Recumbant Bike   Level -- '1 1 1 1   ' RPM -- '20 20 20 20   ' Watts --       Minutes -- '10 10 10 10   ' NuStep   Level '2 2 2 2 2   ' Watts '20 20 20 20 15   ' Minutes '10 10 10 10 10   ' Arm Ergometer   Level --       Watts --       Minutes --       Biostep-RELP   Level '2 2 2 2 2   ' Watts '10 10 10 10 10   ' Minutes '10 10 10 10 10     ' 05/30/15 1200 06/06/15 1100 06/11/15 1100 06/23/15 1500 07/08/15 0700   Exercise Review   Progression  Yes Yes   Response to Exercise   Blood Pressure (Admit) 108/56 mmHg 108/56 mmHg  118/60 mmHg 110/70 mmHg   Blood Pressure (Exercise) 122/82 mmHg 122/82 mmHg  126/74 mmHg 110/64 mmHg   Blood Pressure (Exit) 120/70 mmHg 120/70 mmHg  122/72 mmHg 114/68 mmHg   Heart Rate (Admit) 85 bpm 85 bpm  70 bpm 89 bpm   Heart Rate (Exercise) 85 bpm 85 bpm  90 bpm 87 bpm   Heart Rate (Exit) 70 bpm 70 bpm  76 bpm 75 bpm   Oxygen Saturation (Admit) 94 %  Room air 94 %  Room air --  Room air 95 %  Room air 93 %   Oxygen Saturation (Exercise) 91 %  room air 91 %  room air --  room air 90 %  room air 92 %   Oxygen Saturation (Exit) 97 %  room air 97 %  room air --  room air 94 %  room air 95 %   Rating of Perceived  Exertion (Exercise)    11 12   Perceived Dyspnea (Exercise)    3 3   Symptoms    None None   Duration Progress to 30 minutes of continuous aerobic without signs/symptoms of physical distress Progress to 30 minutes of continuous aerobic without signs/symptoms of physical distress Progress to 30 minutes of continuous aerobic without signs/symptoms of physical distress Progress to 50 minutes of aerobic without signs/symptoms of physical distress    Intensity Rest + 30 Rest + 30 Rest + 30 Rest + 30 Rest + 30   Progression   Progression Continue to progress workloads to maintain intensity without signs/symptoms of physical distress. Continue to progress workloads to maintain intensity without signs/symptoms of physical distress. Continue to progress workloads to maintain intensity without signs/symptoms of physical distress. Continue to progress workloads to maintain intensity without signs/symptoms of physical distress. Continue to progress workloads to maintain intensity without signs/symptoms of physical distress.   Resistance Training   Training Prescription Yes Yes Yes Yes Yes   Weight '1 1 1 1 1   ' Reps 10-15 10-15 10-15 10-15 10-15   Treadmill   MPH 1.2 1.3 1.3 1.3 1.3   Grade 0 0 0 0 0   Minutes '12 15 15 20 20   ' Recumbant Bike   Level '1 1 1     ' RPM '15 15 15     ' Minutes '12 12 12     ' NuStep   Level '2 3 3 3 3   ' Watts 20 20 40 40 40   Minutes '15 15 15 15 15   ' Biostep-RELP   Level   '2 2 2   ' Watts   '10 15 15   ' Minutes   '10 15 15   ' Home Exercise Plan   Plans to continue exercise at     [employer]   Frequency     Add 1 additional day to program exercise sessions.      Exercise Comments:     Exercise Comments      05/14/15 1000 05/19/15 1156 06/20/15 1133 07/07/15 1437     Exercise Comments Ms Verville loves to do yardwork. She picks up sticks, trims shrubs, but is very careful to pace herself and use her PLB. Reviewed individualized exercise prescription and made increases per  departmental policy. Exercise increases were discussed with the patient and they were able to perform the new work loads without issue (no signs or symptoms).  Able to work in  garden longer yesterday. Attributes this to her  Pulmoanry Rehab exercise routine helping with improved stamina. Due to high co pays Alyana plans to continue exercise at [facility] fitness center through [place].        Discharge Exercise Prescription (Final Exercise Prescription Changes):     Exercise Prescription Changes - 07/08/15 0700    Exercise Review   Progression Yes   Response to Exercise   Blood Pressure (Admit) 110/70 mmHg   Blood Pressure (Exercise) 110/64 mmHg   Blood Pressure (Exit) 114/68 mmHg   Heart Rate (Admit) 89 bpm   Heart Rate (Exercise) 87 bpm   Heart Rate (Exit) 75 bpm   Oxygen Saturation (Admit) 93 %   Oxygen Saturation (Exercise) 92 %   Oxygen Saturation (Exit) 95 %   Rating of Perceived Exertion (Exercise) 12   Perceived Dyspnea (Exercise) 3   Symptoms None   Intensity Rest + 30   Progression   Progression Continue to progress workloads to maintain intensity without signs/symptoms of physical distress.   Resistance Training   Training Prescription Yes   Weight 1   Reps 10-15   Treadmill   MPH 1.3   Grade 0   Minutes 20   NuStep   Level 3   Watts 40   Minutes 15   Biostep-RELP   Level 2   Watts 15   Minutes 15   Home Exercise Plan   Plans to continue exercise at [employer]   Frequency Add 1 additional day to program exercise sessions.       Nutrition:  Target Goals: Understanding of nutrition guidelines, daily intake of sodium <1528m, cholesterol <2091m calories 30% from fat and 7% or less from saturated fats, daily to have 5 or more servings of fruits and vegetables.  Biometrics:     Pre Biometrics - 05/06/15 1649    Pre Biometrics   Height 5' 2.5" (1.588 m)   Weight 108 lb (48.988 kg)   Waist Circumference 31 inches   Hip Circumference 31.5 inches    Waist to Hip Ratio 0.98 %   BMI (Calculated) 19.5         Post Biometrics - 07/07/15 1436     Post  Biometrics   Height 5' 2.5" (1.588 m)   Weight 106 lb 12.8 oz (48.444 kg)   Waist Circumference 28 inches   Hip Circumference 34.5 inches   Waist to Hip Ratio 0.81 %   BMI (Calculated) 19.3      Nutrition Therapy Plan and Nutrition Goals:     Nutrition Therapy & Goals - 05/06/15 1330    Nutrition Therapy   Diet Ms NeWargarefers not to meet with the dietitian. She cooks healthy and eats small portion sizes.      Nutrition Discharge: Rate Your Plate Scores:   Psychosocial: Target Goals: Acknowledge presence or absence of depression, maximize coping skills, provide positive support system. Participant is able to verbalize types and ability to use techniques and skills needed for reducing stress and depression.  Initial Review & Psychosocial Screening:     Initial Psych Review & Screening - 05/06/15 1330    Family Dynamics   Good Support System? Yes   Comments Ms NeGarinas support from her daughter. Ms NeAcordead an article on depression and told her doctor she had the article's symptoms, mainly not wanting to go out or be involved with anyone. He put her on lexapro which helped.   Barriers   Psychosocial barriers to  participate in program The patient should benefit from training in stress management and relaxation.   Screening Interventions   Interventions Program counselor consult;Encouraged to exercise      Quality of Life Scores:     Quality of Life - 07/08/15 0740    Quality of Life Scores   Health/Function Pre 15.07 %   Health/Function Post 17.97 %   Health/Function % Change 19.24 %   Socioeconomic Pre 26 %   Socioeconomic Post 25.25 %   Socioeconomic % Change  -2.88 %   Psych/Spiritual Pre 19.43 %   Psych/Spiritual Post 23.07 %   Psych/Spiritual % Change 18.73 %   Family Pre 19.5 %   Family Post 22.5 %   Family % Change 15.38 %   GLOBAL Pre 18.63 %    GLOBAL Post 21.02 %   GLOBAL % Change 12.83 %      PHQ-9:     Recent Review Flowsheet Data    Depression screen [facility] 2/9 07/08/2015 05/06/2015   Decreased Interest 0 1   Down, Depressed, Hopeless 0 0   PHQ - 2 Score 0 1   Altered sleeping 1 0   Tired, decreased energy 1 1   Change in appetite 1 1   Feeling bad or failure about yourself  0 0   Trouble concentrating 0 0   Moving slowly or fidgety/restless 0 0   Suicidal thoughts 0 0   PHQ-9 Score 3 3   Difficult doing work/chores Somewhat difficult Somewhat difficult      Psychosocial Evaluation and Intervention:     Psychosocial Evaluation - 05/12/15 1125    Psychosocial Evaluation & Interventions   Interventions Encouraged to exercise with the program and follow exercise prescription   Comments Counselor met with Ms. Doss for initial psychosocial evaluation today.  She is a 82 year old cancer survivor who had her left lung removed in 2006 due to lung cancer.  She lives alone but has a strong support system of a daughter who lives closeby; friends she plays [employer] with weekly and active involvement in her local church.  She states she sleeps well with the medication the Dr. prescribed for this issue and she admits to a history of depression and anxiety, but the Lexapro prescribed several months ago appears to be helping - no current symptoms.  Ms. Mellone states she is typically in a positive mood and has minimal stress in her life.  She has goals to breathe easier without fear.  She plans to join a follow up program at [facility] or attend her local [employer] silver sneakers class upon completion of this program.         Psychosocial Re-Evaluation:     Psychosocial Re-Evaluation      06/18/15 1110 06/20/15 1134 06/25/15 1029       Psychosocial Re-Evaluation   Comments Counselor follow up with Ms. Loncar today reporting she continues to have up and down days with the pollens.  She states her mood is continuing to be mostly positive  and she thinks she may be breathing somewhat better since coming into this program.   She also reports that after a workout in the program, she is "worn out," so doesn't feel that she has experienced increased energy as a result to date.  Her appetite and sleep remain good at this time.  Counselor will continue to follow, and encouraged Ms. Milbrath to report her lack of energy to her doctor.  Nakeda stated that she was able  to work in garden longer yesterday. Attributes this to her  Pulmoanry Rehab exercise routine helping with improved stamina. She enjoys working in the garden and was happy that she was able to do this yesterday Counselor follow up with Ms. Zink stating "May has historically been a bad month" for her.  She states she just lost two of her friends due to pulmonary problems and this has impacted her mood.  Counselor assessed her sleeping and appetite as being typical.  counselor assessed her support system through this sadness with Ms. Wiemann reporting her sister has been with her and will accompany her to one of the funerals of a lifelong/childhood friend.  Counselor encouraged Ms. Goh to continue to take good care of herself and commended her for being here working out today - to make this May a better month.   Counselor will continue to follow with Ms. Lippmann.          Education: Education Goals: Education classes will be provided on a weekly basis, covering required topics. Participant will state understanding/return demonstration of topics presented.  Learning Barriers/Preferences:     Learning Barriers/Preferences - 05/06/15 1330    Learning Barriers/Preferences   Learning Barriers None   Learning Preferences Group Instruction;Individual Instruction;Pictoral;Skilled Demonstration;Verbal Instruction;Video;Written Material      Education Topics: Initial Evaluation Education: - Verbal, written and demonstration of respiratory meds, RPE/PD scales, oximetry and breathing  techniques. Instruction on use of nebulizers and MDIs: cleaning and proper use, rinsing mouth with steroid doses and importance of monitoring MDI activations.          Pulmonary Rehab from 06/25/2015 in [facility] Cardiac and Pulmonary Rehab   Date  05/06/15   Educator  LB   Instruction Review Code  2- meets goals/outcomes      General Nutrition Guidelines/Fats and Fiber: -Group instruction provided by verbal, written material, models and posters to present the general guidelines for heart healthy nutrition. Gives an explanation and review of dietary fats and fiber.      Pulmonary Rehab from 06/25/2015 in [facility] Cardiac and Pulmonary Rehab   Date  05/19/15   Educator  CR   Instruction Review Code  2- meets goals/outcomes      Controlling Sodium/Reading Food Labels: -Group verbal and written material supporting the discussion of sodium use in heart healthy nutrition. Review and explanation with models, verbal and written materials for utilization of the food label.   Exercise Physiology & Risk Factors: - Group verbal and written instruction with models to review the exercise physiology of the cardiovascular system and associated critical values. Details cardiovascular disease risk factors and the goals associated with each risk factor.      Pulmonary Rehab from 06/25/2015 in [facility] Cardiac and Pulmonary Rehab   Date  05/14/15   Educator  SW   Instruction Review Code  2- meets goals/outcomes      Aerobic Exercise & Resistance Training: - Gives group verbal and written discussion on the health impact of inactivity. On the components of aerobic and resistive training programs and the benefits of this training and how to safely progress through these programs.   Flexibility, Balance, General Exercise Guidelines: - Provides group verbal and written instruction on the benefits of flexibility and balance training programs. Provides general exercise guidelines with specific guidelines to those with  heart or lung disease. Demonstration and skill practice provided.      Pulmonary Rehab from 06/25/2015 in [facility] Cardiac and Pulmonary Rehab   Date  06/25/15  Educator  BS   Instruction Review Code  2- meets goals/outcomes      Stress Management: - Provides group verbal and written instruction about the health risks of elevated stress, cause of high stress, and healthy ways to reduce stress.   Depression: - Provides group verbal and written instruction on the correlation between heart/lung disease and depressed mood, treatment options, and the stigmas associated with seeking treatment.      Pulmonary Rehab from 06/25/2015 in [facility] Cardiac and Pulmonary Rehab   Date  05/21/15   Educator  Arlyss Queen   Instruction Review Code  2- meets goals/outcomes      Exercise & Equipment Safety: - Individual verbal instruction and demonstration of equipment use and safety with use of the equipment.      Pulmonary Rehab from 06/25/2015 in [facility] Cardiac and Pulmonary Rehab   Date  05/12/15   Educator  LB   Instruction Review Code  2- meets goals/outcomes      Infection Prevention: - Provides verbal and written material to individual with discussion of infection control including proper hand washing and proper equipment cleaning during exercise session.      Pulmonary Rehab from 06/25/2015 in [facility] Cardiac and Pulmonary Rehab   Date  05/12/15   Educator  LB   Instruction Review Code  2- meets goals/outcomes      Falls Prevention: - Provides verbal and written material to individual with discussion of falls prevention and safety.      Pulmonary Rehab from 06/25/2015 in [facility] Cardiac and Pulmonary Rehab   Date  05/06/15   Educator  LB   Instruction Review Code  2- meets goals/outcomes      Diabetes: - Individual verbal and written instruction to review signs/symptoms of diabetes, desired ranges of glucose level fasting, after meals and with exercise. Advice that pre and post exercise glucose  checks will be done for 3 sessions at entry of program.   Chronic Lung Diseases: - Group verbal and written instruction to review new updates, new respiratory medications, new advancements in procedures and treatments. Provide informative websites and "800" numbers of self-education.      Pulmonary Rehab from 06/25/2015 in [facility] Cardiac and Pulmonary Rehab   Date  06/23/15   Educator  LB   Instruction Review Code  2- meets goals/outcomes      Lung Procedures: - Group verbal and written instruction to describe testing methods done to diagnose lung disease. Review the outcome of test results. Describe the treatment choices: Pulmonary Function Tests, ABGs and oximetry.   Energy Conservation: - Provide group verbal and written instruction for methods to conserve energy, plan and organize activities. Instruct on pacing techniques, use of adaptive equipment and posture/positioning to relieve shortness of breath.   Triggers: - Group verbal and written instruction to review types of environmental controls: home humidity, furnaces, filters, dust mite/pet prevention, HEPA vacuums. To discuss weather changes, air quality and the benefits of nasal washing.      Pulmonary Rehab from 06/25/2015 in [facility] Cardiac and Pulmonary Rehab   Date  06/09/15   Educator  LB   Instruction Review Code  2- meets goals/outcomes      Exacerbations: - Group verbal and written instruction to provide: warning signs, infection symptoms, calling MD promptly, preventive modes, and value of vaccinations. Review: effective airway clearance, coughing and/or vibration techniques. Create an Sports administrator.   Oxygen: - Individual and group verbal and written instruction on oxygen therapy. Includes supplement oxygen, available portable  oxygen systems, continuous and intermittent flow rates, oxygen safety, concentrators, and Medicare reimbursement for oxygen.   Respiratory Medications: - Group verbal and written instruction to  review medications for lung disease. Drug class, frequency, complications, importance of spacers, rinsing mouth after steroid MDI's, and proper cleaning methods for nebulizers.      Pulmonary Rehab from 06/25/2015 in [facility] Cardiac and Pulmonary Rehab   Date  05/06/15   Educator  LB   Instruction Review Code  2- meets goals/outcomes      AED/CPR: - Group verbal and written instruction with the use of models to demonstrate the basic use of the AED with the basic ABC's of resuscitation.   Breathing Retraining: - Provides individuals verbal and written instruction on purpose, frequency, and proper technique of diaphragmatic breathing and pursed-lipped breathing. Applies individual practice skills.      Pulmonary Rehab from 06/25/2015 in [facility] Cardiac and Pulmonary Rehab   Date  05/06/15   Educator  LB   Instruction Review Code  2- meets goals/outcomes      Anatomy and Physiology of the Lungs: - Group verbal and written instruction with the use of models to provide basic lung anatomy and physiology related to function, structure and complications of lung disease.   Heart Failure: - Group verbal and written instruction on the basics of heart failure: signs/symptoms, treatments, explanation of ejection fraction, enlarged heart and cardiomyopathy.      Pulmonary Rehab from 06/25/2015 in [facility] Cardiac and Pulmonary Rehab   Date  06/13/15   Educator  [place]   Instruction Review Code  2- meets goals/outcomes      Sleep Apnea: - Individual verbal and written instruction to review Obstructive Sleep Apnea. Review of risk factors, methods for diagnosing and types of masks and machines for OSA.   Anxiety: - Provides group, verbal and written instruction on the correlation between heart/lung disease and anxiety, treatment options, and management of anxiety.   Relaxation: - Provides group, verbal and written instruction about the benefits of relaxation for patients with heart/lung disease. Also  provides patients with examples of relaxation techniques.   Knowledge Questionnaire Score:     Knowledge Questionnaire Score - 07/07/15 0734    Knowledge Questionnaire Score   Post Score 9/10       Core Components/Risk Factors/Patient Goals at Admission:     Personal Goals and Risk Factors at Admission - 05/06/15 1330    Core Components/Risk Factors/Patient Goals on Admission   Sedentary Yes  Ms Parcell likes to work in her yard and use to [employer].   Intervention Provide advice, education, support and counseling about physical activity/exercise needs.;Develop an individualized exercise prescription for aerobic and resistive training based on initial evaluation findings, risk stratification, comorbidities and participant's personal goals.   Expected Outcomes Achievement of increased cardiorespiratory fitness and enhanced flexibility, muscular endurance and strength shown through measurements of functional capaciy and personal statement of participant.   Improve shortness of breath with ADL's Yes   Intervention Provide education, individualized exercise plan and daily activity instruction to help decrease symptoms of SOB with activities of daily living.   Expected Outcomes Short Term: Achieves a reduction of symptoms when performing activities of daily living.   Develop more efficient breathing techniques such as purse lipped breathing and diaphragmatic breathing; and practicing self-pacing with activity Yes   Intervention Provide education, demonstration and support about specific breathing techniuqes utilized for more efficient breathing. Include techniques such as pursed lipped breathing, diaphragmatic breathing and self-pacing activity.  Expected Outcomes Short Term: Participant will be able to demonstrate and use breathing techniques as needed throughout daily activities.   Increase knowledge of respiratory medications and ability to use respiratory devices properly  Yes  Ms Weesner  is using Advair, Spiriva, and Ventolin. She only uses her Ventolin when out and does not want to carry a spacer in her purse.  She wears 2l/m [place] at night only and uses Inogen.   Intervention Provide education and demonstration as needed of appropriate use of medications, inhalers, and oxygen therapy.   Expected Outcomes Short Term: Achieves understanding of medications use. Understands that oxygen is a medication prescribed by physician. Demonstrates appropriate use of inhaler and oxygen therapy.      Core Components/Risk Factors/Patient Goals Review:      Goals and Risk Factor Review      05/12/15 1600 05/14/15 1000 05/23/15 1248 05/23/15 1252 06/23/15 1817   Core Components/Risk Factors/Patient Goals Review   Personal Goals Review Develop more efficient breathing techniques such as purse lipped breathing and diaphragmatic breathing and practicing self-pacing with activity. Develop more efficient breathing techniques such as purse lipped breathing and diaphragmatic breathing and practicing self-pacing with activity. Improve shortness of breath with ADL's Increase knowledge of respiratory medications and ability to use respiratory devices properly. Sedentary;Increase Strength and Stamina;Improve shortness of breath with ADL's;Develop more efficient breathing techniques such as purse lipped breathing and diaphragmatic breathing and practicing self-pacing with activity.;Increase knowledge of respiratory medications and ability to use respiratory devices properly.   Review Ms Chalmers used PLB with her exercise goals and used good technique. Ms Thelander states she uses PLB with activity at home, especially her yardwork.  Ms. Zaragoza says that she has a scratchy throat today.  We discussed the importance of an antihistimine during the spring months and the signs and symptoms of infection. Ms. Apel takes Ventolin as a rescue inhaler.  Spacer was given to her today. Janitza continues in [employer].  Jazzalyn states  ever since the pollen has arrived she has not been breathing as well, which impacts her ability to exercise and do things she would like to do. Mariyana states she has an antihistamine to take, but fdoes not like to take it, as she is taking so many meds already according to pt.   Giovanna would like to get outside and do her yard work.  Ardyce explained she becomes anxious when she cannot get outside to do her yard work.  Effie reports for the first time she was able to sweep her entire kitchen without stopping to catch her breath.  Tyeasha reports use of pursed lip breathing to help her get tasks done.  Reviewed correct use of Adviar and Spiriva with Jana Half.  Good technique described by patient.     Expected Outcomes Continue using PLB during exercise goals and with activites at home for the continuation of [facility] with out cueing. Continue using the PLB, especially with her activites outside. Ms. Molden says that she will watch this closely and will start taking an antihistimine if the scratchy throat continues to get worse.  This medication should help with her drainage and scratchy throat. The spacer will help her inhale more of her medication and get quicker relief from her rescue inhaler. Lizette will continue in [place] to increase her stamina and energy as well as to promote lung expansion to help maximize air exchange.  Johnica will continue using purse lip breathing technique to help minimize SOB.  07/08/15 0750           Core Components/Risk Factors/Patient Goals Review   Personal Goals Review Increase Strength and Stamina;Improve shortness of breath with ADL's;Increase knowledge of respiratory medications and ability to use respiratory devices properly.;Develop more efficient breathing techniques such as purse lipped breathing and diaphragmatic breathing and practicing self-pacing with activity.       Review Ms Oley graduated today from [employer]. She has a $20.00 copay and could not  continue in [place]. Her insurance does have Silver and Fit, so she will continue exercise in our [employer] program at least 2 days / week. She does a lot of yardwork which keeps her very active. She has learned to pace herself and use PLB. Also her inhalers help manage her COPD, so she is very compliant with their use. Her increase in stamina is represented by the great job she has done on the Treadmill - in the beginning of [facility],she was doing 3 sets of 5 minutes and now is performing 11mns on the treadmill. Ms NSignerrecognizes the importance of exercise and staying active in the management of her COPD.        Expected Outcomes Attend FAvon2-3days/week and continue managing her COPD with pacing , PLB, proper use of her inhalers, and management of exacerbations.          Core Components/Risk Factors/Patient Goals at Discharge (Final Review):      Goals and Risk Factor Review - 07/08/15 0750    Core Components/Risk Factors/Patient Goals Review   Personal Goals Review Increase Strength and Stamina;Improve shortness of breath with ADL's;Increase knowledge of respiratory medications and ability to use respiratory devices properly.;Develop more efficient breathing techniques such as purse lipped breathing and diaphragmatic breathing and practicing self-pacing with activity.   Review Ms NMinkgraduated today from [employer] She has a $20.00 copay and [place] continue in [place] Her [place] does have Silver and Fit, so she will continue exercise in our [place] at least 2 days / week. She does a lot of yardwork which keeps her very active. She has learned to pace herself and use PLB. Also her inhalers help manage her COPD, so she is very compliant with their use. Her increase in stamina is represented by the great job she has done on the Treadmill - in the beginning of [facility],she was doing 3 sets of 5 minutes and now is performing 268ms on the treadmill. Ms NeMeridarecognizes the importance of exercise and staying active in the management of her COPD.    Expected Outcomes [place] and continue managing her COPD with pacing , PLB, proper use of her inhalers, and management of exacerbations.      ITP Comments:     ITP Comments      07/02/15 1548           ITP Comments Ms NeSzetoas a copay of $20.00 with each visit, so she plans to graduate early and use her [employer] to exercise in [place].          Comments: Ms NeHarklessraduated today from [employer] has a $20.00 co-pay and can no longer attend [facility]. Having Silver Sneakers with her insurance, she will continue exercise in [place]-3 days/week.

## 2015-07-22 DIAGNOSIS — Z85828 Personal history of other malignant neoplasm of skin: Secondary | ICD-10-CM | POA: Diagnosis not present

## 2015-07-22 DIAGNOSIS — D485 Neoplasm of uncertain behavior of skin: Secondary | ICD-10-CM | POA: Diagnosis not present

## 2015-07-22 DIAGNOSIS — S80261A Insect bite (nonvenomous), right knee, initial encounter: Secondary | ICD-10-CM | POA: Diagnosis not present

## 2015-07-22 DIAGNOSIS — S70362A Insect bite (nonvenomous), left thigh, initial encounter: Secondary | ICD-10-CM | POA: Diagnosis not present

## 2015-07-22 DIAGNOSIS — L821 Other seborrheic keratosis: Secondary | ICD-10-CM | POA: Diagnosis not present

## 2015-07-22 DIAGNOSIS — D225 Melanocytic nevi of trunk: Secondary | ICD-10-CM | POA: Diagnosis not present

## 2015-07-28 ENCOUNTER — Other Ambulatory Visit: Payer: Self-pay | Admitting: Internal Medicine

## 2015-07-28 DIAGNOSIS — Z1231 Encounter for screening mammogram for malignant neoplasm of breast: Secondary | ICD-10-CM

## 2015-08-04 DIAGNOSIS — J449 Chronic obstructive pulmonary disease, unspecified: Secondary | ICD-10-CM | POA: Diagnosis not present

## 2015-08-20 DIAGNOSIS — R630 Anorexia: Secondary | ICD-10-CM | POA: Diagnosis not present

## 2015-08-20 DIAGNOSIS — J431 Panlobular emphysema: Secondary | ICD-10-CM | POA: Diagnosis not present

## 2015-08-20 DIAGNOSIS — E7800 Pure hypercholesterolemia, unspecified: Secondary | ICD-10-CM | POA: Diagnosis not present

## 2015-08-20 DIAGNOSIS — Z1329 Encounter for screening for other suspected endocrine disorder: Secondary | ICD-10-CM | POA: Diagnosis not present

## 2015-08-20 DIAGNOSIS — F419 Anxiety disorder, unspecified: Secondary | ICD-10-CM | POA: Diagnosis not present

## 2015-08-20 DIAGNOSIS — I739 Peripheral vascular disease, unspecified: Secondary | ICD-10-CM | POA: Diagnosis not present

## 2015-08-20 DIAGNOSIS — Z79899 Other long term (current) drug therapy: Secondary | ICD-10-CM | POA: Diagnosis not present

## 2015-08-20 DIAGNOSIS — K219 Gastro-esophageal reflux disease without esophagitis: Secondary | ICD-10-CM | POA: Diagnosis not present

## 2015-08-20 DIAGNOSIS — F329 Major depressive disorder, single episode, unspecified: Secondary | ICD-10-CM | POA: Diagnosis not present

## 2015-09-01 ENCOUNTER — Inpatient Hospital Stay: Payer: PPO | Attending: Oncology | Admitting: Oncology

## 2015-09-01 VITALS — BP 131/84 | HR 77 | Temp 97.6°F | Resp 18 | Wt 108.2 lb

## 2015-09-01 DIAGNOSIS — Z79899 Other long term (current) drug therapy: Secondary | ICD-10-CM | POA: Diagnosis not present

## 2015-09-01 DIAGNOSIS — E785 Hyperlipidemia, unspecified: Secondary | ICD-10-CM

## 2015-09-01 DIAGNOSIS — Z85118 Personal history of other malignant neoplasm of bronchus and lung: Secondary | ICD-10-CM | POA: Diagnosis not present

## 2015-09-01 DIAGNOSIS — K219 Gastro-esophageal reflux disease without esophagitis: Secondary | ICD-10-CM | POA: Diagnosis not present

## 2015-09-01 DIAGNOSIS — R0602 Shortness of breath: Secondary | ICD-10-CM | POA: Insufficient documentation

## 2015-09-01 DIAGNOSIS — Z803 Family history of malignant neoplasm of breast: Secondary | ICD-10-CM | POA: Diagnosis not present

## 2015-09-01 DIAGNOSIS — C3412 Malignant neoplasm of upper lobe, left bronchus or lung: Secondary | ICD-10-CM

## 2015-09-01 DIAGNOSIS — Z809 Family history of malignant neoplasm, unspecified: Secondary | ICD-10-CM | POA: Diagnosis not present

## 2015-09-01 DIAGNOSIS — Z87891 Personal history of nicotine dependence: Secondary | ICD-10-CM | POA: Insufficient documentation

## 2015-09-01 NOTE — Progress Notes (Signed)
Has occasional shortness of breath. States is due for CT scan in October.

## 2015-09-03 DIAGNOSIS — J449 Chronic obstructive pulmonary disease, unspecified: Secondary | ICD-10-CM | POA: Diagnosis not present

## 2015-09-08 NOTE — Progress Notes (Signed)
[name]  [phone]  [phone]     [name] DOB: 1934/01/21  MR#: [id number]  UYQ#:[id number]  Patient Care Team: Idelle Crouch, MD as PCP - General (Internal Medicine) Robert Bellow, MD (General Surgery) Idelle Crouch, MD (Internal Medicine)  CHIEF COMPLAINT:  Left upper lobe lung cancer, status post wedge resection in February 2006.  INTERVAL HISTORY:  Patient returns to clinic today for routine evaluation. She 10 years have occasional shortness of breath, but this is chronic and unchanged. She requires oxygen, but only at night. She has no neurologic complaints. She denies any recent fevers or illnesses. She has good appetite and denies weight loss. She denies any chest pain, cough, or hemoptysis. She denies any nausea, vomiting, constipation, or diarrhea. She has no urinary complaints. Patient feels at her baseline and offers no further specific complaints today.   REVIEW OF SYSTEMS:   Review of Systems  Constitutional: Negative.  Negative for fever, malaise/fatigue and weight loss.  Respiratory: Positive for shortness of breath. Negative for cough and hemoptysis.   Cardiovascular: Negative.  Negative for chest pain.  Gastrointestinal: Negative.  Negative for abdominal pain.  Genitourinary: Negative.   Musculoskeletal: Negative.   Neurological: Negative.  Negative for weakness.  Psychiatric/Behavioral: Negative.  The patient is not nervous/anxious.     As per HPI. Otherwise, a complete review of systems is negatve.  ONCOLOGY HISTORY: Oncology History   Chief Complaint/Problem List:  Carcinoma of lung, left upper lobe. Status post wedge resection in February 2006. 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  AJCC Staging: pT2N2M0 Stage Grouping: IIIA HPI:   82 year old lady with previous history of carcinoma of lung status post resection.  T1 N2 M0 tumo     [name]   07/24/2013 Initial Diagnosis    Cancer of lung      PAST MEDICAL HISTORY: Past Medical History:  Diagnosis Date  . Cancer ([name])    lung, chemo  . GERD (gastroesophageal reflux disease)   . Hyperlipidemia   . Lung disease   . Ovarian cyst 07/30/2014  . Shortness of breath     PAST SURGICAL HISTORY: Past Surgical History:  Procedure Laterality Date  . BREAST BIOPSY Left 11-2012   neg., papilloma  . CHOLECYSTECTOMY  2014  . LUNG SURGERY  2007   upper left lobe  . TONSILLECTOMY      FAMILY HISTORY Family History  Problem Relation Age of Onset  . Cancer Daughter     breast  . Breast cancer Daughter 69    GYNECOLOGIC HISTORY:  No LMP recorded. Patient is postmenopausal.     ADVANCED DIRECTIVES:    HEALTH MAINTENANCE: Social History  Substance Use Topics  . Smoking status: Former Smoker    Packs/day: 1.50    Years: 40.00    Types: Cigarettes    Quit date: 10/01/2001  . Smokeless tobacco: Former Systems developer  . Alcohol use Yes     Comment: socially     Colonoscopy:  PAP:  Bone density:  Lipid panel:  Allergies  Allergen Reactions  . Morphine And Related Other (See Comments)    halluncinations  . Sulfonamide Derivatives   . Tetanus Toxoid     Current Outpatient Prescriptions  Medication Sig Dispense Refill  . ADVAIR DISKUS 250-50 MCG/DOSE AEPB INHALE 1 PUFF TWICE DAILY.  RINSE MOUTH AFTER EACH USE 60 each 3  . aspirin 81 MG tablet Take 81 mg by mouth daily.    Marland Kitchen  atorvastatin (LIPITOR) 20 MG tablet     . Calcium Carb-Cholecalciferol (CALCIUM 600 + D PO) Take 1 tablet by mouth daily.    . cholecalciferol (VITAMIN D) 1000 UNITS tablet Take 1,000 Units by mouth daily.    Marland Kitchen escitalopram (LEXAPRO) 10 MG tablet Take 1 tablet by mouth daily.    Marland Kitchen levalbuterol (XOPENEX HFA) 45 MCG/ACT inhaler Inhale into the lungs every 4 (four) hours as needed for wheezing.    Marland Kitchen LORazepam (ATIVAN) 1 MG tablet Take 0.5 mg by mouth at bedtime.     . mirtazapine (REMERON) 30 MG tablet  Take 1 tablet by mouth daily.    . Multiple Vitamin (MULTIVITAMIN) tablet Take 1 tablet by mouth daily.    Marland Kitchen omeprazole (PRILOSEC) 40 MG capsule Take 1 capsule by mouth daily.    . OXYGEN Inhale into the lungs at bedtime.    . rosuvastatin (CRESTOR) 20 MG tablet Take 20 mg by mouth daily.    Marland Kitchen SPIRIVA HANDIHALER 18 MCG inhalation capsule Place 18 mcg into inhaler and inhale daily.      No current facility-administered medications for this visit.     OBJECTIVE: BP 131/84 (BP Location: Right Arm, Patient Position: Sitting)   Pulse 77   Temp 97.6 F (36.4 C) (Tympanic)   Resp 18   Wt 108 lb 3.9 oz (49.1 kg)   BMI 19.48 kg/m    Body mass index is 19.48 kg/m.    ECOG FS:1 - Symptomatic but completely ambulatory  General: Well-developed, well-nourished, no acute distress. Eyes: Pink conjunctiva, anicteric sclera. Lungs:  Rare scattered wheezing. Heart: Regular rate and rhythm. No rubs, murmurs, or gallops. Abdomen: Soft, nontender, nondistended. No organomegaly noted, normoactive bowel sounds. Musculoskeletal: No edema, cyanosis, or clubbing. Neuro: Alert, answering all questions appropriately. Cranial nerves grossly intact. Skin: No rashes or petechiae noted. Psych: Normal affect.   LAB RESULTS:  No visits with results within 3 Day(s) from this visit.  Latest known visit with results is:  Clinical Support on 12/25/2014  Component Date Value Ref Range Status  . FVC-Pre 12/25/2014 1.48  L Preliminary  . FVC-%Pred-Pre 12/25/2014 61  % Preliminary  . FVC-Post 12/25/2014 1.58  L Preliminary  . FVC-%Pred-Post 12/25/2014 65  % Preliminary  . FVC-%Change-Post 12/25/2014 6  % Preliminary  . FEV1-Pre 12/25/2014 0.59  L Preliminary  . FEV1-%Pred-Pre 12/25/2014 32  % Preliminary  . FEV1-Post 12/25/2014 0.61  L Preliminary  . FEV1-%Pred-Post 12/25/2014 34  % Preliminary  . FEV1-%Change-Post 12/25/2014 4  % Preliminary  . FEV6-Pre 12/25/2014 1.44  L Preliminary  . FEV6-%Pred-Pre  12/25/2014 63  % Preliminary  . FEV6-Post 12/25/2014 1.53  L Preliminary  . FEV6-%Pred-Post 12/25/2014 67  % Preliminary  . FEV6-%Change-Post 12/25/2014 6  % Preliminary  . Pre FEV1/FVC ratio 12/25/2014 40  % Preliminary  . FEV1FVC-%Pred-Pre 12/25/2014 53  % Preliminary  . Post FEV1/FVC ratio 12/25/2014 39  % Preliminary  . FEV1FVC-%Change-Post 12/25/2014 -2  % Preliminary  . Pre FEV6/FVC Ratio 12/25/2014 97  % Preliminary  . FEV6FVC-%Pred-Pre 12/25/2014 102  % Preliminary  . Post FEV6/FVC ratio 12/25/2014 97  % Preliminary  . FEV6FVC-%Pred-Post 12/25/2014 102  % Preliminary  . FEV6FVC-%Change-Post 12/25/2014 0  % Preliminary  . FEF 25-75 Pre 12/25/2014 0.26  L/sec Preliminary  . FEF2575-%Pred-Pre 12/25/2014 19  % Preliminary  . FEF 25-75 Post 12/25/2014 0.31  L/sec Preliminary  . FEF2575-%Pred-Post 12/25/2014 22  % Preliminary  . FEF2575-%Change-Post 12/25/2014 19  % Preliminary  .  DLCO unc 12/25/2014 6.41  ml/min/mmHg Preliminary  . DLCO unc % pred 12/25/2014 29  % Preliminary  . DL/VA 12/25/2014 2.33  ml/min/mmHg/L Preliminary  . DL/VA % pred 12/25/2014 51  % Preliminary    STUDIES: No results found.  ASSESSMENT:  Left upper lobe lung cancer, status post wedge resection in February 2006.  PLAN:    1. Left upper lobe lung cancer, status post wedge resection in February 2006:  No evidence of disease. Patient's most recent CT scan on December 03, 2014 reviewed independently and revealed no evidence of recurrent or progressive disease. Patient is now greater than 10 years removed from her treatment. After lengthy discussion with the patient, no further intervention or follow-up is needed at this time. She does not require additional CT scans unless there is suspicion of recurrence. These can be ordered by her primary care physician.  2. Shortness of breath: Continue treatment and evaluation by pulmonary physician.  Patient expressed understanding and was in agreement with this plan.  She also understands that She can call clinic at any time with any questions, concerns, or complaints.    Lloyd Huger, MD   09/08/2015 1:34 PM

## 2015-09-10 ENCOUNTER — Ambulatory Visit
Admission: RE | Admit: 2015-09-10 | Discharge: 2015-09-10 | Disposition: A | Discharge status: Home / Self Care | Payer: PPO | Source: Ambulatory Visit | Attending: Internal Medicine | Admitting: Internal Medicine

## 2015-09-10 ENCOUNTER — Other Ambulatory Visit: Payer: Self-pay | Admitting: Internal Medicine

## 2015-09-10 DIAGNOSIS — Z1231 Encounter for screening mammogram for malignant neoplasm of breast: Secondary | ICD-10-CM

## 2015-09-25 ENCOUNTER — Ambulatory Visit
Admission: RE | Admit: 2015-09-25 | Discharge: 2015-09-25 | Disposition: A | Discharge status: Home / Self Care | Payer: PPO | Source: Ambulatory Visit | Attending: Obstetrics and Gynecology | Admitting: Obstetrics and Gynecology

## 2015-09-25 DIAGNOSIS — N83209 Unspecified ovarian cyst, unspecified side: Secondary | ICD-10-CM

## 2015-09-25 DIAGNOSIS — N83202 Unspecified ovarian cyst, left side: Secondary | ICD-10-CM | POA: Insufficient documentation

## 2015-10-01 ENCOUNTER — Encounter (INDEPENDENT_AMBULATORY_CARE_PROVIDER_SITE_OTHER): Payer: Self-pay

## 2015-10-01 ENCOUNTER — Inpatient Hospital Stay: Payer: PPO | Attending: Oncology | Admitting: Obstetrics and Gynecology

## 2015-10-01 VITALS — BP 148/75 | HR 71 | Temp 98.0°F | Ht 62.0 in | Wt 111.3 lb

## 2015-10-01 DIAGNOSIS — Z87891 Personal history of nicotine dependence: Secondary | ICD-10-CM | POA: Diagnosis not present

## 2015-10-01 DIAGNOSIS — N83202 Unspecified ovarian cyst, left side: Secondary | ICD-10-CM | POA: Diagnosis not present

## 2015-10-01 DIAGNOSIS — Z85118 Personal history of other malignant neoplasm of bronchus and lung: Secondary | ICD-10-CM | POA: Insufficient documentation

## 2015-10-01 DIAGNOSIS — I7090 Unspecified atherosclerosis: Secondary | ICD-10-CM | POA: Diagnosis not present

## 2015-10-01 DIAGNOSIS — J449 Chronic obstructive pulmonary disease, unspecified: Secondary | ICD-10-CM | POA: Insufficient documentation

## 2015-10-01 DIAGNOSIS — E785 Hyperlipidemia, unspecified: Secondary | ICD-10-CM

## 2015-10-01 DIAGNOSIS — F329 Major depressive disorder, single episode, unspecified: Secondary | ICD-10-CM | POA: Insufficient documentation

## 2015-10-01 DIAGNOSIS — Z7982 Long term (current) use of aspirin: Secondary | ICD-10-CM | POA: Diagnosis not present

## 2015-10-01 DIAGNOSIS — N8189 Other female genital prolapse: Secondary | ICD-10-CM | POA: Insufficient documentation

## 2015-10-01 DIAGNOSIS — F419 Anxiety disorder, unspecified: Secondary | ICD-10-CM | POA: Diagnosis not present

## 2015-10-01 DIAGNOSIS — I739 Peripheral vascular disease, unspecified: Secondary | ICD-10-CM

## 2015-10-01 DIAGNOSIS — Z79899 Other long term (current) drug therapy: Secondary | ICD-10-CM | POA: Diagnosis not present

## 2015-10-01 DIAGNOSIS — K219 Gastro-esophageal reflux disease without esophagitis: Secondary | ICD-10-CM | POA: Diagnosis not present

## 2015-10-01 DIAGNOSIS — K5900 Constipation, unspecified: Secondary | ICD-10-CM | POA: Diagnosis not present

## 2015-10-01 NOTE — Progress Notes (Signed)
  Oncology Nurse Navigator Documentation  Navigator Location: [facility] (10/01/15 1100) Navigator Encounter Type: [place] Follow-up (10/01/15 1100)                                          Time Spent with Patient: 15 (10/01/15 1100)   Chaperoned pelvic exam. To follow up in two years for cyst.

## 2015-10-01 NOTE — Progress Notes (Signed)
Gynecologic Oncology Consult Visit   Referring Provider: Dr. Oliva Bustard.   Chief Concern: Ovarian cyst  Subjective:  [name] is a 82 y.o. female who is seen in consultation from Dr.  Oliva Bustard for continued evaluation regarding an left ovarian cyst.  History of lung cancer treated with surgery and chemotherapy 10 years ago.   No pelvic pain or other new issues. She does have some pelvic prolapse and saw Dr Latricia Heft of [place] who recommended that she use SennaS to keep constipation under control, which will reduce straining/prolapse.  No surgery recommended.   No new complaints.  [place] last week stable.  [place] 09/25/15 FINDINGS: Uterus Measurements: 4.7 x 2.1 x 3.6 cm. No fibroids or other mass visualized. Endometrium Thickness: 0.5 cm.  No focal abnormality visualized. Right ovary Measurements: 1.4 x 1.1 x 1.1 cm. Normal appearance/no adnexal mass. Left ovary Measurements: 3.7 x 1.8 x 2.4 cm. Anechoic cystic structure measuring 2.2 x 1.1 x 1.7 cm (previously measured at 1.5 x 1.4 x 1.1 cm and 1.9 x 1.9 x 1.6 cm). A second adjacent cystic structure measures 1.6 x 1.7 x 2.0 cm (previously 1.6 x 1.5 x 1.7 cm). No mural nodularity or evidence of internal vascularity. Other findings No abnormal free fluid.  IMPRESSION: Similar appearance of 2 small cystic lesions affiliated with the left ovary across multiple studies dating back to July of 2013. No significant interval change.     Lab Results  Component Value Date   CA125 27.7 09/04/2013    Gynecologic Oncology History   03/2010  CT scan reveals a 2.9 cm probably cystic enlargement of the L ovary. She has been followed by Dr. Sabra Heck since 2014. Her CA125 values have been normal.   Problem List: Patient Active Problem List   Diagnosis Date Noted  . Pulmonary nodules/lesions, multiple 12/18/2014  . Ovarian cyst 07/30/2014  . Nipple discharge 02/15/2014  . Anxiety and depression 07/24/2013  . Arterial vascular disease  07/24/2013  . Insomnia, persistent 07/24/2013  . Acid reflux 07/24/2013  . HLD (hyperlipidemia) 07/24/2013  . Cancer of upper lobe of left lung ([place]) 07/24/2013  . Peripheral blood vessel disorder ([place]) 07/24/2013  . Papilloma of left breast 11/22/2012  . CAFL (chronic airflow limitation) ([place]) 03/09/2012  . ALLERGIC RHINITIS 06/01/2007  . EMPHYSEMA 06/01/2007  . ASTHMA 06/01/2007  . DYSPNEA 06/01/2007    Past Medical History: Past Medical History:  Diagnosis Date  . Cancer ([place])    lung, chemo  . GERD (gastroesophageal reflux disease)   . Hyperlipidemia   . Lung disease   . Ovarian cyst 07/30/2014  . Shortness of breath     Past Surgical History: Past Surgical History:  Procedure Laterality Date  . BREAST BIOPSY Left 11-2012   neg., papilloma  . CHOLECYSTECTOMY  2014  . LUNG SURGERY  2007   upper left lobe  . TONSILLECTOMY      Past Gynecologic History:  See HPI  OB History:  OB History  Gravida Para Term Preterm AB Living  '2 1       1  '$ SAB TAB Ectopic Multiple Live Births               # Outcome Date GA Lbr Len/2nd Weight Sex Delivery Anes PTL Lv  2 Gravida           1 Para             Obstetric Comments  1st Menstrual Cycle: 14  1st Pregnancy: 19  Family History: Family History  Problem Relation Age of Onset  . Cancer Daughter     breast  . Breast cancer Daughter 64    Social History: Social History   Social History  . Marital status: Widowed    Spouse name: N/A  . Number of children: N/A  . Years of education: N/A   Occupational History  . Not on file.   Social History Main Topics  . Smoking status: Former Smoker    Packs/day: 1.50    Years: 40.00    Types: Cigarettes    Quit date: 10/01/2001  . Smokeless tobacco: Former Systems developer  . Alcohol use Yes     Comment: socially  . Drug use: No  . Sexual activity: Not Currently   Other Topics Concern  . Not on file   Social History Narrative  . No narrative on file     Allergies: Allergies  Allergen Reactions  . Morphine And Related Other (See Comments)    halluncinations  . Sulfonamide Derivatives   . Tetanus Toxoid     Current Medications: Current Outpatient Prescriptions  Medication Sig Dispense Refill  . ADVAIR DISKUS 250-50 MCG/DOSE AEPB INHALE 1 PUFF TWICE DAILY.  RINSE MOUTH AFTER EACH USE 60 each 3  . aspirin 81 MG tablet Take 81 mg by mouth daily.    Marland Kitchen atorvastatin (LIPITOR) 20 MG tablet     . Calcium Carb-Cholecalciferol (CALCIUM 600 + D PO) Take 1 tablet by mouth daily.    . cholecalciferol (VITAMIN D) 1000 UNITS tablet Take 1,000 Units by mouth daily.    Marland Kitchen escitalopram (LEXAPRO) 10 MG tablet Take 1 tablet by mouth daily.    Marland Kitchen levalbuterol (XOPENEX HFA) 45 MCG/ACT inhaler Inhale into the lungs every 4 (four) hours as needed for wheezing.    Marland Kitchen LORazepam (ATIVAN) 1 MG tablet Take 0.5 mg by mouth at bedtime.     . mirtazapine (REMERON) 30 MG tablet Take 1 tablet by mouth daily.    . Multiple Vitamin (MULTIVITAMIN) tablet Take 1 tablet by mouth daily.    Marland Kitchen omeprazole (PRILOSEC) 40 MG capsule Take 1 capsule by mouth daily.    . OXYGEN Inhale into the lungs at bedtime.    Marland Kitchen SPIRIVA HANDIHALER 18 MCG inhalation capsule Place 18 mcg into inhaler and inhale daily.      No current facility-administered medications for this visit.     Review of Systems General: She feels tired, weak  HEENT: no complaints  Lungs: SOB and cough  Cardiac: no complaints  GI: longstanding abdominal bloating and constipation  GU: no complaints  Musculoskeletal: no complaints  Extremities: no complaints  Skin: no complaints  Neuro: no complaints  Endocrine: no complaints  Psych: no complaints       Objective:  Physical Examination:  BP (!) 148/75 (BP Location: Left Arm, Patient Position: Sitting)   Pulse 71   Temp 98 F (36.7 C) (Tympanic)   Ht '5\' 2"'$  (1.575 m)   Wt 111 lb 5.3 oz (50.5 kg)   BMI 20.36 kg/m    ECOG Performance Status: 1 -  Symptomatic but completely ambulatory  General appearance: alert, cooperative and appears stated age HEENT:PERRLA and sclera clear, anicteric  Lungs: clear Heart: regular rate and rhythm Lymph node survey: non-palpable, inguinal Abdomen: soft, non-tender, without masses or organomegaly, no hernias and well healed incision Extremities: extremities normal, atraumatic, no cyanosis or edema Neurological exam reveals alert, oriented, normal speech, no focal findings or movement disorder noted.  Pelvic:  exam chaperoned by nurse;  Vulva: normal appearing vulva with no masses, tenderness or lesions; Vagina: normal vagina, atrophy; Adnexa: no masses palpable; Uterus: uterus is so small it is not palpable, nontender; Cervix: nulliparous appearance; Rectal: not indicated    Assessment:  [name] is a 82 y.o. female diagnosed with asymptomatic persistent ovarian cysts that have been relatively stable for 4 years.  History of lung cancer treated by Dr Oliva Bustard.  Plan:   Problem List Items Addressed This Visit      Genitourinary   Ovarian cyst - Primary    Other Visit Diagnoses   None.    [name] will continue to  be followed clinically with another exam in 2 years, but not pelvic ultrasound unless symptomatic. She will let us know, if there are new symptoms in the interim.   She can review her other ROS with her PCP. Overall she looks well and is clinically stable.   Mellody Drown, MD   CC:  Dr. Louretta Parma, MD [facility] [place], [place] [address] [phone]

## 2015-10-01 NOTE — Progress Notes (Signed)
Patient here for follow up. No complaints today. Here for ultrasound results.

## 2015-10-04 DIAGNOSIS — J449 Chronic obstructive pulmonary disease, unspecified: Secondary | ICD-10-CM | POA: Diagnosis not present

## 2015-11-04 DIAGNOSIS — J449 Chronic obstructive pulmonary disease, unspecified: Secondary | ICD-10-CM | POA: Diagnosis not present

## 2015-12-04 DIAGNOSIS — J449 Chronic obstructive pulmonary disease, unspecified: Secondary | ICD-10-CM | POA: Diagnosis not present

## 2016-01-04 DIAGNOSIS — J449 Chronic obstructive pulmonary disease, unspecified: Secondary | ICD-10-CM | POA: Diagnosis not present

## 2016-02-03 DIAGNOSIS — J449 Chronic obstructive pulmonary disease, unspecified: Secondary | ICD-10-CM | POA: Diagnosis not present

## 2016-03-05 DIAGNOSIS — J449 Chronic obstructive pulmonary disease, unspecified: Secondary | ICD-10-CM | POA: Diagnosis not present

## 2016-04-05 DIAGNOSIS — J449 Chronic obstructive pulmonary disease, unspecified: Secondary | ICD-10-CM | POA: Diagnosis not present

## 2016-04-30 DIAGNOSIS — Z1231 Encounter for screening mammogram for malignant neoplasm of breast: Secondary | ICD-10-CM | POA: Diagnosis not present

## 2016-04-30 DIAGNOSIS — F418 Other specified anxiety disorders: Secondary | ICD-10-CM | POA: Diagnosis not present

## 2016-04-30 DIAGNOSIS — K219 Gastro-esophageal reflux disease without esophagitis: Secondary | ICD-10-CM | POA: Diagnosis not present

## 2016-04-30 DIAGNOSIS — F419 Anxiety disorder, unspecified: Secondary | ICD-10-CM | POA: Diagnosis not present

## 2016-04-30 DIAGNOSIS — C3490 Malignant neoplasm of unspecified part of unspecified bronchus or lung: Secondary | ICD-10-CM | POA: Diagnosis not present

## 2016-04-30 DIAGNOSIS — E7800 Pure hypercholesterolemia, unspecified: Secondary | ICD-10-CM | POA: Diagnosis not present

## 2016-04-30 DIAGNOSIS — J449 Chronic obstructive pulmonary disease, unspecified: Secondary | ICD-10-CM | POA: Diagnosis not present

## 2016-04-30 DIAGNOSIS — J431 Panlobular emphysema: Secondary | ICD-10-CM | POA: Diagnosis not present

## 2016-04-30 DIAGNOSIS — Z79899 Other long term (current) drug therapy: Secondary | ICD-10-CM | POA: Diagnosis not present

## 2016-04-30 DIAGNOSIS — R8290 Unspecified abnormal findings in urine: Secondary | ICD-10-CM | POA: Diagnosis not present

## 2016-04-30 DIAGNOSIS — I2510 Atherosclerotic heart disease of native coronary artery without angina pectoris: Secondary | ICD-10-CM | POA: Diagnosis not present

## 2016-04-30 DIAGNOSIS — R630 Anorexia: Secondary | ICD-10-CM | POA: Diagnosis not present

## 2016-05-03 DIAGNOSIS — J449 Chronic obstructive pulmonary disease, unspecified: Secondary | ICD-10-CM | POA: Diagnosis not present

## 2016-05-19 NOTE — Progress Notes (Signed)
Cardiology Office Note  Date:  05/20/2016   ID:  [name], DOB 03/19/1933, MRN [id number]  PCP:  Idelle Crouch, MD   Chief Complaint  Patient presents with  . other     Referral from [facility]/dx: Cardiovascular disease/Dr Sparks. Pt c/o chest pain at times, sob, aching in right arm. Pt states she seen Dr.Peter Martinique 8-7yr ago. Had cath in 2006. Has had 2 stress heart attacks and lobectomy (left upper lobe). Reviewed meds with pt verbally.    HPI:  83yo woman with  smoking hx, 40 years COPD on inhalers Hyperlipidemia, Lung cancer, lung removed Left 2006 PVD, Presents by referral from Dr. SDoy Hutchingfor consultation chest pain and arm pain  Total chol 176 LDL 81  09/30/2014  Bad lung infection 01/2015 Previously in pulmonary rehab: 06/2015  CT scan chest  09/2014 Images reviewed personally by myself Mild coronary calcifications in the LAD, minimal in the RCA and left circumflex Mild aortic atherosclerosis  Echocardiogram performed March 2009  Report reviewed Normal LV function at that time, mild aortic valve sclerosis  Rare chest pain Take aspirin, under her tongue Few times a year with the aspirin  Bad SOB, even coming in today  On inhalers  EKG Personally reviewed by myself on today's visit shows normal sinus rhythm with rate 79 bpm no significant ST or T-wave changes   PMH:   has a past medical history of Cancer ([place]; GERD (gastroesophageal reflux disease); Hyperlipidemia; Lung disease; Ovarian cyst (07/30/2014); Past heart attack; and Shortness of breath.  PSH:    Past Surgical History:  Procedure Laterality Date  . BREAST BIOPSY Left 11-2012   neg., papilloma  . CHOLECYSTECTOMY  2014  . LUNG SURGERY  2007   upper left lobe  . TONSILLECTOMY      Current Outpatient Prescriptions  Medication Sig Dispense Refill  . ADVAIR DISKUS 250-50 MCG/DOSE AEPB INHALE 1 PUFF TWICE DAILY.  RINSE MOUTH AFTER EACH USE 60 each 3  . aspirin 81 MG tablet Take 81 mg by mouth  daily.    .Marland Kitchenatorvastatin (LIPITOR) 20 MG tablet     . Calcium Carb-Cholecalciferol (CALCIUM 600 + D PO) Take 1 tablet by mouth daily.    . cholecalciferol (VITAMIN D) 1000 UNITS tablet Take 1,000 Units by mouth daily.    .Marland Kitchenescitalopram (LEXAPRO) 10 MG tablet Take 1 tablet by mouth daily.    .Marland Kitchenlevalbuterol (XOPENEX HFA) 45 MCG/ACT inhaler Inhale into the lungs every 4 (four) hours as needed for wheezing.    .Marland KitchenLORazepam (ATIVAN) 1 MG tablet Take 0.5 mg by mouth at bedtime.     . mirtazapine (REMERON) 30 MG tablet Take 1 tablet by mouth daily.    . Multiple Vitamin (MULTIVITAMIN) tablet Take 1 tablet by mouth daily.    .Marland Kitchenomeprazole (PRILOSEC) 40 MG capsule Take 1 capsule by mouth daily.    . OXYGEN Inhale into the lungs at bedtime.    .Marland KitchenSPIRIVA HANDIHALER 18 MCG inhalation capsule Place 18 mcg into inhaler and inhale daily.      No current facility-administered medications for this visit.      Allergies:   Morphine and related; Sulfonamide derivatives; and Tetanus toxoid   Social History:  The patient  reports that she quit smoking about 14 years ago. Her smoking use included Cigarettes. She has a 60.00 pack-year smoking history. She has quit using smokeless tobacco. She reports that she drinks alcohol. She reports that she does not use drugs.  Family History:   family history includes Alcohol abuse in her brother; Aneurysm in her mother; Breast cancer (age of onset: 60) in her daughter; COPD in her sister; Cancer in her daughter; Healthy in her sister; Heart attack in her brother; Heart failure in her father; Hyperlipidemia in her brother, father, mother, and sister; Scleroderma in her sister; Thyroid disease in her mother and sister.    Review of Systems: Review of Systems  Constitutional: Negative.   Respiratory: Positive for shortness of breath.   Cardiovascular: Positive for chest pain.  Gastrointestinal: Negative.   Musculoskeletal: Negative.        Arm pain  Neurological:  Negative.   Psychiatric/Behavioral: Negative.   All other systems reviewed and are negative.    PHYSICAL EXAM: VS:  BP 112/66 (BP Location: Right Arm, Patient Position: Sitting, Cuff Size: Normal)   Pulse 79   Ht 5' 2.5" (1.588 m)   Wt 111 lb 4 oz (50.5 kg)   BMI 20.02 kg/m  , BMI Body mass index is 20.02 kg/m. GEN: Well nourished, well developed, in no acute distress  HEENT: normal  Neck: no JVD, carotid bruits, or masses Cardiac: RRR; no murmurs, rubs, or gallops,no edema  Respiratory: Decreased breath sounds bilaterally, normal work of breathing GI: soft, nontender, nondistended, + BS MS: no deformity or atrophy  Skin: warm and dry, no rash Neuro:  Strength and sensation are intact Psych: euthymic mood, full affect    Recent Labs: No results found for requested labs within last 8760 hours.    Lipid Panel No results found for: CHOL, HDL, LDLCALC, TRIG    Wt Readings from Last 3 Encounters:  05/20/16 111 lb 4 oz (50.5 kg)  10/01/15 111 lb 5.3 oz (50.5 kg)  09/01/15 108 lb 3.9 oz (49.1 kg)       ASSESSMENT AND PLAN:  Mixed hyperlipidemia - Plan: EKG 12-Lead CT scan 2016 showing coronary disease, aortic atherosclerosis Recommended she consider increasing Lipitor up to 40 mg daily She will talk with Dr. Doy Hutching Peripheral blood vessel disorder ([place]) - Plan: EKG 12-Lead  EMPHYSEMA - Plan: EKG 12-Lead Stressed importance of avoiding crowds, handwashing, pneumonia shot She is on several inhalers  DYSPNEA - Plan: EKG 12-Lead Worsening symptoms of shortness of breath, unable to exclude ischemia. Unable to treadmill. Myoview ordered after long discussion  Cancer of upper lobe of left lung ([place]) - Plan: EKG 12-Lead Lobectomy, likely contributing to shortness of breath symptoms Stable, no recent exacerbations of her COPD  Arterial vascular disease - Plan: EKG 12-Lead Aortic atherosclerosis seen on CT scan, mild to moderate  Chest pain/Tightness Rare episodes  though concerning for ischemia Coronary artery disease seen on CT scan August 2016 Severe shortness of breath, unable to treadmill We have recommended a pharmacologic Myoview  Disposition:   F/U  12 months  Total encounter time more than 45 minutes Greater than 50% was spent in counseling and coordination of care with the patient    Orders Placed This Encounter  Procedures  . EKG 12-Lead     Signed, Esmond Plants, M.D., Ph.D. 05/20/2016  [place], [place]

## 2016-05-20 ENCOUNTER — Ambulatory Visit (INDEPENDENT_AMBULATORY_CARE_PROVIDER_SITE_OTHER): Payer: PPO | Admitting: Cardiovascular Disease

## 2016-05-20 ENCOUNTER — Encounter: Payer: Self-pay | Admitting: Cardiovascular Disease

## 2016-05-20 VITALS — BP 112/66 | HR 79 | Ht 62.5 in | Wt 111.2 lb

## 2016-05-20 DIAGNOSIS — C3412 Malignant neoplasm of upper lobe, left bronchus or lung: Secondary | ICD-10-CM | POA: Diagnosis not present

## 2016-05-20 DIAGNOSIS — I209 Angina pectoris, unspecified: Secondary | ICD-10-CM

## 2016-05-20 DIAGNOSIS — I739 Peripheral vascular disease, unspecified: Secondary | ICD-10-CM

## 2016-05-20 DIAGNOSIS — I7090 Unspecified atherosclerosis: Secondary | ICD-10-CM | POA: Diagnosis not present

## 2016-05-20 DIAGNOSIS — R0602 Shortness of breath: Secondary | ICD-10-CM

## 2016-05-20 DIAGNOSIS — E782 Mixed hyperlipidemia: Secondary | ICD-10-CM | POA: Diagnosis not present

## 2016-05-20 DIAGNOSIS — J438 Other emphysema: Secondary | ICD-10-CM

## 2016-05-20 DIAGNOSIS — I259 Chronic ischemic heart disease, unspecified: Secondary | ICD-10-CM

## 2016-05-20 DIAGNOSIS — I700 Atherosclerosis of aorta: Secondary | ICD-10-CM | POA: Insufficient documentation

## 2016-05-20 NOTE — Patient Instructions (Addendum)
Medication Instructions:   No medication changes made  Talk with Dr. Doy Hutching about increasing lipitor up to 40 mg daily  Labwork:  No new labs needed  Testing/Procedures:  We will order a lexiscan myoview for shortness of breath and chest paion  [facility] MYOVIEW  Your caregiver has ordered a Stress Test with nuclear imaging. The purpose of this test is to evaluate the blood supply to your heart muscle. This procedure is referred to as a "Non-Invasive Stress Test." This is because other than having an IV started in your vein, nothing is inserted or "invades" your body. Cardiac stress tests are done to find areas of poor blood flow to the heart by determining the extent of coronary artery disease (CAD). Some patients exercise on a treadmill, which naturally increases the blood flow to your heart, while others who are  unable to walk on a treadmill due to physical limitations have a pharmacologic/chemical stress agent called Lexiscan . This medicine will mimic walking on a treadmill by temporarily increasing your coronary blood flow.   Please note: these test may take anywhere between 2-4 hours to complete  PLEASE REPORT TO [facility] AT THE FIRST DESK WILL DIRECT YOU WHERE TO GO  Date of Procedure:_____Wednesday, April 11_______  Arrival Time for Procedure:____7:15 am__________   How to prepare for your Myoview test:  1. Do not eat or drink after midnight 2. No caffeine for 24 hours prior to test 3. No smoking 24 hours prior to test. 4. Your medication may be taken with water.  If your doctor stopped a medication because of this test, do not take that medication. 5. Ladies, please do not wear dresses.  Skirts or pants are appropriate. Please wear a short sleeve shirt. 6. No perfume, cologne or lotion.  I recommend watching educational videos on topics of interest to you at:       [email or web address].com  Enter code: [email or web address]    Follow-Up: It was a pleasure  seeing you in the office today. Please call us if you have new issues that need to be addressed before your next appt.  [phone]  Your physician wants you to follow-up in: 12 months.  You will receive a reminder letter in the mail two months in advance. If you don't receive a letter, please call our office to schedule the follow-up appointment.  If you need a refill on your cardiac medications before your next appointment, please call your pharmacy.    Pharmacologic Stress Electrocardiogram Introduction A pharmacologic stress electrocardiogram is a heart (cardiac) test that uses nuclear imaging to evaluate the blood supply to your heart. This test may also be called a pharmacologic stress electrocardiography. Pharmacologic means that a medicine is used to increase your heart rate and blood pressure. This stress test is done to find areas of poor blood flow to the heart by determining the extent of coronary artery disease (CAD). Some people exercise on a treadmill, which naturally increases the blood flow to the heart. For those people unable to exercise on a treadmill, a medicine is used. This medicine stimulates your heart and will cause your heart to beat harder and more quickly, as if you were exercising. Pharmacologic stress tests can help determine:  The adequacy of blood flow to your heart during increased levels of activity in order to clear you for discharge home.  The extent of coronary artery blockage caused by CAD.  Your prognosis if you have suffered a heart attack.  The effectiveness of cardiac procedures done, such as an angioplasty, which can increase the circulation in your coronary arteries.  Causes of chest pain or pressure. LET [facility] CARE PROVIDER KNOW ABOUT:  Any allergies you have.  All medicines you are taking, including vitamins, herbs, eye drops, creams, and over-the-counter medicines.  Previous problems you or members of your family have had with  the use of anesthetics.  Any blood disorders you have.  Previous surgeries you have had.  Medical conditions you have.  Possibility of pregnancy, if this applies.  If you are currently breastfeeding. RISKS AND COMPLICATIONS Generally, this is a safe procedure. However, as with any procedure, complications can occur. Possible complications include:  You develop pain or pressure in the following areas:  Chest.  Jaw or neck.  Between your shoulder blades.  Radiating down your left arm.  Headache.  Dizziness or light-headedness.  Shortness of breath.  Increased or irregular heartbeat.  Low blood pressure.  Nausea or vomiting.  Flushing.  Redness going up the arm and slight pain during injection of medicine.  Heart attack (rare). BEFORE THE PROCEDURE  Avoid all forms of caffeine for 24 hours before your test or as directed by your health care provider. This includes coffee, tea (even decaffeinated tea), caffeinated sodas, chocolate, cocoa, and certain pain medicines.  Follow your health care provider's instructions regarding eating and drinking before the test.  Take your medicines as directed at regular times with water unless instructed otherwise. Exceptions may include:  If you have diabetes, ask how you are to take your insulin or pills. It is common to adjust insulin dosing the morning of the test.  If you are taking beta-blocker medicines, it is important to talk to your health care provider about these medicines well before the date of your test. Taking beta-blocker medicines may interfere with the test. In some cases, these medicines need to be changed or stopped 24 hours or more before the test.  If you wear a nitroglycerin patch, it may need to be removed prior to the test. Ask your health care provider if the patch should be removed before the test.  If you use an inhaler for any breathing condition, bring it with you to the test.  If you are an  outpatient, bring a snack so you can eat right after the stress phase of the test.  Do not smoke for 4 hours prior to the test or as directed by your health care provider.  Do not apply lotions, powders, creams, or oils on your chest prior to the test.  Wear comfortable shoes and clothing. Let your health care provider know if you were unable to complete or follow the preparations for your test. PROCEDURE  Multiple patches (electrodes) will be put on your chest. If needed, small areas of your chest may be shaved to get better contact with the electrodes. Once the electrodes are attached to your body, multiple wires will be attached to the electrodes, and your heart rate will be monitored.  An IV access will be started. A nuclear trace (isotope) is given. The isotope may be given intravenously, or it may be swallowed. Nuclear refers to several types of radioactive isotopes, and the nuclear isotope lights up the arteries so that the nuclear images are clear. The isotope is absorbed by your body. This results in low radiation exposure.  A resting nuclear image is taken to show how your heart functions at rest.  A medicine is given through  the IV access.  A second scan is done about 1 hour after the medicine injection and determines how your heart functions under stress.  During this stress phase, you will be connected to an electrocardiogram machine. Your blood pressure and oxygen levels will be monitored. What to expect after the procedure  Your heart rate and blood pressure will be monitored after the test.  You may return to your normal schedule, including diet,activities, and medicines, unless your health care provider tells you otherwise. This information is not intended to replace advice given to you by your health care provider. Make sure you discuss any questions you have with your health care provider. Document Released: 06/20/2008 Document Revised: 07/10/2015 Document Reviewed:  08/11/2015 Elsevier Interactive Patient Education  2017 [employer].

## 2016-05-26 ENCOUNTER — Ambulatory Visit
Admission: RE | Admit: 2016-05-26 | Discharge: 2016-05-26 | Disposition: A | Discharge status: Home / Self Care | Payer: PPO | Source: Ambulatory Visit | Attending: Cardiovascular Disease | Admitting: Cardiovascular Disease

## 2016-05-26 DIAGNOSIS — I739 Peripheral vascular disease, unspecified: Secondary | ICD-10-CM | POA: Diagnosis not present

## 2016-05-26 DIAGNOSIS — I209 Angina pectoris, unspecified: Secondary | ICD-10-CM | POA: Diagnosis not present

## 2016-05-26 DIAGNOSIS — J438 Other emphysema: Secondary | ICD-10-CM

## 2016-05-26 DIAGNOSIS — E782 Mixed hyperlipidemia: Secondary | ICD-10-CM

## 2016-05-26 DIAGNOSIS — I700 Atherosclerosis of aorta: Secondary | ICD-10-CM

## 2016-05-26 DIAGNOSIS — I998 Other disorder of circulatory system: Secondary | ICD-10-CM | POA: Diagnosis not present

## 2016-05-26 DIAGNOSIS — C3412 Malignant neoplasm of upper lobe, left bronchus or lung: Secondary | ICD-10-CM | POA: Diagnosis not present

## 2016-05-26 DIAGNOSIS — R0789 Other chest pain: Secondary | ICD-10-CM | POA: Diagnosis not present

## 2016-05-26 DIAGNOSIS — I7090 Unspecified atherosclerosis: Secondary | ICD-10-CM

## 2016-05-26 DIAGNOSIS — R0602 Shortness of breath: Secondary | ICD-10-CM

## 2016-05-26 DIAGNOSIS — I259 Chronic ischemic heart disease, unspecified: Secondary | ICD-10-CM

## 2016-05-26 LAB — NM MYOCAR MULTI W/SPECT W/WALL MOTION / EF
CHL CUP NUCLEAR SDS: 1
CHL CUP NUCLEAR SRS: 1
CHL CUP NUCLEAR SSS: 2
CHL CUP RESTING HR STRESS: 67 {beats}/min
CSEPHR: 64 %
CSEPPHR: 90 {beats}/min
LV dias vol: 19 mL (ref 46–106)
LVSYSVOL: 3 mL
TID: 1.08

## 2016-05-26 MED ORDER — TECHNETIUM TC 99M TETROFOSMIN IV KIT
13.2230 | PACK | Freq: Once | INTRAVENOUS | Status: AC | PRN
  Administered 2016-05-26: 13.223 via INTRAVENOUS

## 2016-05-26 MED ORDER — TECHNETIUM TC 99M TETROFOSMIN IV KIT
32.0500 | PACK | Freq: Once | INTRAVENOUS | Status: AC | PRN
  Administered 2016-05-26: 32.05 via INTRAVENOUS

## 2016-05-26 MED ORDER — REGADENOSON 0.4 MG/5ML IV SOLN
0.4000 mg | Freq: Once | INTRAVENOUS | Status: AC
  Administered 2016-05-26: 0.4 mg via INTRAVENOUS
  Filled 2016-05-26: qty 5

## 2016-06-03 DIAGNOSIS — J449 Chronic obstructive pulmonary disease, unspecified: Secondary | ICD-10-CM | POA: Diagnosis not present

## 2016-07-03 DIAGNOSIS — J449 Chronic obstructive pulmonary disease, unspecified: Secondary | ICD-10-CM | POA: Diagnosis not present

## 2016-07-27 ENCOUNTER — Other Ambulatory Visit: Payer: Self-pay | Admitting: Internal Medicine

## 2016-07-27 DIAGNOSIS — Z1231 Encounter for screening mammogram for malignant neoplasm of breast: Secondary | ICD-10-CM

## 2016-08-03 DIAGNOSIS — J449 Chronic obstructive pulmonary disease, unspecified: Secondary | ICD-10-CM | POA: Diagnosis not present

## 2016-08-24 DIAGNOSIS — E538 Deficiency of other specified B group vitamins: Secondary | ICD-10-CM | POA: Diagnosis not present

## 2016-08-24 DIAGNOSIS — F419 Anxiety disorder, unspecified: Secondary | ICD-10-CM | POA: Diagnosis not present

## 2016-08-24 DIAGNOSIS — Z0189 Encounter for other specified special examinations: Secondary | ICD-10-CM | POA: Diagnosis not present

## 2016-08-24 DIAGNOSIS — I2510 Atherosclerotic heart disease of native coronary artery without angina pectoris: Secondary | ICD-10-CM | POA: Diagnosis not present

## 2016-08-24 DIAGNOSIS — J449 Chronic obstructive pulmonary disease, unspecified: Secondary | ICD-10-CM | POA: Diagnosis not present

## 2016-08-24 DIAGNOSIS — J431 Panlobular emphysema: Secondary | ICD-10-CM | POA: Diagnosis not present

## 2016-08-24 DIAGNOSIS — C3490 Malignant neoplasm of unspecified part of unspecified bronchus or lung: Secondary | ICD-10-CM | POA: Diagnosis not present

## 2016-08-24 DIAGNOSIS — Z79899 Other long term (current) drug therapy: Secondary | ICD-10-CM | POA: Diagnosis not present

## 2016-08-24 DIAGNOSIS — I700 Atherosclerosis of aorta: Secondary | ICD-10-CM | POA: Diagnosis not present

## 2016-08-24 DIAGNOSIS — F329 Major depressive disorder, single episode, unspecified: Secondary | ICD-10-CM | POA: Diagnosis not present

## 2016-08-24 DIAGNOSIS — Z1329 Encounter for screening for other suspected endocrine disorder: Secondary | ICD-10-CM | POA: Diagnosis not present

## 2016-08-25 DIAGNOSIS — R8271 Bacteriuria: Secondary | ICD-10-CM | POA: Diagnosis not present

## 2016-08-25 DIAGNOSIS — N390 Urinary tract infection, site not specified: Secondary | ICD-10-CM | POA: Diagnosis not present

## 2016-09-02 DIAGNOSIS — J449 Chronic obstructive pulmonary disease, unspecified: Secondary | ICD-10-CM | POA: Diagnosis not present

## 2016-09-13 ENCOUNTER — Ambulatory Visit
Admission: RE | Admit: 2016-09-13 | Discharge: 2016-09-13 | Disposition: A | Discharge status: Home / Self Care | Payer: PPO | Source: Ambulatory Visit | Attending: Internal Medicine | Admitting: Internal Medicine

## 2016-09-13 DIAGNOSIS — Z1231 Encounter for screening mammogram for malignant neoplasm of breast: Secondary | ICD-10-CM | POA: Insufficient documentation

## 2016-09-13 HISTORY — DX: Personal history of antineoplastic chemotherapy: Z92.21

## 2016-10-03 DIAGNOSIS — J449 Chronic obstructive pulmonary disease, unspecified: Secondary | ICD-10-CM | POA: Diagnosis not present

## 2016-11-03 DIAGNOSIS — J449 Chronic obstructive pulmonary disease, unspecified: Secondary | ICD-10-CM | POA: Diagnosis not present

## 2016-11-04 DIAGNOSIS — F5104 Psychophysiologic insomnia: Secondary | ICD-10-CM | POA: Diagnosis not present

## 2016-11-04 DIAGNOSIS — I2510 Atherosclerotic heart disease of native coronary artery without angina pectoris: Secondary | ICD-10-CM | POA: Diagnosis not present

## 2016-11-04 DIAGNOSIS — I739 Peripheral vascular disease, unspecified: Secondary | ICD-10-CM | POA: Diagnosis not present

## 2016-11-04 DIAGNOSIS — N390 Urinary tract infection, site not specified: Secondary | ICD-10-CM | POA: Diagnosis not present

## 2016-11-04 DIAGNOSIS — E7800 Pure hypercholesterolemia, unspecified: Secondary | ICD-10-CM | POA: Diagnosis not present

## 2016-11-04 DIAGNOSIS — F419 Anxiety disorder, unspecified: Secondary | ICD-10-CM | POA: Diagnosis not present

## 2016-11-04 DIAGNOSIS — F329 Major depressive disorder, single episode, unspecified: Secondary | ICD-10-CM | POA: Diagnosis not present

## 2016-11-04 DIAGNOSIS — Z0000 Encounter for general adult medical examination without abnormal findings: Secondary | ICD-10-CM | POA: Diagnosis not present

## 2016-11-04 DIAGNOSIS — Z23 Encounter for immunization: Secondary | ICD-10-CM | POA: Diagnosis not present

## 2016-11-04 DIAGNOSIS — J431 Panlobular emphysema: Secondary | ICD-10-CM | POA: Diagnosis not present

## 2016-12-03 DIAGNOSIS — J449 Chronic obstructive pulmonary disease, unspecified: Secondary | ICD-10-CM | POA: Diagnosis not present

## 2016-12-07 DIAGNOSIS — R509 Fever, unspecified: Secondary | ICD-10-CM | POA: Diagnosis not present

## 2016-12-07 DIAGNOSIS — Z87440 Personal history of urinary (tract) infections: Secondary | ICD-10-CM | POA: Diagnosis not present

## 2016-12-07 DIAGNOSIS — J431 Panlobular emphysema: Secondary | ICD-10-CM | POA: Diagnosis not present

## 2016-12-21 NOTE — Progress Notes (Signed)
12/22/2016 10:32 PM   [name] Jun 29, 1933 [id number]  Referring provider: Idelle Crouch, MD [facility] [place], [place] [address]  Chief Complaint  Patient presents with  . Urinary Tract Infection    HPI: Patient is a 83 -year-old Caucasian female who is referred to [place] by, [name], PA - C, for recurrent urinary tract infections.  Patient states that she has had three urinary tract infections over the last year.  Reviewing her records,  she has had two positive urine cultures in the past 6 months.   Her symptoms with a urinary tract infection consist of dysuria, frequency, chills and night sweats.  She completed Cipro ~ 3 weeks ago.    Her baseline urinary symptoms consist of nocturia, incontinence and hesitancy.  She denies gross hematuria, suprapubic pain, back pain, abdominal pain or flank pain.  She has not had any recent nausea or vomiting.  She does not have a history of nephrolithiasis, GU surgery or GU trauma.  Her UA today is negative.  Her PVR is 25 mL.    She is not sexually active.  She is postmenopausal.   She admits to constipation.  She does engage in good perineal hygiene. She does not take tub baths.   She has incontinence.  She is using incontinence pads, one pull up daily.    She is not having pain with bladder filling.    She has not had any recent imaging studies.    She is drinking 32 ounces of water daily.  One Dr. Malachi Bonds daily.  One glass of milk daily.  Two or three cups of coffee daily.     Reviewed referral notes.    - see above  PMH: Past Medical History:  Diagnosis Date  . Cancer ([place])    lung, chemo  . GERD (gastroesophageal reflux disease)   . Hyperlipidemia   . Lung disease   . Ovarian cyst 07/30/2014  . Past heart attack    "stress"- per pt has had 2.  . Personal history of chemotherapy   . Shortness of breath     Surgical History: Past Surgical History:  Procedure Laterality Date  .  BREAST BIOPSY Left 11-2012   neg., papilloma  . CHOLECYSTECTOMY  2014  . LUNG SURGERY  2007   upper left lobe  . TONSILLECTOMY      Home Medications:  Allergies as of 12/22/2016      Reactions   Morphine And Related Other (See Comments)   halluncinations   Sulfonamide Derivatives Itching   Tetanus Toxoid Itching, Swelling      Medication List        Accurate as of 12/22/16 11:59 PM. Always use your most recent med list.          ADVAIR DISKUS 250-50 MCG/DOSE Aepb Generic drug:  Fluticasone-Salmeterol INHALE 1 PUFF TWICE DAILY.  RINSE MOUTH AFTER EACH USE   aspirin 81 MG tablet Take 81 mg by mouth daily.   atorvastatin 20 MG tablet Commonly known as:  LIPITOR Take 20 mg by mouth daily.   CALCIUM 600 + D PO Take 1 tablet by mouth daily.   CENTRUM SILVER PO Take 1 tablet daily by mouth.   cholecalciferol 1000 units tablet Commonly known as:  VITAMIN D Take 1,000 Units by mouth daily.   escitalopram 20 MG tablet Commonly known as:  LEXAPRO Take 1 tablet by mouth daily.   LORazepam 1 MG tablet Commonly known as:  ATIVAN Take 0.5 mg by mouth at bedtime.   megestrol 400 MG/10ML suspension Commonly known as:  MEGACE Take 400 mg daily by mouth.   mirtazapine 30 MG tablet Commonly known as:  REMERON Take 1 tablet by mouth daily.   omeprazole 40 MG capsule Commonly known as:  PRILOSEC Take 1 capsule by mouth daily.   OXYGEN Inhale 2 L into the lungs at bedtime.   senna-docusate 8.6-50 MG tablet Commonly known as:  Senokot-S Take 1 tablet daily by mouth.   SPIRIVA HANDIHALER 18 MCG inhalation capsule Generic drug:  tiotropium Place 18 mcg into inhaler and inhale daily.       Allergies:  Allergies  Allergen Reactions  . Morphine And Related Other (See Comments)    halluncinations  . Sulfonamide Derivatives Itching  . Tetanus Toxoid Itching and Swelling    Family History: Family History  Problem Relation Age of Onset  . Cancer Daughter         breast  . Breast cancer Daughter 87  . Aneurysm Mother        brain  . Hyperlipidemia Mother   . Thyroid disease Mother   . Heart failure Father   . Hyperlipidemia Father   . Scleroderma Sister   . COPD Sister   . Hyperlipidemia Sister   . Thyroid disease Sister   . Hyperlipidemia Brother   . Heart attack Brother   . Healthy Sister   . Alcohol abuse Brother     Social History:  reports that she quit smoking about 15 years ago. Her smoking use included cigarettes. She has a 60.00 pack-year smoking history. She has quit using smokeless tobacco. She reports that she drinks alcohol. She reports that she does not use drugs.  ROS: UROLOGY Frequent Urination?: No Hard to postpone urination?: No Burning/pain with urination?: No Get up at night to urinate?: Yes Leakage of urine?: Yes Urine stream starts and stops?: No Trouble starting stream?: Yes Do you have to strain to urinate?: No Blood in urine?: No Urinary tract infection?: Yes Sexually transmitted disease?: No Injury to kidneys or bladder?: No Painful intercourse?: No Weak stream?: No Currently pregnant?: No Vaginal bleeding?: No  Gastrointestinal Nausea?: No Vomiting?: No Indigestion/heartburn?: No Diarrhea?: No Constipation?: Yes  Constitutional Fever: Yes Night sweats?: Yes Weight loss?: Yes Fatigue?: Yes  Skin Skin rash/lesions?: No Itching?: Yes  Eyes Blurred vision?: No Double vision?: No  Ears/Nose/Throat Sore throat?: No Sinus problems?: No  Hematologic/Lymphatic Swollen glands?: No Easy bruising?: Yes  Cardiovascular Leg swelling?: No Chest pain?: No  Respiratory Cough?: No Shortness of breath?: No  Endocrine Excessive thirst?: No  Musculoskeletal Back pain?: Yes Joint pain?: No  Neurological Headaches?: No Dizziness?: No  Psychologic Depression?: No Anxiety?: Yes  Physical Exam: BP 130/78   Pulse 89   Ht '5\' 3"'  (1.6 m)   Wt 105 lb 3.2 oz (47.7 kg)   SpO2 95%  Comment: 2L via [place]  BMI 18.64 kg/m   Constitutional: Well nourished. Alert and oriented, No acute distress. HEENT: [place] AT, moist mucus membranes. Trachea midline, no masses. Cardiovascular: No clubbing, cyanosis, or edema. Respiratory: Normal respiratory effort, no increased work of breathing. GI: Abdomen is soft, non tender, non distended, no abdominal masses. Liver and spleen not palpable.  No hernias appreciated.  Stool sample for occult testing is not indicated.   GU: No CVA tenderness.  No bladder fullness or masses.  Atrophic external genitalia, normal pubic hair distribution, no lesions.  Urethral caruncle present.  No  bladder fullness, tenderness or masses. Pale vagina mucosa, poor estrogen effect, no discharge, no lesions, good pelvic support, Grade II cystocele.  No rectocele noted.  No cervical motion tenderness.  Uterus is freely mobile and non-fixed.  No adnexal/parametria masses or tenderness noted.  Anus and perineum are without rashes or lesions.    Skin: No rashes, bruises or suspicious lesions. Lymph: No cervical or inguinal adenopathy. Neurologic: Grossly intact, no focal deficits, moving all 4 extremities. Psychiatric: Normal mood and affect.  Laboratory Data: Urinalysis Negative.  See Epic.   I have reviewed the labs.   Pertinent Imaging: Results for SEASON, ASTACIO (MRN [id number]) as of 12/22/2016 14:48  Ref. Range 12/22/2016 14:33  Scan Result Unknown 25 ml    Assessment & Plan:    1. Recurrent UTI's  - criteria for recurrent UTI has been met with 2 or more infections in 6 months or 3 or greater infections in one year   - Patient is instructed to increase their water intake until the urine is pale yellow or clear (10 to 12 cups daily)   - probiotics (yogurt, oral pills or vaginal suppositories), take cranberry pills or drink the juice and Vitamin C 1,000 mg daily to acidify the urine should be added to their daily regimen   - avoid soaking in tubs and wipe  front to back after urinating   - benefit from core strengthening exercises has been seen.  We can refer her to PT if they desire   - advised them to have CATH UA's for urinalysis and culture to prevent skin contamination of the specimen  - reviewed symptoms of UTI and advised not to have urine checked or be treated for UTI if not experiencing symptoms  - discussed antibiotic stewardship with the patient    - UA  - urine culture as patient is anxious about whether or not her infection has cleared  - RUS to evaluate for possible nidus of infection  2. Vaginal atrophy  - I explained to the patient that when women go through menopause and her estrogen levels are severely diminished, the normal vaginal flora will change.  This is due to an increase of the vaginal canal's pH. Because of this, the vaginal canal may be colonized by bacteria from the rectum instead of the protective lactobacillus.  This accompanied by the loss of the mucus barrier with vaginal atrophy is a cause of recurrent urinary tract infections.  - not a candidate for vaginal estrogen due to history of breast cancer  3. Cystocele  - PVR is minimal - not a nidus for infectoin                                              Return for pending RUS resutls.  These notes generated with voice recognition software. I apologize for typographical errors.  Zara Council, [facility] [address], [place], [place] [address] [phone]

## 2016-12-22 ENCOUNTER — Encounter: Payer: Self-pay | Admitting: Urology

## 2016-12-22 ENCOUNTER — Ambulatory Visit: Payer: PPO | Admitting: Urology

## 2016-12-22 VITALS — BP 130/78 | HR 89 | Ht 63.0 in | Wt 105.2 lb

## 2016-12-22 DIAGNOSIS — N952 Postmenopausal atrophic vaginitis: Secondary | ICD-10-CM

## 2016-12-22 DIAGNOSIS — N390 Urinary tract infection, site not specified: Secondary | ICD-10-CM | POA: Diagnosis not present

## 2016-12-22 DIAGNOSIS — N8111 Cystocele, midline: Secondary | ICD-10-CM | POA: Diagnosis not present

## 2016-12-22 LAB — URINALYSIS, COMPLETE
BILIRUBIN UA: NEGATIVE
GLUCOSE, UA: NEGATIVE
LEUKOCYTES UA: NEGATIVE
Nitrite, UA: NEGATIVE
Urobilinogen, Ur: 1 mg/dL (ref 0.2–1.0)
pH, UA: 5 (ref 5.0–7.5)

## 2016-12-22 LAB — MICROSCOPIC EXAMINATION
Epithelial Cells (non renal): NONE SEEN /hpf (ref 0–10)
RBC, UA: NONE SEEN /hpf (ref 0–?)
WBC, UA: NONE SEEN /hpf (ref 0–?)

## 2016-12-22 LAB — BLADDER SCAN AMB NON-IMAGING

## 2016-12-22 NOTE — Patient Instructions (Signed)

## 2016-12-25 LAB — CULTURE, URINE COMPREHENSIVE

## 2016-12-29 ENCOUNTER — Ambulatory Visit
Admission: RE | Admit: 2016-12-29 | Discharge: 2016-12-29 | Disposition: A | Discharge status: Home / Self Care | Payer: PPO | Source: Ambulatory Visit | Attending: Urology | Admitting: Urology

## 2016-12-29 DIAGNOSIS — R93421 Abnormal radiologic findings on diagnostic imaging of right kidney: Secondary | ICD-10-CM | POA: Diagnosis not present

## 2016-12-29 DIAGNOSIS — N390 Urinary tract infection, site not specified: Secondary | ICD-10-CM | POA: Diagnosis not present

## 2016-12-29 DIAGNOSIS — R93422 Abnormal radiologic findings on diagnostic imaging of left kidney: Secondary | ICD-10-CM | POA: Diagnosis not present

## 2017-01-03 DIAGNOSIS — J449 Chronic obstructive pulmonary disease, unspecified: Secondary | ICD-10-CM | POA: Diagnosis not present

## 2017-01-04 ENCOUNTER — Telehealth: Payer: Self-pay

## 2017-01-04 NOTE — Telephone Encounter (Signed)
Will send a letter

## 2017-01-04 NOTE — Telephone Encounter (Signed)
-----   Message from Nori Riis, PA-C sent at 01/03/2017 10:32 PM EST ----- Please let Mrs. Zech know that her RUS was normal.  There was no swollen kidneys or stones.  She should follow up in three months.  Sooner if she should have another UTI.

## 2017-02-02 DIAGNOSIS — J449 Chronic obstructive pulmonary disease, unspecified: Secondary | ICD-10-CM | POA: Diagnosis not present

## 2017-02-22 ENCOUNTER — Other Ambulatory Visit: Payer: Self-pay

## 2017-02-22 ENCOUNTER — Emergency Department: Payer: PPO

## 2017-02-22 ENCOUNTER — Inpatient Hospital Stay
Admission: EM | Admit: 2017-02-22 | Discharge: 2017-02-24 | DRG: 871 | Disposition: A | Discharge status: Home Health | Payer: PPO | Attending: Internal Medicine | Admitting: Internal Medicine

## 2017-02-22 DIAGNOSIS — F329 Major depressive disorder, single episode, unspecified: Secondary | ICD-10-CM | POA: Diagnosis present

## 2017-02-22 DIAGNOSIS — J440 Chronic obstructive pulmonary disease with acute lower respiratory infection: Secondary | ICD-10-CM | POA: Diagnosis present

## 2017-02-22 DIAGNOSIS — J189 Pneumonia, unspecified organism: Secondary | ICD-10-CM | POA: Diagnosis present

## 2017-02-22 DIAGNOSIS — Z9981 Dependence on supplemental oxygen: Secondary | ICD-10-CM

## 2017-02-22 DIAGNOSIS — R748 Abnormal levels of other serum enzymes: Secondary | ICD-10-CM | POA: Diagnosis not present

## 2017-02-22 DIAGNOSIS — Z882 Allergy status to sulfonamides status: Secondary | ICD-10-CM

## 2017-02-22 DIAGNOSIS — Z85118 Personal history of other malignant neoplasm of bronchus and lung: Secondary | ICD-10-CM

## 2017-02-22 DIAGNOSIS — J441 Chronic obstructive pulmonary disease with (acute) exacerbation: Secondary | ICD-10-CM | POA: Diagnosis present

## 2017-02-22 DIAGNOSIS — Z887 Allergy status to serum and vaccine status: Secondary | ICD-10-CM | POA: Diagnosis not present

## 2017-02-22 DIAGNOSIS — Z803 Family history of malignant neoplasm of breast: Secondary | ICD-10-CM

## 2017-02-22 DIAGNOSIS — R0789 Other chest pain: Secondary | ICD-10-CM | POA: Diagnosis not present

## 2017-02-22 DIAGNOSIS — Z885 Allergy status to narcotic agent status: Secondary | ICD-10-CM

## 2017-02-22 DIAGNOSIS — I700 Atherosclerosis of aorta: Secondary | ICD-10-CM | POA: Diagnosis not present

## 2017-02-22 DIAGNOSIS — Z9049 Acquired absence of other specified parts of digestive tract: Secondary | ICD-10-CM | POA: Diagnosis not present

## 2017-02-22 DIAGNOSIS — Z79899 Other long term (current) drug therapy: Secondary | ICD-10-CM | POA: Diagnosis not present

## 2017-02-22 DIAGNOSIS — Z7982 Long term (current) use of aspirin: Secondary | ICD-10-CM

## 2017-02-22 DIAGNOSIS — J9611 Chronic respiratory failure with hypoxia: Secondary | ICD-10-CM | POA: Diagnosis not present

## 2017-02-22 DIAGNOSIS — I248 Other forms of acute ischemic heart disease: Secondary | ICD-10-CM | POA: Diagnosis present

## 2017-02-22 DIAGNOSIS — E876 Hypokalemia: Secondary | ICD-10-CM | POA: Diagnosis present

## 2017-02-22 DIAGNOSIS — Z9221 Personal history of antineoplastic chemotherapy: Secondary | ICD-10-CM

## 2017-02-22 DIAGNOSIS — E785 Hyperlipidemia, unspecified: Secondary | ICD-10-CM | POA: Diagnosis present

## 2017-02-22 DIAGNOSIS — R05 Cough: Secondary | ICD-10-CM | POA: Diagnosis not present

## 2017-02-22 DIAGNOSIS — I959 Hypotension, unspecified: Secondary | ICD-10-CM | POA: Diagnosis not present

## 2017-02-22 DIAGNOSIS — E871 Hypo-osmolality and hyponatremia: Secondary | ICD-10-CM | POA: Diagnosis not present

## 2017-02-22 DIAGNOSIS — A419 Sepsis, unspecified organism: Secondary | ICD-10-CM | POA: Diagnosis not present

## 2017-02-22 DIAGNOSIS — R1011 Right upper quadrant pain: Secondary | ICD-10-CM | POA: Diagnosis not present

## 2017-02-22 DIAGNOSIS — R140 Abdominal distension (gaseous): Secondary | ICD-10-CM | POA: Diagnosis present

## 2017-02-22 DIAGNOSIS — Z720 Tobacco use: Secondary | ICD-10-CM | POA: Diagnosis not present

## 2017-02-22 DIAGNOSIS — Z902 Acquired absence of lung [part of]: Secondary | ICD-10-CM | POA: Diagnosis not present

## 2017-02-22 DIAGNOSIS — F419 Anxiety disorder, unspecified: Secondary | ICD-10-CM | POA: Diagnosis present

## 2017-02-22 DIAGNOSIS — J9621 Acute and chronic respiratory failure with hypoxia: Secondary | ICD-10-CM | POA: Diagnosis not present

## 2017-02-22 DIAGNOSIS — Z87891 Personal history of nicotine dependence: Secondary | ICD-10-CM | POA: Diagnosis not present

## 2017-02-22 LAB — CBC
HEMATOCRIT: 31.3 % — AB (ref 35.0–47.0)
Hemoglobin: 10.5 g/dL — ABNORMAL LOW (ref 12.0–16.0)
MCH: 32.3 pg (ref 26.0–34.0)
MCHC: 33.5 g/dL (ref 32.0–36.0)
MCV: 96.3 fL (ref 80.0–100.0)
Platelets: 294 10*3/uL (ref 150–440)
RBC: 3.25 MIL/uL — AB (ref 3.80–5.20)
RDW: 14 % (ref 11.5–14.5)
WBC: 16.3 10*3/uL — ABNORMAL HIGH (ref 3.6–11.0)

## 2017-02-22 LAB — URINALYSIS, COMPLETE (UACMP) WITH MICROSCOPIC
Bilirubin Urine: NEGATIVE
GLUCOSE, UA: NEGATIVE mg/dL
KETONES UR: 5 mg/dL — AB
NITRITE: NEGATIVE
PROTEIN: NEGATIVE mg/dL
Specific Gravity, Urine: 1.01 (ref 1.005–1.030)
pH: 5 (ref 5.0–8.0)

## 2017-02-22 LAB — COMPREHENSIVE METABOLIC PANEL
ALBUMIN: 2.7 g/dL — AB (ref 3.5–5.0)
ALT: 19 U/L (ref 14–54)
ANION GAP: 8 (ref 5–15)
AST: 25 U/L (ref 15–41)
Alkaline Phosphatase: 73 U/L (ref 38–126)
BUN: 26 mg/dL — ABNORMAL HIGH (ref 6–20)
CO2: 27 mmol/L (ref 22–32)
Calcium: 8.5 mg/dL — ABNORMAL LOW (ref 8.9–10.3)
Chloride: 95 mmol/L — ABNORMAL LOW (ref 101–111)
Creatinine, Ser: 0.72 mg/dL (ref 0.44–1.00)
GFR calc non Af Amer: >60 mL/min (ref >60)
GLUCOSE: 80 mg/dL (ref 65–99)
POTASSIUM: 3.9 mmol/L (ref 3.5–5.1)
SODIUM: 130 mmol/L — AB (ref 135–145)
TOTAL PROTEIN: 6 g/dL — AB (ref 6.5–8.1)
Total Bilirubin: 0.7 mg/dL (ref 0.3–1.2)

## 2017-02-22 LAB — LACTIC ACID, PLASMA
LACTIC ACID, VENOUS: 1.1 mmol/L (ref 0.5–1.9)
LACTIC ACID, VENOUS: 1.3 mmol/L (ref 0.5–1.9)

## 2017-02-22 LAB — BRAIN NATRIURETIC PEPTIDE: B Natriuretic Peptide: 187 pg/mL — ABNORMAL HIGH (ref 0.0–100.0)

## 2017-02-22 LAB — PROTIME-INR
INR: 1.23
PROTHROMBIN TIME: 15.4 s — AB (ref 11.4–15.2)

## 2017-02-22 LAB — TROPONIN I: Troponin I: 0.35 ng/mL (ref <0.03)

## 2017-02-22 LAB — LIPASE, BLOOD: Lipase: 19 U/L (ref 11–51)

## 2017-02-22 MED ORDER — ALBUTEROL SULFATE (2.5 MG/3ML) 0.083% IN NEBU: 2.5000 mg | INHALATION_SOLUTION | Freq: Four times a day (QID) | RESPIRATORY_TRACT | Status: DC | PRN

## 2017-02-22 MED ORDER — PANTOPRAZOLE SODIUM 40 MG PO TBEC
40.0000 mg | DELAYED_RELEASE_TABLET | Freq: Every day | ORAL | Status: DC
  Administered 2017-02-24: 40 mg via ORAL
  Filled 2017-02-22 (×2): qty 1

## 2017-02-22 MED ORDER — SODIUM CHLORIDE 0.9 % IV SOLN
INTRAVENOUS | Status: DC
  Administered 2017-02-22: 22:00:00 via INTRAVENOUS

## 2017-02-22 MED ORDER — DOCUSATE SODIUM 100 MG PO CAPS: 100.0000 mg | ORAL_CAPSULE | Freq: Two times a day (BID) | ORAL | Status: DC | PRN

## 2017-02-22 MED ORDER — ASPIRIN EC 81 MG PO TBEC
81.0000 mg | DELAYED_RELEASE_TABLET | Freq: Every day | ORAL | Status: DC
  Administered 2017-02-22 – 2017-02-24 (×3): 81 mg via ORAL
  Filled 2017-02-22 (×3): qty 1

## 2017-02-22 MED ORDER — SODIUM CHLORIDE 0.9 % IV BOLUS (SEPSIS)
500.0000 mL | Freq: Once | INTRAVENOUS | Status: AC
  Administered 2017-02-22: 500 mL via INTRAVENOUS

## 2017-02-22 MED ORDER — ESCITALOPRAM OXALATE 10 MG PO TABS
10.0000 mg | ORAL_TABLET | Freq: Every day | ORAL | Status: DC
  Administered 2017-02-22 – 2017-02-24 (×3): 10 mg via ORAL
  Filled 2017-02-22 (×3): qty 1

## 2017-02-22 MED ORDER — ENOXAPARIN SODIUM 40 MG/0.4ML ~~LOC~~ SOLN
40.0000 mg | SUBCUTANEOUS | Status: DC
  Administered 2017-02-22 – 2017-02-23 (×2): 40 mg via SUBCUTANEOUS
  Filled 2017-02-22 (×2): qty 0.4

## 2017-02-22 MED ORDER — MOMETASONE FURO-FORMOTEROL FUM 200-5 MCG/ACT IN AERO
2.0000 | INHALATION_SPRAY | Freq: Two times a day (BID) | RESPIRATORY_TRACT | Status: DC
  Administered 2017-02-22 – 2017-02-24 (×3): 2 via RESPIRATORY_TRACT
  Filled 2017-02-22 (×2): qty 8.8

## 2017-02-22 MED ORDER — CEFTRIAXONE SODIUM IN DEXTROSE 20 MG/ML IV SOLN
1.0000 g | Freq: Once | INTRAVENOUS | Status: AC
  Administered 2017-02-22: 1 g via INTRAVENOUS
  Filled 2017-02-22: qty 50

## 2017-02-22 MED ORDER — DEXTROSE 5 % IV SOLN
500.0000 mg | INTRAVENOUS | Status: DC
  Administered 2017-02-23: 23:00:00 500 mg via INTRAVENOUS
  Filled 2017-02-22 (×3): qty 500

## 2017-02-22 MED ORDER — TIOTROPIUM BROMIDE MONOHYDRATE 18 MCG IN CAPS
18.0000 ug | ORAL_CAPSULE | Freq: Every day | RESPIRATORY_TRACT | Status: DC
  Administered 2017-02-22 – 2017-02-23 (×2): 18 ug via RESPIRATORY_TRACT
  Filled 2017-02-22: qty 5

## 2017-02-22 MED ORDER — ALUM & MAG HYDROXIDE-SIMETH 200-200-20 MG/5ML PO SUSP: 30.0000 mL | Freq: Four times a day (QID) | ORAL | Status: DC | PRN

## 2017-02-22 MED ORDER — ACETAMINOPHEN 325 MG PO TABS
650.0000 mg | ORAL_TABLET | Freq: Four times a day (QID) | ORAL | Status: DC | PRN
Start: 2017-02-22 — End: 2017-02-24

## 2017-02-22 MED ORDER — ONDANSETRON HCL 4 MG/2ML IJ SOLN: 4.0000 mg | Freq: Four times a day (QID) | INTRAMUSCULAR | Status: DC | PRN

## 2017-02-22 MED ORDER — ADULT MULTIVITAMIN W/MINERALS CH
1.0000 | ORAL_TABLET | Freq: Every day | ORAL | Status: DC
  Administered 2017-02-22 – 2017-02-24 (×3): 1 via ORAL
  Filled 2017-02-22 (×3): qty 1

## 2017-02-22 MED ORDER — MEGESTROL ACETATE 400 MG/10ML PO SUSP
400.0000 mg | Freq: Every day | ORAL | Status: DC
  Administered 2017-02-22 – 2017-02-24 (×3): 400 mg via ORAL
  Filled 2017-02-22 (×3): qty 10

## 2017-02-22 MED ORDER — SENNOSIDES-DOCUSATE SODIUM 8.6-50 MG PO TABS
1.0000 | ORAL_TABLET | Freq: Every day | ORAL | Status: DC
  Administered 2017-02-23 – 2017-02-24 (×2): 1 via ORAL
  Filled 2017-02-22 (×3): qty 1

## 2017-02-22 MED ORDER — CEFTRIAXONE SODIUM 1 G IJ SOLR
1.0000 g | INTRAMUSCULAR | Status: DC
  Administered 2017-02-23: 17:00:00 1 g via INTRAVENOUS
  Filled 2017-02-22 (×2): qty 10

## 2017-02-22 MED ORDER — LORAZEPAM 0.5 MG PO TABS
0.5000 mg | ORAL_TABLET | Freq: Every day | ORAL | Status: DC
  Administered 2017-02-22: 0.5 mg via ORAL
  Filled 2017-02-22: qty 1

## 2017-02-22 MED ORDER — PANTOPRAZOLE SODIUM 40 MG PO TBEC
40.0000 mg | DELAYED_RELEASE_TABLET | Freq: Every day | ORAL | Status: DC
  Administered 2017-02-22 – 2017-02-23 (×2): 40 mg via ORAL
  Filled 2017-02-22: qty 1

## 2017-02-22 MED ORDER — MIRTAZAPINE 15 MG PO TABS
30.0000 mg | ORAL_TABLET | Freq: Every day | ORAL | Status: DC
  Administered 2017-02-22 – 2017-02-24 (×3): 30 mg via ORAL
  Filled 2017-02-22 (×3): qty 2

## 2017-02-22 MED ORDER — ATORVASTATIN CALCIUM 20 MG PO TABS
20.0000 mg | ORAL_TABLET | Freq: Every day | ORAL | Status: DC
  Administered 2017-02-22 – 2017-02-24 (×3): 20 mg via ORAL
  Filled 2017-02-22 (×3): qty 1

## 2017-02-22 MED ORDER — DEXTROSE 5 % IV SOLN
500.0000 mg | Freq: Once | INTRAVENOUS | Status: AC
  Administered 2017-02-22: 22:00:00 500 mg via INTRAVENOUS
  Filled 2017-02-22: qty 500

## 2017-02-22 MED ORDER — VITAMIN D 1000 UNITS PO TABS
1000.0000 [IU] | ORAL_TABLET | Freq: Every day | ORAL | Status: DC
  Administered 2017-02-22 – 2017-02-24 (×3): 1000 [IU] via ORAL
  Filled 2017-02-22 (×3): qty 1

## 2017-02-22 NOTE — ED Notes (Signed)
Second blood draw completed and sent at this time.

## 2017-02-22 NOTE — H&P (Signed)
[place] [facility] [place] NAME: [name]    MR#:  [id number]  DATE OF BIRTH:  06-20-1933  DATE OF ADMISSION:  02/22/2017  PRIMARY CARE PHYSICIAN: Idelle Crouch, MD   REQUESTING/REFERRING PHYSICIAN: Schuyler Amor, MD  CHIEF COMPLAINT:  Hypotension  HISTORY OF PRESENT ILLNESS:  [name]  is a 84 y.o. female with a known history of chemotherapy currently under remission lung cancer status post wedge resection, chronically lives on 2 L of oxygen is presenting to the ED with a chief complaint of cough and chest pain while coughing.  Patient denies any fever but reporting feeling bloated and gassy associated with abdominal pain which is getting better with simethicone.  Chest x-ray has revealed right-sided pneumonia.  Patient started on IV Rocephin and azithromycin and hospitalist team was called to admit the patient.  Patient's daughter is at bedside.  Patient was treated with levofloxacin for pneumonia in the past by her primary care physician  PAST MEDICAL HISTORY:   Past Medical History:  Diagnosis Date  . Cancer ([place])    lung, chemo  . GERD (gastroesophageal reflux disease)   . Hyperlipidemia   . Lung disease   . Ovarian cyst 07/30/2014  . Past heart attack    "stress"- per pt has had 2.  . Personal history of chemotherapy   . Shortness of breath     PAST SURGICAL HISTOIRY:   Past Surgical History:  Procedure Laterality Date  . BREAST BIOPSY Left 11-2012   neg., papilloma  . CHOLECYSTECTOMY  2014  . LUNG SURGERY  2007   upper left lobe  . TONSILLECTOMY      SOCIAL HISTORY:   Social History   Tobacco Use  . Smoking status: Former Smoker    Packs/day: 1.50    Years: 40.00    Pack years: 60.00    Types: Cigarettes    Last attempt to quit: 10/01/2001    Years since quitting: 15.4  . Smokeless tobacco: Former Network engineer Use Topics  . Alcohol use: Yes    Comment: socially    FAMILY HISTORY:    Family History  Problem Relation Age of Onset  . Cancer Daughter        breast  . Breast cancer Daughter 40  . Aneurysm Mother        brain  . Hyperlipidemia Mother   . Thyroid disease Mother   . Heart failure Father   . Hyperlipidemia Father   . Scleroderma Sister   . COPD Sister   . Hyperlipidemia Sister   . Thyroid disease Sister   . Hyperlipidemia Brother   . Heart attack Brother   . Healthy Sister   . Alcohol abuse Brother     DRUG ALLERGIES:   Allergies  Allergen Reactions  . Morphine And Related Other (See Comments)    halluncinations  . Sulfonamide Derivatives Itching  . Tetanus Toxoid Itching and Swelling    REVIEW OF SYSTEMS:  CONSTITUTIONAL: No fever, reporting weakness.  EYES: No blurred or double vision.  EARS, NOSE, AND THROAT: No tinnitus or ear pain.  RESPIRATORY: Reporting cough, chest pain while coughing, exertional shortness of breath, denies wheezing or hemoptysis.  CARDIOVASCULAR: No chest pain, orthopnea, edema.  GASTROINTESTINAL: No nausea, vomiting, diarrhea or abdominal pain.  GENITOURINARY: No dysuria, hematuria.  ENDOCRINE: No polyuria, nocturia,  HEMATOLOGY: No anemia, easy bruising or bleeding SKIN: No rash or lesion. MUSCULOSKELETAL: No joint pain or arthritis.  NEUROLOGIC: No tingling, numbness, weakness.  PSYCHIATRY: No anxiety or depression.   MEDICATIONS AT HOME:   Prior to Admission medications   Medication Sig Start Date End Date Taking? Authorizing Provider  ADVAIR DISKUS 250-50 MCG/DOSE AEPB INHALE 1 PUFF TWICE DAILY.  RINSE MOUTH AFTER EACH USE 03/24/15  Yes Herring, Orville Govern, NP  albuterol (PROVENTIL HFA;VENTOLIN HFA) 108 (90 Base) MCG/ACT inhaler Inhale 2 puffs into the lungs every 6 (six) hours as needed for wheezing or shortness of breath.   Yes [provider]  aspirin 81 MG tablet Take 81 mg by mouth daily.   Yes [provider]  atorvastatin (LIPITOR) 20 MG tablet Take 20 mg by mouth daily.  04/22/15   Yes [provider]  Calcium Carb-Cholecalciferol (CALCIUM 600 + D PO) Take 1 tablet by mouth daily.   Yes [provider]  cholecalciferol (VITAMIN D) 1000 UNITS tablet Take 1,000 Units by mouth daily.   Yes [provider]  escitalopram (LEXAPRO) 10 MG tablet Take 1 tablet by mouth daily. 08/20/15  Yes [provider]  LORazepam (ATIVAN) 1 MG tablet Take 0.5 mg by mouth at bedtime.  11/15/12  Yes [provider]  megestrol (MEGACE) 400 MG/10ML suspension Take 400 mg daily by mouth.   Yes [provider]  mirtazapine (REMERON) 30 MG tablet Take 1 tablet by mouth daily. 05/14/13  Yes [provider]  Multiple Vitamins-Minerals (CENTRUM SILVER PO) Take 1 tablet daily by mouth.   Yes [provider]  omeprazole (PRILOSEC) 40 MG capsule Take 1 capsule by mouth daily. 04/30/13  Yes [provider]  OXYGEN Inhale 2 L into the lungs at bedtime.    Yes [provider]  senna-docusate (SENOKOT-S) 8.6-50 MG tablet Take 1 tablet daily by mouth.   Yes [provider]  SPIRIVA HANDIHALER 18 MCG inhalation capsule Place 18 mcg into inhaler and inhale daily.  11/01/12  Yes [provider]      VITAL SIGNS:  Blood pressure 107/62, pulse 99, temperature 98.1 F (36.7 C), temperature source Oral, resp. rate (!) 38, weight 47.6 kg (105 lb), SpO2 97 %.  PHYSICAL EXAMINATION:  GENERAL:  84 y.o.-year-old patient lying in the bed with no acute distress.  EYES: Pupils equal, round, reactive to light and accommodation. No scleral icterus. Extraocular muscles intact.  HEENT: Head atraumatic, normocephalic. Oropharynx and nasopharynx clear.  NECK:  Supple, no jugular venous distention. No thyroid enlargement, no tenderness.  LUNGS: Mod breath sounds bilaterally, no wheezing, rales,rhonchi , has right-sided crepitation. No use of accessory muscles of respiration.  CARDIOVASCULAR: S1, S2 normal. No murmurs, rubs, or  gallops.  ABDOMEN: Soft, nontender, some distention bowel sounds present. EXTREMITIES: No pedal edema, cyanosis, or clubbing.  NEUROLOGIC: Cranial nerves II through XII are intact. Muscle strength 5/5 in all extremities. Sensation intact. Gait not checked.  PSYCHIATRIC: The patient is alert and oriented x 3.  SKIN: No obvious rash, lesion, or ulcer.   LABORATORY PANEL:   CBC Recent Labs  Lab 02/22/17 1743  WBC 16.3*  HGB 10.5*  HCT 31.3*  PLT 294   ------------------------------------------------------------------------------------------------------------------  Chemistries  Recent Labs  Lab 02/22/17 1743  NA 130*  K 3.9  CL 95*  CO2 27  GLUCOSE 80  BUN 26*  CREATININE 0.72  CALCIUM 8.5*  AST 25  ALT 19  ALKPHOS 73  BILITOT 0.7   ------------------------------------------------------------------------------------------------------------------  Cardiac Enzymes Recent Labs  Lab 02/22/17 1743  TROPONINI 0.35*   ------------------------------------------------------------------------------------------------------------------  RADIOLOGY:  Dg Chest 2 View  Result Date: 02/22/2017 CLINICAL DATA:  Hervey Ard right-sided chest wall pain for the past day or 2. Symptoms are worsened with movement or coughing. History of left upper lobe malignancy, coronary artery disease and previous MI EXAM: CHEST  2 VIEW COMPARISON:  Chest x-ray of May 09, 2015 FINDINGS: The lungs remain well-expanded. The interstitial markings are increased greatest at the right lung base. There is atelectasis of portions of the right middle lobe. There is a stable approximately 3 mm nodule in the left upper lobe. The heart and pulmonary vascularity are normal. There calcification in the wall of the aortic arch. The bony thorax exhibits no acute abnormality. IMPRESSION: Right middle lobe atelectasis. Probable interstitial pneumonia in the right lower lobe. This is superimposed upon chronic bronchitic changes.  Followup PA and lateral chest X-ray is recommended in 3-4 weeks following trial of antibiotic therapy to ensure resolution and exclude underlying malignancy. Thoracic aortic atherosclerosis. Electronically Signed   By: David  Martinique M.D.   On: 02/22/2017 16:56    EKG:   Orders placed or performed during the hospital encounter of 02/22/17  . ED EKG  . ED EKG  . EKG 12-Lead  . EKG 12-Lead    IMPRESSION AND PLAN:   [name]  is a 84 y.o. female with a known history of chemotherapy currently under remission lung cancer status post wedge resection, chronically lives on 2 L of oxygen is presenting to the ED with a chief complaint of cough and chest pain while coughing.  Patient denies any fever but reporting feeling bloated and gassy associated with abdominal pain which is getting better with simethicone.  Chest x-ray has revealed right-sided pneumonia.  Patient started on IV Rocephin and azithromycin  #Sepsis secondary to pneumonia Admit to [place] unit Patient meets septic criteria with leukocytosis and tachycardia at the time of admission We will get sputum culture and sensitivity IV Rocephin and azithromycin Gentle hydration with IV fluids Bronchodilator therapy as needed  #Elevated troponin-probably from demand ischemia Patient denies any chest pain while resting We will continue close telemetry monitoring Cycle cardiac biomarkers Patient was seen by Dr. Rockey Situ in the past if needed will consult  #Hyponatremia-possibly from poor p.o. Intake Hydrate with IV fluids and recheck BMP in a.m.  #History of left lung cancer status post wedge resection and chemotherapy Currently patient is under remission Follow-up with Dr. Grayland Ormond as recommended   #GERD with abdominal distention from gas  As needed simethicone  Provide PPI  DVT prophylaxis with Lovenox subcu    All the records are reviewed and case discussed with ED provider. Management plans discussed with the patient,  family and they are in agreement.  CODE STATUS: FC,daughter HCPOA  TOTAL TIME TAKING CARE OF THIS PATIENT: 43  minutes.   Note: This dictation was prepared with Dragon dictation along with smaller phrase technology. Any transcriptional errors that result from this process are unintentional.  Nicholes Mango M.D on 02/22/2017 at 6:54 PM  Between 7am to 6pm - Pager - [phone]  After 6pm go to [email or web address] - password [email or web address] [facility]  Office  [phone]  CC: Primary care physician; Idelle Crouch, MD

## 2017-02-22 NOTE — ED Triage Notes (Signed)
Pt c/o generalized abdominal pain, greater on right than left since Sunday. Pt saw Dr. Doy Hutching; states that BP was low at doctor's office.

## 2017-02-22 NOTE — ED Notes (Signed)
Date and time results received: 02/22/17 1837   Test: Troponin Critical Value: 0.35  Name of Provider Notified: Burlene Arnt

## 2017-02-22 NOTE — ED Provider Notes (Signed)
[facility] Emergency Department Provider Note  ____________________________________________   I have reviewed the triage vital signs and the nursing notes. Where available I have reviewed prior notes and, if possible and indicated, outside hospital notes.    HISTORY  Chief Complaint Abdominal Pain and Hypotension    HPI [name] is a 84 y.o. female history of lung cancer status post chemo and resection of part of at least of the left lung, history of ovarian cyst, "stress heart attacks", on 2 L home oxygen presents today with multiple complaints.  She has a right-sided chest wall pain which is worse with cough.  She has been coughing for the last several days.  She denies any fever although she has had chills and felt warm.  She has had some loose stools but no melena no bright red blood per rectum.  Patient denies any abdominal pain.  Have right-sided chest pain.  The pain is in the right chest wall.  It is sharp, no radiation, been there for "a day or 2", worse with coughing or moving, mild to moderate at this time, associated symptoms include cough, as well as loose stools, she denies any prior treatment except for increasing her home oxygen to 2.5.  Blood pressure was reported to be low at primary care physician  Creased p.o. over the last few days, patient's been drinking but not eating much does not feel like eating, to me she denies any fall.  She also has not vomited but had some nausea.  She has been drinking however.  She also states that she has had no dysuria and feels that she does not have UTI   Past Medical History:  Diagnosis Date  . Cancer ([place])    lung, chemo  . GERD (gastroesophageal reflux disease)   . Hyperlipidemia   . Lung disease   . Ovarian cyst 07/30/2014  . Past heart attack    "stress"- per pt has had 2.  . Personal history of chemotherapy   . Shortness of breath     Patient Active Problem List   Diagnosis Date Noted  .  Aortic atherosclerosis ([place]) 05/20/2016  . Pulmonary nodules/lesions, multiple 12/18/2014  . Ovarian cyst 07/30/2014  . Nipple discharge 02/15/2014  . Anxiety and depression 07/24/2013  . Arterial vascular disease 07/24/2013  . Insomnia, persistent 07/24/2013  . Acid reflux 07/24/2013  . HLD (hyperlipidemia) 07/24/2013  . Cancer of upper lobe of left lung ([place]) 07/24/2013  . Peripheral blood vessel disorder ([place]) 07/24/2013  . Papilloma of left breast 11/22/2012  . CAFL (chronic airflow limitation) ([place]) 03/09/2012  . ALLERGIC RHINITIS 06/01/2007  . EMPHYSEMA 06/01/2007  . ASTHMA 06/01/2007  . DYSPNEA 06/01/2007    Past Surgical History:  Procedure Laterality Date  . BREAST BIOPSY Left 11-2012   neg., papilloma  . CHOLECYSTECTOMY  2014  . LUNG SURGERY  2007   upper left lobe  . TONSILLECTOMY      Prior to Admission medications   Medication Sig Start Date End Date Taking? Authorizing Provider  ADVAIR DISKUS 250-50 MCG/DOSE AEPB INHALE 1 PUFF TWICE DAILY.  RINSE MOUTH AFTER EACH USE 03/24/15   Evlyn Kanner, NP  aspirin 81 MG tablet Take 81 mg by mouth daily.    [provider]  atorvastatin (LIPITOR) 20 MG tablet Take 20 mg by mouth daily.  04/22/15   [provider]  Calcium Carb-Cholecalciferol (CALCIUM 600 + D PO) Take 1 tablet by mouth daily.  [provider]  cholecalciferol (VITAMIN D) 1000 UNITS tablet Take 1,000 Units by mouth daily.    [provider]  escitalopram (LEXAPRO) 20 MG tablet Take 1 tablet by mouth daily. 08/20/15   [provider]  LORazepam (ATIVAN) 1 MG tablet Take 0.5 mg by mouth at bedtime.  11/15/12   [provider]  megestrol (MEGACE) 400 MG/10ML suspension Take 400 mg daily by mouth.    [provider]  mirtazapine (REMERON) 30 MG tablet Take 1 tablet by mouth daily. 05/14/13   [provider]  Multiple Vitamins-Minerals (CENTRUM SILVER PO) Take 1 tablet daily by mouth.     [provider]  omeprazole (PRILOSEC) 40 MG capsule Take 1 capsule by mouth daily. 04/30/13   [provider]  OXYGEN Inhale 2 L into the lungs at bedtime.     [provider]  senna-docusate (SENOKOT-S) 8.6-50 MG tablet Take 1 tablet daily by mouth.    [provider]  SPIRIVA HANDIHALER 18 MCG inhalation capsule Place 18 mcg into inhaler and inhale daily.  11/01/12   [provider]    Allergies Morphine and related; Sulfonamide derivatives; and Tetanus toxoid  Family History  Problem Relation Age of Onset  . Cancer Daughter        breast  . Breast cancer Daughter 49  . Aneurysm Mother        brain  . Hyperlipidemia Mother   . Thyroid disease Mother   . Heart failure Father   . Hyperlipidemia Father   . Scleroderma Sister   . COPD Sister   . Hyperlipidemia Sister   . Thyroid disease Sister   . Hyperlipidemia Brother   . Heart attack Brother   . Healthy Sister   . Alcohol abuse Brother     Social History Social History   Tobacco Use  . Smoking status: Former Smoker    Packs/day: 1.50    Years: 40.00    Pack years: 60.00    Types: Cigarettes    Last attempt to quit: 10/01/2001    Years since quitting: 15.4  . Smokeless tobacco: Former Network engineer Use Topics  . Alcohol use: Yes    Comment: socially  . Drug use: No    Review of Systems =Constitutional: No documented fever did feel chills Eyes: No visual changes. ENT: No sore throat. No stiff neck no neck pain Cardiovascular: The HPI regarding chest pain Respiratory: The HPI regarding shortness of breath Gastrointestinal:   no vomiting.  Did have some diarrhea a few days ago but none since decreased p.o., has not had anything to eat for the last few days because she has not felt like it.  No constipation. Genitourinary: Negative for dysuria. Musculoskeletal: Negative lower extremity swelling Skin: Negative for rash. Neurological: Negative for severe headaches,  focal weakness or numbness.   ____________________________________________   PHYSICAL EXAM:  VITAL SIGNS: ED Triage Vitals [02/22/17 1539]  Enc Vitals Group     BP (!) 110/58     Pulse Rate (!) 101     Resp (!) 22     Temp 98.1 F (36.7 C)     Temp Source Oral     SpO2 92 %     Weight 105 lb (47.6 kg)     Height      Head Circumference      Peak Flow      Pain Score      Pain Loc      Pain Edu?  Excl. in [place]?     Constitutional: Alert and oriented.  Frail elderly woman, who feels mildly ill Eyes: Conjunctivae are pale Head: Atraumatic HEENT: No congestion/rhinnorhea. Mucous membranes are moist.  Oropharynx non-erythematous Neck:   Nontender with no meningismus, no masses, no stridor Cardiovascular: Normal rate, regular rhythm. Grossly normal heart sounds.  Good peripheral circulation. Respiratory: Patient is decreased on the left, occasional rhonchi possibly appreciated on the right, she is on her home oxygen, seems to have some degree of tachypnea Chest: Tender palpation the right chest wall when he touches the area patient states "ouch that the pain right there" and pulls back, there is no crepitus no flail chest Abdominal: Soft and nontender. No distention. No guarding no rebound Back:  There is no focal tenderness or step off.  there is no midline tenderness there are no lesions noted. there is no CVA tenderness Musculoskeletal: No lower extremity tenderness, no upper extremity tenderness. No joint effusions, no DVT signs strong distal pulses no edema Neurologic:  Normal speech and language. No gross focal neurologic deficits are appreciated.  Skin:  Skin is warm, dry and intact. No rash noted. Psychiatric: Mood and affect are normal. Speech and behavior are normal.  ____________________________________________   LABS (all labs ordered are listed, but only abnormal results are displayed)  Labs Reviewed  LIPASE, BLOOD  COMPREHENSIVE METABOLIC PANEL  CBC   URINALYSIS, COMPLETE (UACMP) WITH MICROSCOPIC  LACTIC ACID, PLASMA  LACTIC ACID, PLASMA  PROTIME-INR  BRAIN NATRIURETIC PEPTIDE  TROPONIN I    Pertinent labs  results that were available during my care of the patient were reviewed by me and considered in my medical decision making (see chart for details). ____________________________________________  EKG  I personally interpreted any EKGs ordered by me or triage Sinus tach rate 100, normal axis, borderline LVH, nonspecific ST changes ____________________________________________  RADIOLOGY  Pertinent labs & imaging results that were available during my care of the patient were reviewed by me and considered in my medical decision making (see chart for details). If possible, patient and/or family made aware of any abnormal findings.  No results found. ____________________________________________    PROCEDURES  Procedure(s) performed: None  Procedures  Critical Care performed: None  ____________________________________________   INITIAL IMPRESSION / ASSESSMENT AND PLAN / ED COURSE  Pertinent labs & imaging results that were available during my care of the patient were reviewed by me and considered in my medical decision making (see chart for details).  Patient here with cough, chest wall pain, weakness, subjective fevers, increased oxygen requirement at home, and loose stools.  Differential is quite broad most likely this is pneumonia I think however we will continue to evaluate patient giving IV fluids, checking diffuse blood work urine etc. we will obtain a chest x-ray to start is at this time her belly seems benign    ____________________________________________   FINAL CLINICAL IMPRESSION(S) / ED DIAGNOSES  Final diagnoses:  None      This chart was dictated using voice recognition software.  Despite best efforts to proofread,  errors can occur which can change meaning.      Schuyler Amor, MD 02/22/17  [phone]

## 2017-02-22 NOTE — ED Notes (Signed)
This RN called lab to verify blood work. Lab states will run. Monica in Lab is whom I spoke too.

## 2017-02-22 NOTE — ED Notes (Signed)
Blood [place] x1 sent at this time.

## 2017-02-23 ENCOUNTER — Other Ambulatory Visit: Payer: Self-pay

## 2017-02-23 LAB — PROCALCITONIN: PROCALCITONIN: 1.52 ng/mL

## 2017-02-23 LAB — INFLUENZA PANEL BY PCR (TYPE A & B)
INFLAPCR: NEGATIVE
INFLBPCR: NEGATIVE

## 2017-02-23 LAB — BASIC METABOLIC PANEL
ANION GAP: 9 (ref 5–15)
BUN: 21 mg/dL — ABNORMAL HIGH (ref 6–20)
CHLORIDE: 94 mmol/L — AB (ref 101–111)
CO2: 26 mmol/L (ref 22–32)
Calcium: 8.2 mg/dL — ABNORMAL LOW (ref 8.9–10.3)
Creatinine, Ser: 0.6 mg/dL (ref 0.44–1.00)
GFR calc Af Amer: >60 mL/min (ref >60)
GFR calc non Af Amer: >60 mL/min (ref >60)
GLUCOSE: 95 mg/dL (ref 65–99)
POTASSIUM: 3.2 mmol/L — AB (ref 3.5–5.1)
Sodium: 129 mmol/L — ABNORMAL LOW (ref 135–145)

## 2017-02-23 LAB — CBC
HEMATOCRIT: 32.8 % — AB (ref 35.0–47.0)
HEMOGLOBIN: 10.9 g/dL — AB (ref 12.0–16.0)
MCH: 32 pg (ref 26.0–34.0)
MCHC: 33.2 g/dL (ref 32.0–36.0)
MCV: 96.4 fL (ref 80.0–100.0)
Platelets: 300 10*3/uL (ref 150–440)
RBC: 3.4 MIL/uL — AB (ref 3.80–5.20)
RDW: 14.2 % (ref 11.5–14.5)
WBC: 13.7 10*3/uL — ABNORMAL HIGH (ref 3.6–11.0)

## 2017-02-23 LAB — STREP PNEUMONIAE URINARY ANTIGEN: Strep Pneumo Urinary Antigen: NEGATIVE

## 2017-02-23 LAB — TROPONIN I
TROPONIN I: 0.05 ng/mL — AB (ref <0.03)
Troponin I: 0.06 ng/mL (ref <0.03)
Troponin I: 0.12 ng/mL (ref <0.03)

## 2017-02-23 MED ORDER — ALBUTEROL SULFATE (2.5 MG/3ML) 0.083% IN NEBU
2.5000 mg | INHALATION_SOLUTION | Freq: Four times a day (QID) | RESPIRATORY_TRACT | Status: DC
  Administered 2017-02-23 – 2017-02-24 (×4): 2.5 mg via RESPIRATORY_TRACT
  Filled 2017-02-23 (×4): qty 3

## 2017-02-23 MED ORDER — LORAZEPAM 0.5 MG PO TABS
0.2500 mg | ORAL_TABLET | Freq: Every day | ORAL | Status: DC
  Administered 2017-02-23: 0.25 mg via ORAL
  Filled 2017-02-23: qty 1

## 2017-02-23 MED ORDER — METHYLPREDNISOLONE SODIUM SUCC 40 MG IJ SOLR
40.0000 mg | Freq: Every day | INTRAMUSCULAR | Status: DC
  Administered 2017-02-23 – 2017-02-24 (×2): 40 mg via INTRAVENOUS
  Filled 2017-02-23 (×2): qty 1

## 2017-02-23 MED ORDER — POTASSIUM CHLORIDE CRYS ER 20 MEQ PO TBCR
40.0000 meq | EXTENDED_RELEASE_TABLET | Freq: Once | ORAL | Status: AC
  Administered 2017-02-23: 40 meq via ORAL
  Filled 2017-02-23: qty 2

## 2017-02-23 NOTE — Plan of Care (Signed)
VSS, free of falls during shift.  Denies pain.  Oriented to unit, no needs overnight.  Bed in low position, call bell within reach.  WCTM.

## 2017-02-23 NOTE — Progress Notes (Signed)
Patient ID: [name], female   DOB: 1933/12/11, 84 y.o.   MRN: [id number]  [employer] PROGRESS NOTE  [name] QMG:[id number] DOB: 12/20/1933 DOA: 02/22/2017 PCP: Jody Crouch, MD  HPI/Subjective: Patient admitted with sepsis and pneumonia.  Patient feeling a little bit better.  When I saw her she was sleeping a lot.  She asked me when she can go home.  Patient with cough.  Patient had chewing tobacco in her mouth.  Patient was lethargic but able to answer a few questions.  Objective: Vitals:   02/23/17 0350 02/23/17 0725  BP: (!) 114/48 (!) 101/46  Pulse: (!) 109 93  Resp: 18 18  Temp: 100 F (37.8 C)   SpO2: 96% 98%    Filed Weights   02/22/17 1539 02/22/17 2032  Weight: 47.6 kg (105 lb) 47.6 kg (105 lb)    ROS: Review of Systems  Unable to perform ROS: Acuity of condition  Respiratory: Positive for cough and wheezing.   Cardiovascular: Negative for chest pain.  Gastrointestinal: Negative for abdominal pain.   Exam: Physical Exam  Constitutional: She appears lethargic.  HENT:  Nose: No mucosal edema.  Mouth/Throat: No oropharyngeal exudate or posterior oropharyngeal edema.  Eyes: Conjunctivae, EOM and lids are normal. Pupils are equal, round, and reactive to light.  Neck: No JVD present. Carotid bruit is not present. No edema present. No thyroid mass and no thyromegaly present.  Cardiovascular: S1 normal and S2 normal. Exam reveals no gallop.  No murmur heard. Pulses:      Dorsalis pedis pulses are 2+ on the right side, and 2+ on the left side.  Respiratory: No respiratory distress. She has decreased breath sounds in the right lower field and the left lower field. She has wheezes in the right middle field, the right lower field, the left middle field and the left lower field. She has no rhonchi. She has no rales.  GI: Soft. Bowel sounds are normal. There is no tenderness.  Musculoskeletal:       Right ankle: She exhibits no swelling.       Left  ankle: She exhibits no swelling.  Lymphadenopathy:    She has no cervical adenopathy.  Neurological: She appears lethargic.  Skin: Skin is warm. No rash noted. Nails show no clubbing.  Psychiatric: She has a normal mood and affect.      Data Reviewed: Basic Metabolic Panel: Recent Labs  Lab 02/22/17 1743 02/23/17 0126  NA 130* 129*  K 3.9 3.2*  CL 95* 94*  CO2 27 26  GLUCOSE 80 95  BUN 26* 21*  CREATININE 0.72 0.60  CALCIUM 8.5* 8.2*   Liver Function Tests: Recent Labs  Lab 02/22/17 1743  AST 25  ALT 19  ALKPHOS 73  BILITOT 0.7  PROT 6.0*  ALBUMIN 2.7*   Recent Labs  Lab 02/22/17 1743  LIPASE 19    CBC: Recent Labs  Lab 02/22/17 1743 02/23/17 0126  WBC 16.3* 13.7*  HGB 10.5* 10.9*  HCT 31.3* 32.8*  MCV 96.3 96.4  PLT 294 300   Cardiac Enzymes: Recent Labs  Lab 02/22/17 1743 02/23/17 0126 02/23/17 0914 02/23/17 1324  TROPONINI 0.35* 0.06* 0.12* 0.05*   BNP (last 3 results) Recent Labs    02/22/17 1743  BNP 187.0*      Recent Results (from the past 240 hour(s))  Culture, blood (routine x 2)     Status: None (Preliminary result)   Collection Time: 02/22/17  5:43 PM  Result  Value Ref Range Status   Specimen Description BLOOD LAC  Final   Special Requests   Final    BOTTLES DRAWN AEROBIC AND ANAEROBIC Blood Culture adequate volume   Culture   Final    NO GROWTH < 24 HOURS Performed at [facility], [address] [place], [place] [address]    Report Status PENDING  Incomplete  Culture, blood (routine x 2)     Status: None (Preliminary result)   Collection Time: 02/22/17  5:43 PM  Result Value Ref Range Status   Specimen Description BLOOD RAC  Final   Special Requests   Final    BOTTLES DRAWN AEROBIC AND ANAEROBIC Blood Culture adequate volume   Culture   Final    NO GROWTH < 24 HOURS Performed at [facility], [address], [place], [place] [address]    Report Status PENDING  Incomplete     Studies: Dg  Chest 2 View  Result Date: 02/22/2017 CLINICAL DATA:  Hervey Ard right-sided chest wall pain for the past day or 2. Symptoms are worsened with movement or coughing. History of left upper lobe malignancy, coronary artery disease and previous MI EXAM: CHEST  2 VIEW COMPARISON:  Chest x-ray of May 09, 2015 FINDINGS: The lungs remain well-expanded. The interstitial markings are increased greatest at the right lung base. There is atelectasis of portions of the right middle lobe. There is a stable approximately 3 mm nodule in the left upper lobe. The heart and pulmonary vascularity are normal. There calcification in the wall of the aortic arch. The bony thorax exhibits no acute abnormality. IMPRESSION: Right middle lobe atelectasis. Probable interstitial pneumonia in the right lower lobe. This is superimposed upon chronic bronchitic changes. Followup PA and lateral chest X-ray is recommended in 3-4 weeks following trial of antibiotic therapy to ensure resolution and exclude underlying malignancy. Thoracic aortic atherosclerosis. Electronically Signed   By: David  Martinique M.D.   On: 02/22/2017 16:56    Scheduled Meds: . albuterol  2.5 mg Inhalation Q6H  . aspirin EC  81 mg Oral Daily  . atorvastatin  20 mg Oral Daily  . cholecalciferol  1,000 Units Oral Daily  . enoxaparin (LOVENOX) injection  40 mg Subcutaneous Q24H  . escitalopram  10 mg Oral Daily  . LORazepam  0.25 mg Oral QHS  . megestrol  400 mg Oral Daily  . methylPREDNISolone (SOLU-MEDROL) injection  40 mg Intravenous Daily  . mirtazapine  30 mg Oral Daily  . mometasone-formoterol  2 puff Inhalation BID  . multivitamin with minerals  1 tablet Oral Daily  . pantoprazole  40 mg Oral Daily  . senna-docusate  1 tablet Oral Daily  . tiotropium  18 mcg Inhalation Daily   Continuous Infusions: . azithromycin    . cefTRIAXone (ROCEPHIN)  IV      Assessment/Plan:  1. Clinical sepsis with pneumonia on Rocephin and Zithromax.  Blood pressure on the  lower side.  Try to stop IV fluids at this point.  Elevated pro-calcitonin level. 2. COPD exacerbation, chronic respiratory failure on 2 L of oxygen.  Start Solu-Medrol 40 mg daily.  Make nebulizer treatments standing dose.  Continue Spiriva and Dulera inhaler. 3. Weakness.  Get physical therapy evaluation 4. Elevated troponin demand ischemia.  Discontinue telemetry. 5. Hyponatremia.  Discontinue fluids and monitor.   6. Hypokalemia replace potassium 7. Depression and anxiety on lorazepam and Lexapro 8. GERD on Protonix 9. Hyperlipidemia unspecified on atorvastatin  Code Status:     Code  Status Orders  (From admission, onward)        Start     Ordered   02/22/17 2000  Full code  Continuous     02/22/17 1959    Code Status History    Date Active Date Inactive Code Status Order ID Comments User Context   This patient has a current code status but no historical code status.    Advance Directive Documentation     Most Recent Value  Type of Advance Directive  Healthcare Power of Attorney  Pre-existing out of facility DNR order (yellow form or pink MOST form)  No data  "MOST" Form in Place?  No data     Family Communication: Daughter at bedside Disposition Plan: Patient would like to go home  Antibiotics:  Rocephin  Zithromax  Time spent: 30 minutes  [place]

## 2017-02-23 NOTE — Evaluation (Signed)
Physical Therapy Evaluation Patient Details Name: [name] MRN: [id number] DOB: 1933/10/19 Today's Date: 02/23/2017   History of Present Illness  Pt admitted after coming into the ED with abdominal pain.  PMH includes: Cancer ([place]), GERD, Hyperlipidemia, SOB, MI, ovarian cysts.  Clinical Impression  Pt is am 84 year old female who lives alone in a one story home.  She presented with mild fatigue, kyphosis and a forward flexed posture, limiting mobility.  Pt required min assist for bed mobility to EOB and for STS transfer.  Pt used RW for ambulation, reporting that she has not been using an AD for ambulation at home.  She was able to ambulate 15 ft around bed to recliner with close CGA.  Pt required VC's for proper use of AD and some safety awareness.  Pt presented with generalized weakness in UE/LE and reported that she has neuropathy in her feet.  PT discussed with pt the importance of continuation of PT and returning to the [place] program for her COPD.  Pt stated that she did not think that she needs either.  Pt will continue to benefit from skilled PT with focus on strength, functional mobility, tolerance to activity and proper use of AD.    Follow Up Recommendations Home health PT    Equipment Recommendations       Recommendations for Other Services       Precautions / Restrictions Precautions Precautions: Fall      Mobility  Bed Mobility Overal bed mobility: Needs Assistance Bed Mobility: Sidelying to Sit   Sidelying to sit: Min assist       General bed mobility comments: Required some manual assistance to slide to EOB.  Pt is able to bring LE's over EOB.  Transfers Overall transfer level: Needs assistance Equipment used: Rolling walker (2 wheeled) Transfers: Sit to/from Stand Sit to Stand: Min guard         General transfer comment: Pt is able to perform STS, requiring increased time and VC's from PT concerning posture, hand placement and proper use of  RW.  Ambulation/Gait Ambulation/Gait assistance: Min guard Ambulation Distance (Feet): 15 Feet Assistive device: Rolling walker (2 wheeled)     Gait velocity interpretation: Below normal speed for age/gender General Gait Details: Pt presented with decreased step length, low foot clearance and forward flexed posture during ambulation.  Pt required VC's for management of RW in proximity to body.  Stairs            Wheelchair Mobility    Modified Rankin (Stroke Patients Only)       Balance Overall balance assessment: Modified Independent(Pt's daughter and granddaughter report that the pt has fallen in the past two weeks.)                                           Pertinent Vitals/Pain Pain Assessment: No/denies pain    Home Living                        Prior Function                 Hand Dominance        Extremity/Trunk Assessment   Upper Extremity Assessment Upper Extremity Assessment: Generalized weakness    Lower Extremity Assessment Lower Extremity Assessment: Generalized weakness    Cervical / Trunk Assessment Cervical / Trunk Assessment:  Kyphotic  Communication      Cognition Arousal/Alertness: Awake/alert Behavior During Therapy: WFL for tasks assessed/performed Overall Cognitive Status: Within Functional Limits for tasks assessed                                        General Comments      Exercises     Assessment/Plan    PT Assessment Patient needs continued PT services  PT Problem List Decreased strength;Decreased activity tolerance;Decreased balance;Decreased mobility;Decreased safety awareness;Decreased knowledge of use of DME       PT Treatment Interventions DME instruction;Gait training;Functional mobility training;Therapeutic activities;Therapeutic exercise;Balance training;Patient/family education    PT Goals (Current goals can be found in the Care Plan section)  Acute Rehab  PT Goals Patient Stated Goal: to return home PT Goal Formulation: With patient Time For Goal Achievement: 03/09/17 Potential to Achieve Goals: Good    Frequency Min 2X/week   Barriers to discharge        Co-evaluation               AM-PAC PT "6 Clicks" Daily Activity  Outcome Measure Difficulty turning over in bed (including adjusting bedclothes, sheets and blankets)?: A Little Difficulty moving from lying on back to sitting on the side of the bed? : A Little Difficulty sitting down on and standing up from a chair with arms (e.g., wheelchair, bedside commode, etc,.)?: A Little Help needed moving to and from a bed to chair (including a wheelchair)?: A Little Help needed walking in hospital room?: A Little Help needed climbing 3-5 steps with a railing? : A Little 6 Click Score: 18    End of Session Equipment Utilized During Treatment: Gait belt Activity Tolerance: Patient limited by fatigue;Treatment limited secondary to agitation Patient left: in chair;with call bell/phone within reach;with chair alarm set   PT Visit Diagnosis: Unsteadiness on feet (R26.81);Muscle weakness (generalized) (M62.81);History of falling (Z91.81)    Time: 1425-1445 PT Time Calculation (min) (ACUTE ONLY): 20 min   Charges:   PT Evaluation $PT Eval Low Complexity: 1 Low PT Treatments $Therapeutic Activity: 8-22 mins   PT G Codes:   PT G-Codes **NOT FOR INPATIENT CLASS** Functional Assessment Tool Used: AM-PAC 6 Clicks Basic Mobility Functional Limitation: Mobility: Walking and moving around Mobility: Walking and Moving Around Current Status (Y7829): At least 60 percent but less than 80 percent impaired, limited or restricted Mobility: Walking and Moving Around Goal Status [phone]): At least 1 percent but less than 20 percent impaired, limited or restricted    Roxanne Gates, PT, DPT   Roxanne Gates 02/23/2017, 4:14 PM

## 2017-02-24 LAB — BASIC METABOLIC PANEL
ANION GAP: 9 (ref 5–15)
BUN: 19 mg/dL (ref 6–20)
CALCIUM: 8.3 mg/dL — AB (ref 8.9–10.3)
CO2: 27 mmol/L (ref 22–32)
Chloride: 101 mmol/L (ref 101–111)
Creatinine, Ser: 0.6 mg/dL (ref 0.44–1.00)
GLUCOSE: 121 mg/dL — AB (ref 65–99)
POTASSIUM: 4.1 mmol/L (ref 3.5–5.1)
SODIUM: 137 mmol/L (ref 135–145)

## 2017-02-24 LAB — LEGIONELLA PNEUMOPHILA SEROGP 1 UR AG: L. pneumophila Serogp 1 Ur Ag: NEGATIVE

## 2017-02-24 LAB — HIV ANTIBODY (ROUTINE TESTING W REFLEX): HIV Screen 4th Generation wRfx: NONREACTIVE

## 2017-02-24 LAB — PROCALCITONIN: PROCALCITONIN: 0.89 ng/mL

## 2017-02-24 MED ORDER — PREDNISONE 50 MG PO TABS: 50.0000 mg | ORAL_TABLET | Freq: Every day | ORAL | 0 refills | Status: AC

## 2017-02-24 MED ORDER — AZITHROMYCIN 500 MG PO TABS: 500.0000 mg | ORAL_TABLET | Freq: Every day | ORAL | 0 refills | Status: AC

## 2017-02-24 MED ORDER — CEFUROXIME AXETIL 500 MG PO TABS: 500.0000 mg | ORAL_TABLET | Freq: Two times a day (BID) | ORAL | 0 refills | Status: DC

## 2017-02-24 MED ORDER — DEXTROMETHORPHAN HBR 15 MG/5ML PO SYRP: 10.0000 mL | ORAL_SOLUTION | Freq: Four times a day (QID) | ORAL | 0 refills | Status: DC | PRN

## 2017-02-24 MED ORDER — BENZONATATE 100 MG PO CAPS: 100.0000 mg | ORAL_CAPSULE | Freq: Three times a day (TID) | ORAL | 0 refills | Status: DC | PRN

## 2017-02-24 NOTE — Care Management Important Message (Signed)
Important Message  Patient Details  Name: [name] MRN: [id number] Date of Birth: [birth year]   Medicare Important Message Given:  Yes    Shelbie Ammons, RN 02/24/2017, 7:18 AM

## 2017-02-24 NOTE — Care Management Note (Signed)
Case Management Note  Patient Details  Name: [name] MRN: [id number] Date of Birth: [birth year]  Subjective/Objective:     Admitted to [facility] with the diagnosis of pneumonia. Lives alone. Daughter is [name] [phone]). Sees Dr. Doy Hutching every 3 months. Prescriptions are filled at [place]. Home Health in the past, many years ago. Doesn't remember name of agency. No skilled nursing. Home oxygen 2 liters per nasal cannula at night only. Home oxygen is provided by [employer] since 2013. Nebulizer, cane, and grab bars in the home. Takes care of all basic and instrumental activities of daily living herself, drives. No falls, but sometimes gets down in the floor and can't get back up. Poor appetite since being sick. Daughter will transport  Action/Plan: Physical therapy evaluation completed. Recommending home physical therapy and [facility]. Discussed home health agencies. [place] has no co-pays since 02/15/17.  Granddaughter is a physical therapist at [facility] and will be helping with exercises.  [place]. Floydene Flock, Advanced representative updated. Does not want to go to [facility] again  Expected Discharge Date:  02/24/17               Expected Discharge Plan:     In-House Referral:   yes  Discharge planning Services   yes  Post Acute Care Choice:     yes                              Choice offered to:   patient  DME Arranged:    DME Agency:     HH Arranged:   yes HH Agency:   [facility]  Status of Service:     If discussed at [place] of Stay Meetings, dates discussed:    Additional Comments:  Shelbie Ammons, RN MSN CCM Care Management [phone] 02/24/2017, 9:00 AM

## 2017-02-24 NOTE — Discharge Summary (Signed)
[facility] [place] [facility] NAME: [name]    MR#:  [id number]  DATE OF BIRTH:  07/02/1933  DATE OF ADMISSION:  02/22/2017 ADMITTING PHYSICIAN: Nicholes Mango, MD  DATE OF DISCHARGE: 02/24/2017  PRIMARY CARE PHYSICIAN: Idelle Crouch, MD    ADMISSION DIAGNOSIS:  Community acquired pneumonia, unspecified laterality [J18.9]  DISCHARGE DIAGNOSIS:  Active Problems:   PNA (pneumonia)   SECONDARY DIAGNOSIS:   Past Medical History:  Diagnosis Date  . Cancer ([place])    lung, chemo  . GERD (gastroesophageal reflux disease)   . Hyperlipidemia   . Lung disease   . Ovarian cyst 07/30/2014  . Past heart attack    "stress"- per pt has had 2.  . Personal history of chemotherapy   . Shortness of breath     HOSPITAL COURSE:    84 year old female with known history of lung cancer, hyperlipidemia who presented with shortness of breath and cough.  1. Sepsis: Patient presented with leukocytosis and tachycardia.  Sepsis was due to community acquired pneumonia. Resolved.  2. Community-acquired pneumonia: Patient was on IV Rocephin and azithromycin. She is being discharged on Ceftin and azithromycin for 6 more days. Her symptoms have improved.  3. Hyponatremia: This was from poor by mouth intake and pneumonia. Sodium level has improved with IV fluids.  4. Elevated troponin: This is due to demand ischemia. Patient was ruled out for ACS.  5. Hypokalemia: This was repleted.  6. Acute COPD exacerbation with chronic hypoxic respiratory failure on 2 L of oxygen at home: Patient was started on Cytomel gel. At the time of discharge patient has no wheezing. She will discharge on prednisone 50 mg for 4 more days. She will continue outpatient inhalers.  DISCHARGE CONDITIONS AND DIET:   Stable for discharge on heart healthy diet  CONSULTS OBTAINED:    DRUG ALLERGIES:   Allergies  Allergen Reactions  . Morphine And Related Other (See Comments)     halluncinations  . Sulfonamide Derivatives Itching  . Tetanus Toxoid Itching and Swelling    DISCHARGE MEDICATIONS:   Allergies as of 02/24/2017      Reactions   Morphine And Related Other (See Comments)   halluncinations   Sulfonamide Derivatives Itching   Tetanus Toxoid Itching, Swelling      Medication List    TAKE these medications   ADVAIR DISKUS 250-50 MCG/DOSE Aepb Generic drug:  Fluticasone-Salmeterol INHALE 1 PUFF TWICE DAILY.  RINSE MOUTH AFTER EACH USE   albuterol 108 (90 Base) MCG/ACT inhaler Commonly known as:  PROVENTIL HFA;VENTOLIN HFA Inhale 2 puffs into the lungs every 6 (six) hours as needed for wheezing or shortness of breath.   aspirin 81 MG tablet Take 81 mg by mouth daily.   atorvastatin 20 MG tablet Commonly known as:  LIPITOR Take 20 mg by mouth daily.   azithromycin 500 MG tablet Commonly known as:  ZITHROMAX Take 1 tablet (500 mg total) by mouth daily for 6 days. Take 1 tablet daily for 6 days.   benzonatate 100 MG capsule Commonly known as:  TESSALON PERLES Take 1 capsule (100 mg total) by mouth 3 (three) times daily as needed for cough.   CALCIUM 600 + D PO Take 1 tablet by mouth daily.   cefUROXime 500 MG tablet Commonly known as:  CEFTIN Take 1 tablet (500 mg total) by mouth 2 (two) times daily with a meal.   CENTRUM SILVER PO Take 1 tablet daily by mouth.   cholecalciferol  1000 units tablet Commonly known as:  VITAMIN D Take 1,000 Units by mouth daily.   dextromethorphan 15 MG/5ML syrup Take 10 mLs (30 mg total) by mouth 4 (four) times daily as needed for cough.   escitalopram 10 MG tablet Commonly known as:  LEXAPRO Take 1 tablet by mouth daily.   LORazepam 1 MG tablet Commonly known as:  ATIVAN Take 0.5 mg by mouth at bedtime.   megestrol 400 MG/10ML suspension Commonly known as:  MEGACE Take 400 mg daily by mouth.   mirtazapine 30 MG tablet Commonly known as:  REMERON Take 1 tablet by mouth daily.   omeprazole  40 MG capsule Commonly known as:  PRILOSEC Take 1 capsule by mouth daily.   OXYGEN Inhale 2 L into the lungs at bedtime.   predniSONE 50 MG tablet Commonly known as:  DELTASONE Take 1 tablet (50 mg total) by mouth daily with breakfast for 4 days.   senna-docusate 8.6-50 MG tablet Commonly known as:  Senokot-S Take 1 tablet daily by mouth.   SPIRIVA HANDIHALER 18 MCG inhalation capsule Generic drug:  tiotropium Place 18 mcg into inhaler and inhale daily.         Today   CHIEF COMPLAINT:    Symptoms have improved however has persistent cough. No fevers overnight. Ready for discharge today.  VITAL SIGNS:  Blood pressure (!) 98/52, pulse 84, temperature 97.8 F (36.6 C), temperature source Oral, resp. rate 17, height 5\' 3"  (1.6 m), weight 47.6 kg (105 lb), SpO2 98 %.   REVIEW OF SYSTEMS:  Review of Systems  Constitutional: Negative.  Negative for chills, fever and malaise/fatigue.  HENT: Negative.  Negative for ear discharge, ear pain, hearing loss, nosebleeds and sore throat.   Eyes: Negative.  Negative for blurred vision and pain.  Respiratory: Positive for cough. Negative for hemoptysis, shortness of breath and wheezing.   Cardiovascular: Negative.  Negative for chest pain, palpitations and leg swelling.  Gastrointestinal: Negative.  Negative for abdominal pain, blood in stool, diarrhea, nausea and vomiting.  Genitourinary: Negative.  Negative for dysuria.  Musculoskeletal: Negative.  Negative for back pain.  Skin: Negative.   Neurological: Negative for dizziness, tremors, speech change, focal weakness, seizures and headaches.  Endo/Heme/Allergies: Negative.  Does not bruise/bleed easily.  Psychiatric/Behavioral: Negative.  Negative for depression, hallucinations and suicidal ideas.     PHYSICAL EXAMINATION:  GENERAL:  84 y.o.-year-old patient lying in the bed with no acute distress.  NECK:  Supple, no jugular venous distention. No thyroid enlargement, no  tenderness.  LUNGS: Normal breath sounds bilaterally, no wheezing, rales,rhonchi  No use of accessory muscles of respiration.  CARDIOVASCULAR: S1, S2 normal. No murmurs, rubs, or gallops.  ABDOMEN: Soft, non-tender, non-distended. Bowel sounds present. No organomegaly or mass.  EXTREMITIES: No pedal edema, cyanosis, or clubbing.  PSYCHIATRIC: The patient is alert and oriented x 3.  SKIN: No obvious rash, lesion, or ulcer.   DATA REVIEW:   CBC Recent Labs  Lab 02/23/17 0126  WBC 13.7*  HGB 10.9*  HCT 32.8*  PLT 300    Chemistries  Recent Labs  Lab 02/22/17 1743  02/24/17 0559  NA 130*   < > 137  K 3.9   < > 4.1  CL 95*   < > 101  CO2 27   < > 27  GLUCOSE 80   < > 121*  BUN 26*   < > 19  CREATININE 0.72   < > 0.60  CALCIUM 8.5*   < >  8.3*  AST 25  --   --   ALT 19  --   --   ALKPHOS 73  --   --   BILITOT 0.7  --   --    < > = values in this interval not displayed.    Cardiac Enzymes Recent Labs  Lab 02/23/17 0126 02/23/17 0914 02/23/17 1324  TROPONINI 0.06* 0.12* 0.05*    Microbiology Results  @MICRORSLT48 @  RADIOLOGY:  Dg Chest 2 View  Result Date: 02/22/2017 CLINICAL DATA:  Hervey Ard right-sided chest wall pain for the past day or 2. Symptoms are worsened with movement or coughing. History of left upper lobe malignancy, coronary artery disease and previous MI EXAM: CHEST  2 VIEW COMPARISON:  Chest x-ray of May 09, 2015 FINDINGS: The lungs remain well-expanded. The interstitial markings are increased greatest at the right lung base. There is atelectasis of portions of the right middle lobe. There is a stable approximately 3 mm nodule in the left upper lobe. The heart and pulmonary vascularity are normal. There calcification in the wall of the aortic arch. The bony thorax exhibits no acute abnormality. IMPRESSION: Right middle lobe atelectasis. Probable interstitial pneumonia in the right lower lobe. This is superimposed upon chronic bronchitic changes. Followup PA  and lateral chest X-ray is recommended in 3-4 weeks following trial of antibiotic therapy to ensure resolution and exclude underlying malignancy. Thoracic aortic atherosclerosis. Electronically Signed   By: David  Martinique M.D.   On: 02/22/2017 16:56      Allergies as of 02/24/2017      Reactions   Morphine And Related Other (See Comments)   halluncinations   Sulfonamide Derivatives Itching   Tetanus Toxoid Itching, Swelling      Medication List    TAKE these medications   ADVAIR DISKUS 250-50 MCG/DOSE Aepb Generic drug:  Fluticasone-Salmeterol INHALE 1 PUFF TWICE DAILY.  RINSE MOUTH AFTER EACH USE   albuterol 108 (90 Base) MCG/ACT inhaler Commonly known as:  PROVENTIL HFA;VENTOLIN HFA Inhale 2 puffs into the lungs every 6 (six) hours as needed for wheezing or shortness of breath.   aspirin 81 MG tablet Take 81 mg by mouth daily.   atorvastatin 20 MG tablet Commonly known as:  LIPITOR Take 20 mg by mouth daily.   azithromycin 500 MG tablet Commonly known as:  ZITHROMAX Take 1 tablet (500 mg total) by mouth daily for 6 days. Take 1 tablet daily for 6 days.   benzonatate 100 MG capsule Commonly known as:  TESSALON PERLES Take 1 capsule (100 mg total) by mouth 3 (three) times daily as needed for cough.   CALCIUM 600 + D PO Take 1 tablet by mouth daily.   cefUROXime 500 MG tablet Commonly known as:  CEFTIN Take 1 tablet (500 mg total) by mouth 2 (two) times daily with a meal.   CENTRUM SILVER PO Take 1 tablet daily by mouth.   cholecalciferol 1000 units tablet Commonly known as:  VITAMIN D Take 1,000 Units by mouth daily.   dextromethorphan 15 MG/5ML syrup Take 10 mLs (30 mg total) by mouth 4 (four) times daily as needed for cough.   escitalopram 10 MG tablet Commonly known as:  LEXAPRO Take 1 tablet by mouth daily.   LORazepam 1 MG tablet Commonly known as:  ATIVAN Take 0.5 mg by mouth at bedtime.   megestrol 400 MG/10ML suspension Commonly known as:   MEGACE Take 400 mg daily by mouth.   mirtazapine 30 MG tablet Commonly known as:  REMERON Take 1 tablet by mouth daily.   omeprazole 40 MG capsule Commonly known as:  PRILOSEC Take 1 capsule by mouth daily.   OXYGEN Inhale 2 L into the lungs at bedtime.   predniSONE 50 MG tablet Commonly known as:  DELTASONE Take 1 tablet (50 mg total) by mouth daily with breakfast for 4 days.   senna-docusate 8.6-50 MG tablet Commonly known as:  Senokot-S Take 1 tablet daily by mouth.   SPIRIVA HANDIHALER 18 MCG inhalation capsule Generic drug:  tiotropium Place 18 mcg into inhaler and inhale daily.        A   Management plans discussed with the patient and she is in agreement. Stable for discharge   Patient should follow up with pcp  CODE STATUS:     Code Status Orders  (From admission, onward)        Start     Ordered   02/22/17 2000  Full code  Continuous     02/22/17 1959    Code Status History    Date Active Date Inactive Code Status Order ID Comments User Context   This patient has a current code status but no historical code status.    Advance Directive Documentation     Most Recent Value  Type of Advance Directive  Healthcare Power of Attorney  Pre-existing out of facility DNR order (yellow form or pink MOST form)  No data  "MOST" Form in Place?  No data      TOTAL TIME TAKING CARE OF THIS PATIENT: 358 minutes.    Note: This dictation was prepared with Dragon dictation along with smaller phrase technology. Any transcriptional errors that result from this process are unintentional.  Davonda Ausley M.D on 02/24/2017 at 8:11 AM  Between 7am to 6pm - Pager - [phone] After 6pm go to [email or web address] - password [email or web address] [facility]  Office  [phone]  CC: Primary care physician; Idelle Crouch, MD

## 2017-02-24 NOTE — Progress Notes (Signed)
[name] to be D/C'd Home per MD order. Patient given discharge teaching and paperwork regarding medications, diet, follow-up appointments and activity. Patient understanding verbalized. No questions or complaints at this time. Skin condition as charted. IV and telemetry removed prior to leaving.  No further needs by Care Management/Social Work. Prescriptions given to patient.   An After Visit Summary was printed and given to the patient. Caregiver/family present during discharge teaching.     [name]

## 2017-02-24 NOTE — Discharge Instructions (Addendum)

## 2017-02-27 LAB — CULTURE, BLOOD (ROUTINE X 2)
CULTURE: NO GROWTH
Culture: NO GROWTH
Special Requests: ADEQUATE
Special Requests: ADEQUATE

## 2017-02-28 IMAGING — US US BREAST LTD UNI LEFT INC AXILLA
1 series · 2 of 2 positions shown · non-contrast
Comparison: Previous exam(s).

CLINICAL DATA: Followup left breast biopsy-proven papilloma. The
papilloma was not excised due to the fact that she is not considered
a candidate for general anesthesia as a result of health issues. She
continues to have left nipple discharge.

EXAM:
DIGITAL DIAGNOSTIC BILATERAL MAMMOGRAM WITH 3D TOMOSYNTHESIS WITH
CAD
ULTRASOUND LEFT BREAST

[Series 1: us breast ltd uni left inc axilla · 0.08mm/px · 2 of 2 slices shown]
[im 1/2]
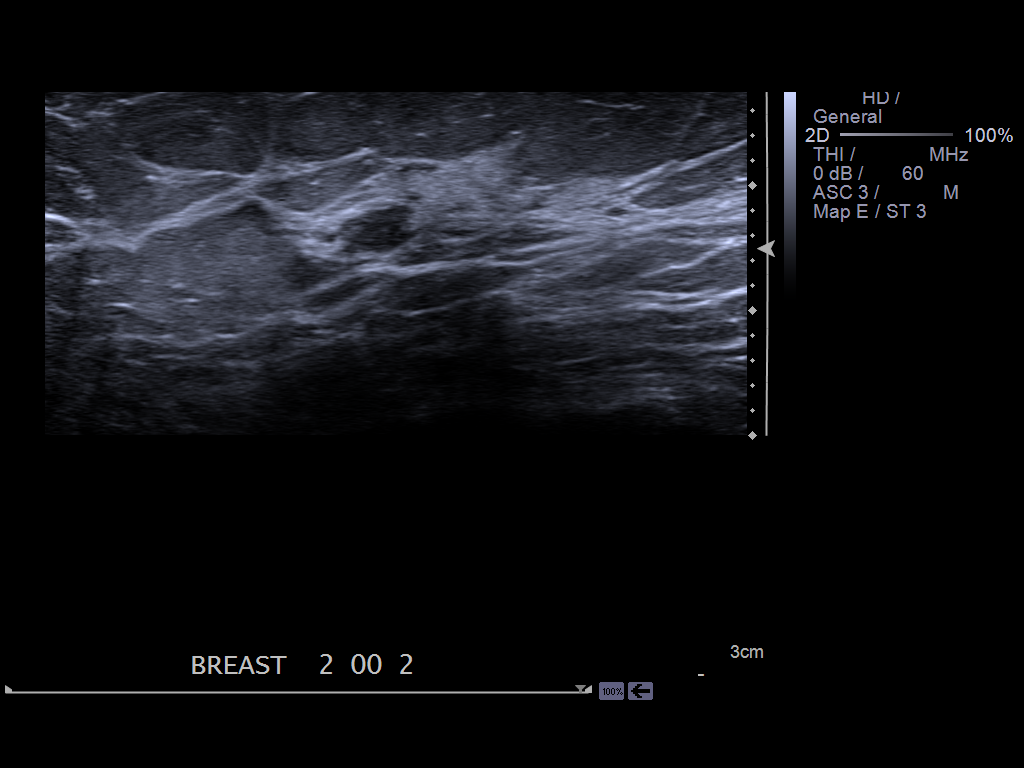
[im 2/2]
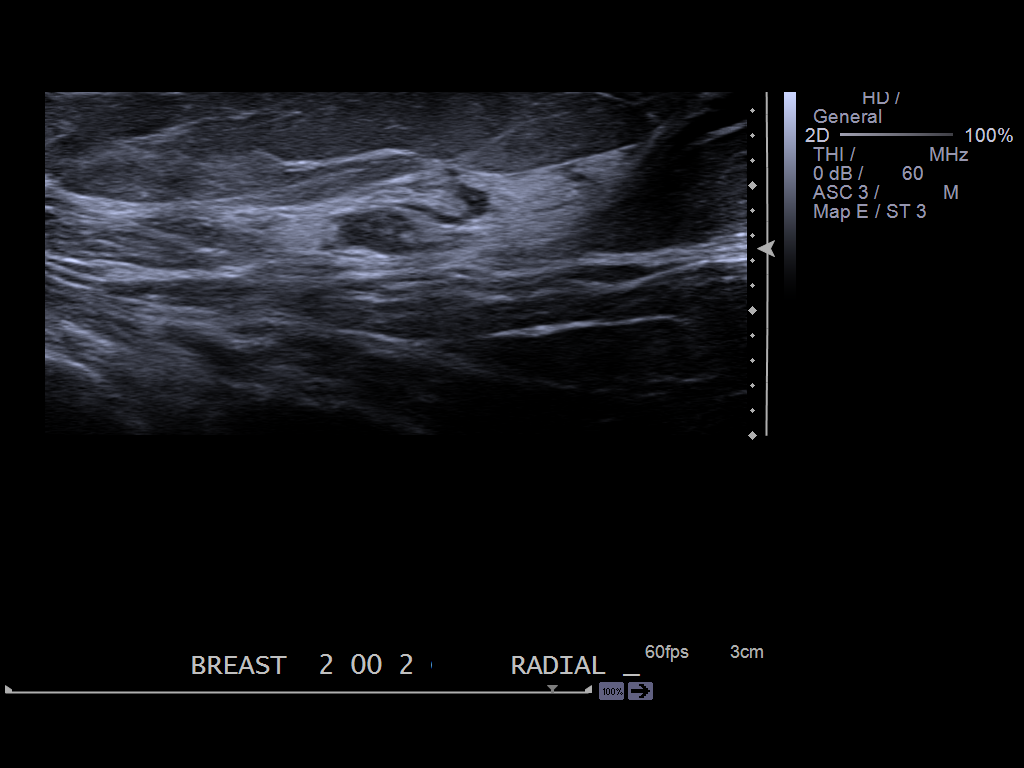

[2 of 2 positions shown; findings below may reference images not displayed]

ACR Breast Density Category b: There are scattered areas of
fibroglandular density.
FINDINGS: The previously biopsied papilloma in the upper-outer quadrant of the
left breast, marked with a wing shaped biopsy marker clip, is not
clearly visible separate from the clip mammographically. No findings
elsewhere in either breast suspicious for malignancy.

Mammographic images were processed with CAD.

On physical exam, no mass is palpable in the upper outer left
breast.

Targeted ultrasound is performed, showing a 6 x 6 x 4 mm oval,
horizontally oriented, mildly irregular hypoechoic mass without
increased or decreased through transmission of sound in the 2
o'clock position of the left breast, 2 cm from the nipple. This also
measured 6 x 6 x 4 mm on 11/17/2012, prior to biopsy. The shape of
the mass has not changed significantly since that time.
IMPRESSION: Stable biopsy-proven papilloma in the 2 o'clock position of the left
breast. If the patient is still deemed not a surgical candidate, a
followup left breast ultrasound is recommended in 6 months.

RECOMMENDATION:
Left breast ultrasound in 6 months.

I have discussed the findings and recommendations with the patient.
Results were also provided in writing at the conclusion of the
visit. If applicable, a reminder letter will be sent to the patient
regarding the next appointment.

BI-RADS CATEGORY  3: Probably benign.

## 2017-03-01 DIAGNOSIS — C3492 Malignant neoplasm of unspecified part of left bronchus or lung: Secondary | ICD-10-CM | POA: Diagnosis not present

## 2017-03-01 DIAGNOSIS — Z792 Long term (current) use of antibiotics: Secondary | ICD-10-CM | POA: Diagnosis not present

## 2017-03-01 DIAGNOSIS — Z7951 Long term (current) use of inhaled steroids: Secondary | ICD-10-CM | POA: Diagnosis not present

## 2017-03-01 DIAGNOSIS — Z7982 Long term (current) use of aspirin: Secondary | ICD-10-CM | POA: Diagnosis not present

## 2017-03-01 DIAGNOSIS — Z87891 Personal history of nicotine dependence: Secondary | ICD-10-CM | POA: Diagnosis not present

## 2017-03-01 DIAGNOSIS — K219 Gastro-esophageal reflux disease without esophagitis: Secondary | ICD-10-CM | POA: Diagnosis not present

## 2017-03-01 DIAGNOSIS — Z9181 History of falling: Secondary | ICD-10-CM | POA: Diagnosis not present

## 2017-03-01 DIAGNOSIS — J440 Chronic obstructive pulmonary disease with acute lower respiratory infection: Secondary | ICD-10-CM | POA: Diagnosis not present

## 2017-03-01 DIAGNOSIS — Z9981 Dependence on supplemental oxygen: Secondary | ICD-10-CM | POA: Diagnosis not present

## 2017-03-01 DIAGNOSIS — J189 Pneumonia, unspecified organism: Secondary | ICD-10-CM | POA: Diagnosis not present

## 2017-03-01 DIAGNOSIS — J441 Chronic obstructive pulmonary disease with (acute) exacerbation: Secondary | ICD-10-CM | POA: Diagnosis not present

## 2017-03-01 DIAGNOSIS — J9611 Chronic respiratory failure with hypoxia: Secondary | ICD-10-CM | POA: Diagnosis not present

## 2017-03-01 DIAGNOSIS — E785 Hyperlipidemia, unspecified: Secondary | ICD-10-CM | POA: Diagnosis not present

## 2017-03-01 DIAGNOSIS — R32 Unspecified urinary incontinence: Secondary | ICD-10-CM | POA: Diagnosis not present

## 2017-03-05 DIAGNOSIS — J449 Chronic obstructive pulmonary disease, unspecified: Secondary | ICD-10-CM | POA: Diagnosis not present

## 2017-03-07 ENCOUNTER — Telehealth: Payer: Self-pay | Admitting: Pharmacist

## 2017-03-07 NOTE — Patient Outreach (Signed)
[facility]) Care Management  03/07/2017   [name] Oct 03, 1933 [id number]  Subjective: [name] is an 84 y/o female recently discharged from [facility] for CAP/COPD exacerbation. She refused to provide a medication history over the phone since she has a home health nurse and many providers that know her history, and was not familiar with [place]. She did consent to tell me that her home health nurse has been handling her care since discharge and she has no complaints at this time.  Objective:   Current Medications:  Current Outpatient Medications  Medication Sig Dispense Refill  . ADVAIR DISKUS 250-50 MCG/DOSE AEPB INHALE 1 PUFF TWICE DAILY.  RINSE MOUTH AFTER EACH USE 60 each 3  . albuterol (PROVENTIL HFA;VENTOLIN HFA) 108 (90 Base) MCG/ACT inhaler Inhale 2 puffs into the lungs every 6 (six) hours as needed for wheezing or shortness of breath.    Marland Kitchen aspirin 81 MG tablet Take 81 mg by mouth daily.    Marland Kitchen atorvastatin (LIPITOR) 20 MG tablet Take 20 mg by mouth daily.     . benzonatate (TESSALON PERLES) 100 MG capsule Take 1 capsule (100 mg total) by mouth 3 (three) times daily as needed for cough. 30 capsule 0  . Calcium Carb-Cholecalciferol (CALCIUM 600 + D PO) Take 1 tablet by mouth daily.    . cefUROXime (CEFTIN) 500 MG tablet Take 1 tablet (500 mg total) by mouth 2 (two) times daily with a meal. 12 tablet 0  . cholecalciferol (VITAMIN D) 1000 UNITS tablet Take 1,000 Units by mouth daily.    Marland Kitchen dextromethorphan 15 MG/5ML syrup Take 10 mLs (30 mg total) by mouth 4 (four) times daily as needed for cough. 120 mL 0  . escitalopram (LEXAPRO) 10 MG tablet Take 1 tablet by mouth daily.    Marland Kitchen LORazepam (ATIVAN) 1 MG tablet Take 0.5 mg by mouth at bedtime.     . megestrol (MEGACE) 400 MG/10ML suspension Take 400 mg daily by mouth.    . mirtazapine (REMERON) 30 MG tablet Take 1 tablet by mouth daily.    . Multiple Vitamins-Minerals (CENTRUM SILVER PO) Take 1 tablet daily by mouth.     Marland Kitchen omeprazole (PRILOSEC) 40 MG capsule Take 1 capsule by mouth daily.    . OXYGEN Inhale 2 L into the lungs at bedtime.     . senna-docusate (SENOKOT-S) 8.6-50 MG tablet Take 1 tablet daily by mouth.    . SPIRIVA HANDIHALER 18 MCG inhalation capsule Place 18 mcg into inhaler and inhale daily.      No current facility-administered medications for this visit.     Functional Status:  In your present state of health, do you have any difficulty performing the following activities: 02/22/2017  Hearing? Y  Vision? N  Difficulty concentrating or making decisions? N  Walking or climbing stairs? Y  Dressing or bathing? Y  Doing errands, shopping? Y  Some recent data might be hidden    Fall/Depression Screening: Fall Risk  05/06/2015  Falls in the past year? No   PHQ 2/9 Scores 07/08/2015 05/06/2015  PHQ - 2 Score 0 1  PHQ- 9 Score 3 3    ASSESSMENT: Date Discharged from Hospital: 02/24/2017 Date Medication Reconciliation Performed: 03/07/2017  New Medications at Discharge:  Azithromycin 500 mg daily x 6 days  Benzonatate 100 mg TID prn  Cefuroxime 500 mg BID with a meal  Prednisone 50 mg daily x 4 days  Dextromethorphan 30 mg QID prn  Patient was recently discharged from  hospital and all medications have been reviewed   Drugs sorted by system:  Neurologic/Psychologic: - escitalopram 10 mg daily - lorazepam 0.5 mg HS - mirtazapine 30 mg daily  Cardiovascular: - aspirin 81 mg daily - atorvastatin 20 mg daily  Pulmonary/Allergy: - Advair Diskus 250/50 1 puff BID - albuterol HFA 2 puffs q6h prn - Spiriva 1 puff daily  Gastrointestinal: - megestrol 400 mg daily - omeprazole 40 mg daily - senna/docusate  Endocrine:  Renal:  Topical:  Pain:  Vitamins/Minerals: - calcium 600 + vitamin D daily - adult multivitamin - cholecalciferol 1000 units daily  Infectious Diseases: done with course per chart - cefuroxime 500 mg BID x 6 days  - azithromycin 500 mg daily x  6 days  - prednisone 50 mg daily x 4 days  - benzonatate 100 mg TID prn - dextromethorphan 30 mg QID prn  Miscellaneous:  Duplications in therapy: vitamin D Gaps in therapy: n/a Medications to avoid in the elderly: lorazepam Drug interactions: n/a Other issues noted: unable to verify current medications with patient although she assures me her meds are well known by her providers    PLAN: - Instructed patient to take new medications as prescribed  - She is done with courses of antibiotics/steroids from discharge per chart   Charlene Brooke, PharmD PGY1 Pharmacy Resident Email: [email or web address] Pager: [phone]

## 2017-03-09 DIAGNOSIS — C3490 Malignant neoplasm of unspecified part of unspecified bronchus or lung: Secondary | ICD-10-CM | POA: Diagnosis not present

## 2017-03-09 DIAGNOSIS — J181 Lobar pneumonia, unspecified organism: Secondary | ICD-10-CM | POA: Diagnosis not present

## 2017-03-09 DIAGNOSIS — R7989 Other specified abnormal findings of blood chemistry: Secondary | ICD-10-CM | POA: Diagnosis not present

## 2017-03-09 DIAGNOSIS — Z79899 Other long term (current) drug therapy: Secondary | ICD-10-CM | POA: Diagnosis not present

## 2017-03-09 DIAGNOSIS — Z0189 Encounter for other specified special examinations: Secondary | ICD-10-CM | POA: Diagnosis not present

## 2017-03-09 DIAGNOSIS — J189 Pneumonia, unspecified organism: Secondary | ICD-10-CM | POA: Diagnosis not present

## 2017-03-09 DIAGNOSIS — J431 Panlobular emphysema: Secondary | ICD-10-CM | POA: Diagnosis not present

## 2017-03-15 DIAGNOSIS — J441 Chronic obstructive pulmonary disease with (acute) exacerbation: Secondary | ICD-10-CM | POA: Diagnosis not present

## 2017-03-15 DIAGNOSIS — J189 Pneumonia, unspecified organism: Secondary | ICD-10-CM | POA: Diagnosis not present

## 2017-03-15 DIAGNOSIS — J440 Chronic obstructive pulmonary disease with acute lower respiratory infection: Secondary | ICD-10-CM | POA: Diagnosis not present

## 2017-03-15 DIAGNOSIS — J9611 Chronic respiratory failure with hypoxia: Secondary | ICD-10-CM | POA: Diagnosis not present

## 2017-04-05 DIAGNOSIS — J449 Chronic obstructive pulmonary disease, unspecified: Secondary | ICD-10-CM | POA: Diagnosis not present

## 2017-04-06 ENCOUNTER — Ambulatory Visit: Payer: Self-pay | Admitting: Urology

## 2017-04-18 DIAGNOSIS — N390 Urinary tract infection, site not specified: Secondary | ICD-10-CM | POA: Diagnosis not present

## 2017-04-18 DIAGNOSIS — J431 Panlobular emphysema: Secondary | ICD-10-CM | POA: Diagnosis not present

## 2017-04-18 DIAGNOSIS — Z79899 Other long term (current) drug therapy: Secondary | ICD-10-CM | POA: Diagnosis not present

## 2017-04-18 DIAGNOSIS — J189 Pneumonia, unspecified organism: Secondary | ICD-10-CM | POA: Diagnosis not present

## 2017-05-02 DIAGNOSIS — M546 Pain in thoracic spine: Secondary | ICD-10-CM | POA: Diagnosis not present

## 2017-05-03 DIAGNOSIS — J449 Chronic obstructive pulmonary disease, unspecified: Secondary | ICD-10-CM | POA: Diagnosis not present

## 2017-05-11 DIAGNOSIS — M546 Pain in thoracic spine: Secondary | ICD-10-CM | POA: Diagnosis not present

## 2017-05-11 DIAGNOSIS — R600 Localized edema: Secondary | ICD-10-CM | POA: Diagnosis not present

## 2017-05-11 DIAGNOSIS — J431 Panlobular emphysema: Secondary | ICD-10-CM | POA: Diagnosis not present

## 2017-05-11 DIAGNOSIS — M5489 Other dorsalgia: Secondary | ICD-10-CM | POA: Diagnosis not present

## 2017-05-12 ENCOUNTER — Ambulatory Visit
Admission: RE | Admit: 2017-05-12 | Discharge: 2017-05-12 | Disposition: A | Discharge status: Home / Self Care | Payer: PPO | Source: Ambulatory Visit | Attending: Physician Assistant | Admitting: Physician Assistant

## 2017-05-12 ENCOUNTER — Other Ambulatory Visit: Payer: Self-pay | Admitting: Physician Assistant

## 2017-05-12 DIAGNOSIS — S22060A Wedge compression fracture of T7-T8 vertebra, initial encounter for closed fracture: Secondary | ICD-10-CM | POA: Diagnosis not present

## 2017-05-12 DIAGNOSIS — X58XXXA Exposure to other specified factors, initial encounter: Secondary | ICD-10-CM | POA: Insufficient documentation

## 2017-05-12 DIAGNOSIS — S22000A Wedge compression fracture of unspecified thoracic vertebra, initial encounter for closed fracture: Secondary | ICD-10-CM | POA: Insufficient documentation

## 2017-05-18 ENCOUNTER — Encounter
Admission: RE | Admit: 2017-05-18 | Discharge: 2017-05-18 | Disposition: A | Discharge status: Home / Self Care | Payer: PPO | Source: Ambulatory Visit | Attending: Orthopedic Surgery | Admitting: Orthopedic Surgery

## 2017-05-18 ENCOUNTER — Ambulatory Visit
Admission: RE | Admit: 2017-05-18 | Discharge: 2017-05-18 | Disposition: A | Discharge status: Home / Self Care | Payer: PPO | Source: Ambulatory Visit | Attending: Anesthesiology | Admitting: Anesthesiology

## 2017-05-18 ENCOUNTER — Other Ambulatory Visit: Payer: Self-pay

## 2017-05-18 DIAGNOSIS — R918 Other nonspecific abnormal finding of lung field: Secondary | ICD-10-CM | POA: Insufficient documentation

## 2017-05-18 DIAGNOSIS — J189 Pneumonia, unspecified organism: Secondary | ICD-10-CM | POA: Insufficient documentation

## 2017-05-18 DIAGNOSIS — Z01818 Encounter for other preprocedural examination: Secondary | ICD-10-CM | POA: Insufficient documentation

## 2017-05-18 HISTORY — DX: Localized edema: R60.0

## 2017-05-18 HISTORY — DX: Acute myocardial infarction, unspecified: I21.9

## 2017-05-18 HISTORY — DX: Anxiety disorder, unspecified: F41.9

## 2017-05-18 HISTORY — DX: Atherosclerotic heart disease of native coronary artery without angina pectoris: I25.10

## 2017-05-18 HISTORY — DX: Chronic obstructive pulmonary disease, unspecified: J44.9

## 2017-05-18 LAB — SURGICAL PCR SCREEN
MRSA, PCR: NEGATIVE
STAPHYLOCOCCUS AUREUS: NEGATIVE

## 2017-05-18 MED ORDER — CEFAZOLIN SODIUM-DEXTROSE 1-4 GM/50ML-% IV SOLN: 1.0000 g | Freq: Once | INTRAVENOUS | Status: DC

## 2017-05-18 NOTE — Pre-Procedure Instructions (Signed)
Called Dr Andree Elk regarding patient's SOB, lower extremity edema, O2 dependence.  Patient placed on stretcher after going to bathroom. O2 sat 88% and dropped to 79%. Begged to sit upright.  Latter tried lying on stomach O2 sat 96% on O2 @  2 L/min.  Notified Dr Andree Elk regarding above mentioned.  Medical clearance requested. Sent for CXR.

## 2017-05-18 NOTE — Patient Instructions (Signed)
Your procedure is scheduled on: 05/19/17 10:30 Report to Same Day Surgery 2nd floor medical mall [facility] Entrance-take elevator on left to 2nd floor.  Check in with surgery information desk.)   Remember: Instructions that are not followed completely may result in serious medical risk, up to and including death, or upon the discretion of your surgeon and anesthesiologist your surgery may need to be rescheduled.    _x___ 1. Do not eat food after midnight the night before your procedure. You may drink clear liquids up to 2 hours before you are scheduled to arrive at the hospital for your procedure.  Do not drink clear liquids within 2 hours of your scheduled arrival to the hospital.  Clear liquids include  --Water or Apple juice without pulp  --Clear carbohydrate beverage such as ClearFast or Gatorade  --Black Coffee or Clear Tea (No milk, no creamers, do not add anything to                  the coffee or Tea Type 1 and type 2 diabetics should only drink water.  No gum chewing or hard candies.     __x__ 2. No Alcohol for 24 hours before or after surgery.   __x__3. No Smoking or e-cigarettes for 24 prior to surgery.  Do not use any chewable tobacco products for at least 6 hour prior to surgery   ____  4. Bring all medications with you on the day of surgery if instructed.    __x__ 5. Notify your doctor if there is any change in your medical condition     (cold, fever, infections).    x___6. On the morning of surgery brush your teeth with toothpaste and water.  You may rinse your mouth with mouth wash if you wish.  Do not swallow any toothpaste or mouthwash.   Do not wear jewelry, make-up, hairpins, clips or nail polish.  Do not wear lotions, powders, or perfumes. You may wear deodorant.  Do not shave 48 hours prior to surgery. Men may shave face and neck.  Do not bring valuables to the hospital.    [facility] is not responsible for any belongings or valuables.    Contacts, dentures or bridgework may not be worn into surgery.  Leave your suitcase in the car. After surgery it may be brought to your room.  For patients admitted to the hospital, discharge time is determined by your                       treatment team.  _  Patients discharged the day of surgery will not be allowed to drive home.  You will need someone to drive you home and stay with you the night of your procedure.    Please read over the following fact sheets that you were given:   [facility] [place] Preparing for Surgery and or MRSA Information   _x___ Take anti-hypertensive listed below, cardiac, seizure, asthma,     anti-reflux and psychiatric medicines. These include:  1. ADVAIR DISKUS 250-50 MCG/DOSE AEPB  2.albuterol (PROVENTIL HFA;VENTOLIN HFA) 108 (90 Base) MCG/ACT inhaler  3.escitalopram (LEXAPRO) 10 MG tablet  4.omeprazole (PRILOSEC) 40 MG capsule  5.oxyCODONE-acetaminophen (PERCOCET/ROXICET) 5-325 MG tablet  6.SPIRIVA HANDIHALER 18 MCG inhalation capsule  ____Fleets enema or Magnesium Citrate as directed.   _x___ Use CHG Soap or sage wipes as directed on instruction sheet   ____ Use inhalers on the day of surgery and bring to hospital day of surgery  ____ Stop Metformin and Janumet 2 days prior to surgery.    ____ Take 1/2 of usual insulin dose the night before surgery and none on the morning     surgery.   _x___ Follow recommendations from Cardiologist, Pulmonologist or PCP regarding          stopping Aspirin, Coumadin, Plavix ,Eliquis, Effient, or Pradaxa, and Pletal.  X____Stop Anti-inflammatories such as Advil, Aleve, Ibuprofen, Motrin, Naproxen, Naprosyn, Goodies powders or aspirin products. OK to take Tylenol and                          Celebrex.   _x___ Stop supplements until after surgery.  But may continue Vitamin D, Vitamin B,       and multivitamin.   ____ Bring C-Pap to the hospital.

## 2017-05-18 NOTE — Pre-Procedure Instructions (Addendum)
Dr Rudene Christians and Dr Doy Hutching' office [place]) notified regarding need for medical clearance.

## 2017-05-19 ENCOUNTER — Ambulatory Visit: Admission: RE | Admit: 2017-05-19 | Payer: PPO | Source: Ambulatory Visit | Admitting: Orthopedic Surgery

## 2017-05-19 ENCOUNTER — Encounter: Admission: RE | Payer: Self-pay | Source: Ambulatory Visit

## 2017-05-19 SURGERY — KYPHOPLASTY
Anesthesia: Choice

## 2017-05-20 DIAGNOSIS — M5489 Other dorsalgia: Secondary | ICD-10-CM | POA: Diagnosis not present

## 2017-05-20 DIAGNOSIS — J431 Panlobular emphysema: Secondary | ICD-10-CM | POA: Diagnosis not present

## 2017-05-27 DIAGNOSIS — R600 Localized edema: Secondary | ICD-10-CM | POA: Diagnosis not present

## 2017-05-27 DIAGNOSIS — J431 Panlobular emphysema: Secondary | ICD-10-CM | POA: Diagnosis not present

## 2017-05-27 DIAGNOSIS — S22000A Wedge compression fracture of unspecified thoracic vertebra, initial encounter for closed fracture: Secondary | ICD-10-CM | POA: Diagnosis not present

## 2017-05-30 ENCOUNTER — Ambulatory Visit: Payer: PPO | Admitting: Pulmonary Disease

## 2017-05-30 ENCOUNTER — Encounter: Payer: Self-pay | Admitting: Pulmonary Disease

## 2017-05-30 VITALS — BP 110/68 | HR 88 | Ht 62.0 in | Wt 119.0 lb

## 2017-05-30 DIAGNOSIS — Z01811 Encounter for preprocedural respiratory examination: Secondary | ICD-10-CM

## 2017-05-30 DIAGNOSIS — J449 Chronic obstructive pulmonary disease, unspecified: Secondary | ICD-10-CM | POA: Diagnosis not present

## 2017-05-30 DIAGNOSIS — M8588 Other specified disorders of bone density and structure, other site: Secondary | ICD-10-CM | POA: Diagnosis not present

## 2017-05-30 DIAGNOSIS — J9611 Chronic respiratory failure with hypoxia: Secondary | ICD-10-CM

## 2017-05-30 DIAGNOSIS — M4854XS Collapsed vertebra, not elsewhere classified, thoracic region, sequela of fracture: Secondary | ICD-10-CM

## 2017-05-30 DIAGNOSIS — Z87891 Personal history of nicotine dependence: Secondary | ICD-10-CM

## 2017-05-30 DIAGNOSIS — S22000A Wedge compression fracture of unspecified thoracic vertebra, initial encounter for closed fracture: Secondary | ICD-10-CM | POA: Diagnosis not present

## 2017-05-30 NOTE — Patient Instructions (Signed)
Continue Spiriva-once daily in the morning Change Advair inhaler to twice a day Continue albuterol rescue inhaler as needed for chest tightness, wheezing, cough, increased shortness of breath Continue oxygen therapy is close to 24 hours/day as possible  With regard to possible upcoming surgery, the the procedure itself is low risk.  However, the risk of pulmonary complications is significant due to the severity of your COPD.  There are a few things that can be done to reduce this risk other than the therapies you are currently on.  The potential benefit of this procedure is a substantial reduction in pain.  This is a benefit that might justify accepting the risk of the procedure.  That is your decision to make.  If you choose to undergo the procedure, I will make recommendations to the surgeons and anesthesiologists to try to minimize the risk of pulmonary complications.  If you do undergo the procedure and require hospitalization, we will make sure that the pulmonary medicine service is notified.  Otherwise, follow-up in 3 months or sooner as needed.

## 2017-05-30 NOTE — Progress Notes (Signed)
PULMONARY CONSULT NOTE  Requesting MD/Service: self Date of initial consultation: 05/30/17 Reason for consultation:   PT PROFILE: 84 y.o. female former smoker (quit 2003) with very severe COPD, previously seen by Dr. Stevenson Clinch and referred back to pulmonary medicine for preoperative pulmonary evaluation  DATA: 07/09/08 CT chest: (Report only) No evidence of recurrent malignancy.  No mediastinal or hilar adenopathy identified.  Postsurgical changes in LUL noted.  Bilateral emphysema. 02/22/12 CT chest: (Report only) stable scarring in both lungs but predominantly in the LUL posteriorly.  Tiny areas of nodularity present in both lungs also stable.  Patchy density in lingula more conspicuous than in past.  No mediastinal or hilar adenopathy. 03/09/13 LDCT: (Report only) new 4 mm LLL nodule.  Severe emphysema.  Lingular bronchiectasis 12/04/13 LDCT: (Report only) previously noted LLL nodule less bulky than prior examination (3 mm).  This is considered benign.  No new suspicious appearing pulmonary nodules or masses. 12/03/14 CT chest: Severe emphysema.  Postsurgical changes in LUL.  Stable small bilateral pulmonary nodules.  Mild bronchiectatic appearing changes in RML and RLL 12/25/14 PFTs: FVC: 1.58 L (65 %pred), FEV1: 0.61 L (34 %pred), FEV1/FVC: 39%, TLC: 4.75 L (76 %pred), DLCO 29 %pred, DLCO/VA 51%.  Suspect lung volumes are underestimated due to gas dilution technique used 05/12/17 MRI T-spine: Acute or subacute compression fracture at T7 with loss of height of 50%. No retropulsed bone or traumatic disc herniation. No canal compromise. This looks like a benign osteoporotic fracture without secondary worrisome feature  HPI:  As above.  She has been seen previously by Dr. Stevenson Clinch.  She has very severe COPD.  She also has a history of lung cancer status post resection.  She has been on oxygen therapy since 2013.  She has been wearing oxygen 24 hours/day since approximately 6 months ago.  She is a  former heavy smoker with no significant occupational exposures.  At the present time, she has profound, limiting exertional dyspnea.  She struggles with activities of daily living and can walk at most across the room before she is out of breath.  She was hospitalized in January with "pneumonia".  Approximately 1 month ago, she suffered a nontraumatic thoracic vertebral compression fracture and is suffering with severe pain requiring daily opioid analgesics.  She has been seen by Dr. Rudene Christians PA who has post kyphoplasty procedure.  As I understand, this would be performed in the operating room under general anesthesia and is tentatively scheduled for 06/14/17.  She is referred back to pulmonary medicine for preoperative pulmonary evaluation.  Presently, she is at her baseline as described above.  She denies CP, fever, purulent sputum, hemoptysis, LE edema and calf tenderness.  Past Medical History:  Diagnosis Date  . Anxiety   . Cancer ([place])    lung, chemo  . COPD (chronic obstructive pulmonary disease) ([place])   . Coronary artery disease   . GERD (gastroesophageal reflux disease)   . Hyperlipidemia   . Lower extremity edema   . Lung disease   . Myocardial infarction ([place])   . Ovarian cyst 07/30/2014  . Past heart attack    "stress"- per pt has had 2.  . Personal history of chemotherapy   . Shortness of breath     Past Surgical History:  Procedure Laterality Date  . BREAST BIOPSY Left 11-2012   neg., papilloma  . CHOLECYSTECTOMY  2014  . LUNG SURGERY  2007   upper left lobe  . TONSILLECTOMY      MEDICATIONS: I  have reviewed all medications and confirmed regimen as documented  Social History   Socioeconomic History  . Marital status: Widowed    Spouse name: Not on file  . Number of children: Not on file  . Years of education: Not on file  . Highest education level: Not on file  Occupational History  . Not on file  Social Needs  . Financial resource strain: Not on file  . Food  insecurity:    Worry: Not on file    Inability: Not on file  . Transportation needs:    Medical: Not on file    Non-medical: Not on file  Tobacco Use  . Smoking status: Former Smoker    Packs/day: 1.50    Years: 40.00    Pack years: 60.00    Types: Cigarettes    Last attempt to quit: 10/01/2001    Years since quitting: 15.6  . Smokeless tobacco: Current User  Substance and Sexual Activity  . Alcohol use: Yes    Alcohol/week: 4.2 oz    Types: 7 Glasses of wine per week    Comment: socially  . Drug use: No  . Sexual activity: Not Currently  Lifestyle  . Physical activity:    Days per week: Not on file    Minutes per session: Not on file  . Stress: Not on file  Relationships  . Social connections:    Talks on phone: Not on file    Gets together: Not on file    Attends religious service: Not on file    Active member of club or organization: Not on file    Attends meetings of clubs or organizations: Not on file    Relationship status: Not on file  . Intimate partner violence:    Fear of current or ex partner: Not on file    Emotionally abused: Not on file    Physically abused: Not on file    Forced sexual activity: Not on file  Other Topics Concern  . Not on file  Social History Narrative  . Not on file    Family History  Problem Relation Age of Onset  . Cancer Daughter        breast  . Breast cancer Daughter 25  . Aneurysm Mother        brain  . Hyperlipidemia Mother   . Thyroid disease Mother   . Heart failure Father   . Hyperlipidemia Father   . Scleroderma Sister   . COPD Sister   . Hyperlipidemia Sister   . Thyroid disease Sister   . Hyperlipidemia Brother   . Heart attack Brother   . Healthy Sister   . Alcohol abuse Brother     ROS: No fever, myalgias/arthralgias, unexplained weight loss or weight gain No new focal weakness or sensory deficits No otalgia, hearing loss, visual changes, nasal and sinus symptoms, mouth and throat problems No neck  pain or adenopathy No abdominal pain, N/V/D, diarrhea, change in bowel pattern No dysuria, change in urinary pattern   Vitals:   05/30/17 1429 05/30/17 1442  BP:  110/68  Pulse:  88  SpO2:  (!) 89%  Weight: 119 lb (54 kg)   Height: 5\' 2"  (1.575 m)   2 LPM [place]   EXAM:  Gen: Very frail appearing, no overt respiratory distress HEENT: NCAT, sclera white, oropharynx normal Neck: Supple without LAN, thyromegaly, JVD Thorax/lungs: Moderate kyphosis, breath sounds markedly diminished, no wheezes or other  adventitious sounds Cardiovascular: RRR, no murmurs noted  Abdomen: Soft, nontender, normal BS Ext: without clubbing, cyanosis.  Symmetric bilateral ankle and pedal edema Neuro: CNs grossly intact, motor and sensory intact Skin: Limited exam, no lesions noted  DATA:   BMP Latest Ref Rng & Units 02/24/2017 02/23/2017 02/22/2017  Glucose 65 - 99 mg/dL 121(H) 95 80  BUN 6 - 20 mg/dL 19 21(H) 26(H)  Creatinine 0.44 - 1.00 mg/dL 0.60 0.60 0.72  Sodium 135 - 145 mmol/L 137 129(L) 130(L)  Potassium 3.5 - 5.1 mmol/L 4.1 3.2(L) 3.9  Chloride 101 - 111 mmol/L 101 94(L) 95(L)  CO2 22 - 32 mmol/L 27 26 27   Calcium 8.9 - 10.3 mg/dL 8.3(L) 8.2(L) 8.5(L)    CBC Latest Ref Rng & Units 02/23/2017 02/22/2017 12/11/2014  WBC 3.6 - 11.0 K/uL 13.7(H) 16.3(H) 7.3  Hemoglobin 12.0 - 16.0 g/dL 10.9(L) 10.5(L) 13.9  Hematocrit 35.0 - 47.0 % 32.8(L) 31.3(L) 41.6  Platelets 150 - 440 K/uL 300 294 397    CXR 05/18/17: Previous RLL opacity has resolved.  Changes of COPD persist.  LUL postoperative changes noted.  Compression fracture of T7 noted.  I have personally reviewed all chest radiographs reported above including CXRs and CT chest unless otherwise indicated  IMPRESSION:     ICD-10-CM   1. COPD, very severe ([place]) J44.9   2. Former smoker Z87.891   3. Chronic hypoxemic respiratory failure (HCC) J96.11   4. Compression fracture of thoracic spine with disabling pain M48.54XS   5. Preop pulmonary  evaluation Z01.811    We had a long discussion in the office today regarding her risk of perioperative pulmonary complications.  This would be a relatively low risk procedure but she is an extremely high risk patient.  Overall, I consider her a very poor candidate for any surgery that involves general anesthesia. The most compelling reason to proceed with surgery is that she is in quite a bit of chronic pain.  However, if there are any reasonable alternatives to manage her pain that would not involve general anesthesia, these alternatives should be seriously considered.  There are few things that can be done to mitigate her risk of perioperative pulmonary complications.   If there do not appear to be reasonable alternatives for pain management and she is willing to undergo the procedure with its attendant risks, she will almost certainly require hospitalization post procedure to manage her pulmonary status and the postoperative period.  PLAN:  -Continue Spiriva daily -Continue Advair inhaler twice daily (she has only been using it daily) -Continue albuterol rescue inhaler as needed for chest tightness, wheezing, cough, increased shortness of breath -Continue oxygen therapy is close to 24 hours/day as possible -I will communicate with Dr. Rudene Christians.  I suggested seeking alternatives that do not require general anesthesia.  If she is to undergo surgical kyphoplasty, she will likely require hospitalization postoperatively and I would suggest that pulmonary medicine service be notified -Otherwise, follow-up in 3 months or sooner as needed   Merton Border, MD PCCM service Mobile [phone] Pager [phone] 06/01/2017 4:15 PM

## 2017-06-01 DIAGNOSIS — I2510 Atherosclerotic heart disease of native coronary artery without angina pectoris: Secondary | ICD-10-CM | POA: Diagnosis not present

## 2017-06-01 DIAGNOSIS — C3412 Malignant neoplasm of upper lobe, left bronchus or lung: Secondary | ICD-10-CM | POA: Diagnosis not present

## 2017-06-01 DIAGNOSIS — J439 Emphysema, unspecified: Secondary | ICD-10-CM | POA: Diagnosis not present

## 2017-06-01 DIAGNOSIS — Z7951 Long term (current) use of inhaled steroids: Secondary | ICD-10-CM | POA: Diagnosis not present

## 2017-06-01 DIAGNOSIS — E785 Hyperlipidemia, unspecified: Secondary | ICD-10-CM | POA: Diagnosis not present

## 2017-06-01 DIAGNOSIS — Z87891 Personal history of nicotine dependence: Secondary | ICD-10-CM | POA: Diagnosis not present

## 2017-06-01 DIAGNOSIS — G4700 Insomnia, unspecified: Secondary | ICD-10-CM | POA: Diagnosis not present

## 2017-06-01 DIAGNOSIS — Z9181 History of falling: Secondary | ICD-10-CM | POA: Diagnosis not present

## 2017-06-01 DIAGNOSIS — M4850XD Collapsed vertebra, not elsewhere classified, site unspecified, subsequent encounter for fracture with routine healing: Secondary | ICD-10-CM | POA: Diagnosis not present

## 2017-06-01 DIAGNOSIS — K219 Gastro-esophageal reflux disease without esophagitis: Secondary | ICD-10-CM | POA: Diagnosis not present

## 2017-06-01 DIAGNOSIS — Z7982 Long term (current) use of aspirin: Secondary | ICD-10-CM | POA: Diagnosis not present

## 2017-06-01 DIAGNOSIS — I739 Peripheral vascular disease, unspecified: Secondary | ICD-10-CM | POA: Diagnosis not present

## 2017-06-06 ENCOUNTER — Ambulatory Visit: Payer: PPO | Admitting: Cardiovascular Disease

## 2017-06-06 DIAGNOSIS — J431 Panlobular emphysema: Secondary | ICD-10-CM | POA: Diagnosis not present

## 2017-06-06 DIAGNOSIS — S22000A Wedge compression fracture of unspecified thoracic vertebra, initial encounter for closed fracture: Secondary | ICD-10-CM | POA: Diagnosis not present

## 2017-06-06 DIAGNOSIS — R0602 Shortness of breath: Secondary | ICD-10-CM | POA: Diagnosis not present

## 2017-06-14 ENCOUNTER — Ambulatory Visit: Admit: 2017-06-14 | Payer: PPO | Admitting: Orthopedic Surgery

## 2017-06-14 SURGERY — KYPHOPLASTY
Anesthesia: Choice

## 2017-06-15 ENCOUNTER — Other Ambulatory Visit: Payer: Self-pay | Admitting: Internal Medicine

## 2017-06-15 ENCOUNTER — Ambulatory Visit
Admission: RE | Admit: 2017-06-15 | Discharge: 2017-06-15 | Disposition: A | Discharge status: Home / Self Care | Payer: PPO | Source: Ambulatory Visit | Attending: Internal Medicine | Admitting: Internal Medicine

## 2017-06-15 DIAGNOSIS — R911 Solitary pulmonary nodule: Secondary | ICD-10-CM | POA: Insufficient documentation

## 2017-06-15 DIAGNOSIS — F419 Anxiety disorder, unspecified: Secondary | ICD-10-CM | POA: Diagnosis not present

## 2017-06-15 DIAGNOSIS — F329 Major depressive disorder, single episode, unspecified: Secondary | ICD-10-CM | POA: Diagnosis not present

## 2017-06-15 DIAGNOSIS — I272 Pulmonary hypertension, unspecified: Secondary | ICD-10-CM | POA: Diagnosis not present

## 2017-06-15 DIAGNOSIS — R600 Localized edema: Secondary | ICD-10-CM | POA: Diagnosis not present

## 2017-06-15 DIAGNOSIS — R0602 Shortness of breath: Secondary | ICD-10-CM

## 2017-06-15 DIAGNOSIS — M4854XA Collapsed vertebra, not elsewhere classified, thoracic region, initial encounter for fracture: Secondary | ICD-10-CM

## 2017-06-15 DIAGNOSIS — C3490 Malignant neoplasm of unspecified part of unspecified bronchus or lung: Secondary | ICD-10-CM | POA: Diagnosis not present

## 2017-06-15 DIAGNOSIS — F5104 Psychophysiologic insomnia: Secondary | ICD-10-CM | POA: Diagnosis not present

## 2017-06-15 DIAGNOSIS — S22060A Wedge compression fracture of T7-T8 vertebra, initial encounter for closed fracture: Secondary | ICD-10-CM | POA: Diagnosis not present

## 2017-06-15 DIAGNOSIS — J984 Other disorders of lung: Secondary | ICD-10-CM | POA: Insufficient documentation

## 2017-06-15 DIAGNOSIS — I700 Atherosclerosis of aorta: Secondary | ICD-10-CM | POA: Insufficient documentation

## 2017-06-15 DIAGNOSIS — J439 Emphysema, unspecified: Secondary | ICD-10-CM | POA: Insufficient documentation

## 2017-06-15 DIAGNOSIS — M546 Pain in thoracic spine: Secondary | ICD-10-CM | POA: Diagnosis not present

## 2017-06-15 DIAGNOSIS — J431 Panlobular emphysema: Secondary | ICD-10-CM | POA: Diagnosis not present

## 2017-06-15 MED ORDER — IOPAMIDOL (ISOVUE-370) INJECTION 76%
60.0000 mL | Freq: Once | INTRAVENOUS | Status: AC | PRN
  Administered 2017-06-15: 75 mL via INTRAVENOUS

## 2017-06-18 ENCOUNTER — Encounter: Payer: Self-pay | Admitting: Emergency Medicine

## 2017-06-18 ENCOUNTER — Emergency Department: Payer: PPO

## 2017-06-18 ENCOUNTER — Inpatient Hospital Stay: Payer: PPO

## 2017-06-18 ENCOUNTER — Other Ambulatory Visit: Payer: Self-pay

## 2017-06-18 ENCOUNTER — Inpatient Hospital Stay
Admission: EM | Admit: 2017-06-18 | Discharge: 2017-06-22 | DRG: 190 | Disposition: A | Discharge status: Hospice | Payer: PPO | Attending: Internal Medicine | Admitting: Internal Medicine

## 2017-06-18 DIAGNOSIS — J441 Chronic obstructive pulmonary disease with (acute) exacerbation: Principal | ICD-10-CM | POA: Diagnosis present

## 2017-06-18 DIAGNOSIS — S8991XA Unspecified injury of right lower leg, initial encounter: Secondary | ICD-10-CM | POA: Diagnosis not present

## 2017-06-18 DIAGNOSIS — R600 Localized edema: Secondary | ICD-10-CM | POA: Diagnosis not present

## 2017-06-18 DIAGNOSIS — J9811 Atelectasis: Secondary | ICD-10-CM | POA: Diagnosis present

## 2017-06-18 DIAGNOSIS — J189 Pneumonia, unspecified organism: Secondary | ICD-10-CM | POA: Diagnosis present

## 2017-06-18 DIAGNOSIS — K219 Gastro-esophageal reflux disease without esophagitis: Secondary | ICD-10-CM | POA: Diagnosis present

## 2017-06-18 DIAGNOSIS — R609 Edema, unspecified: Secondary | ICD-10-CM | POA: Diagnosis present

## 2017-06-18 DIAGNOSIS — E878 Other disorders of electrolyte and fluid balance, not elsewhere classified: Secondary | ICD-10-CM | POA: Diagnosis present

## 2017-06-18 DIAGNOSIS — Z803 Family history of malignant neoplasm of breast: Secondary | ICD-10-CM

## 2017-06-18 DIAGNOSIS — Z515 Encounter for palliative care: Secondary | ICD-10-CM | POA: Diagnosis not present

## 2017-06-18 DIAGNOSIS — Z887 Allergy status to serum and vaccine status: Secondary | ICD-10-CM

## 2017-06-18 DIAGNOSIS — Z825 Family history of asthma and other chronic lower respiratory diseases: Secondary | ICD-10-CM

## 2017-06-18 DIAGNOSIS — E785 Hyperlipidemia, unspecified: Secondary | ICD-10-CM | POA: Diagnosis not present

## 2017-06-18 DIAGNOSIS — R296 Repeated falls: Secondary | ICD-10-CM | POA: Diagnosis present

## 2017-06-18 DIAGNOSIS — J9602 Acute respiratory failure with hypercapnia: Secondary | ICD-10-CM

## 2017-06-18 DIAGNOSIS — Z7982 Long term (current) use of aspirin: Secondary | ICD-10-CM | POA: Diagnosis not present

## 2017-06-18 DIAGNOSIS — D649 Anemia, unspecified: Secondary | ICD-10-CM | POA: Diagnosis not present

## 2017-06-18 DIAGNOSIS — I252 Old myocardial infarction: Secondary | ICD-10-CM | POA: Diagnosis not present

## 2017-06-18 DIAGNOSIS — Z6821 Body mass index (BMI) 21.0-21.9, adult: Secondary | ICD-10-CM

## 2017-06-18 DIAGNOSIS — F410 Panic disorder [episodic paroxysmal anxiety] without agoraphobia: Secondary | ICD-10-CM | POA: Diagnosis present

## 2017-06-18 DIAGNOSIS — I248 Other forms of acute ischemic heart disease: Secondary | ICD-10-CM | POA: Diagnosis present

## 2017-06-18 DIAGNOSIS — Z5320 Procedure and treatment not carried out because of patient's decision for unspecified reasons: Secondary | ICD-10-CM | POA: Diagnosis not present

## 2017-06-18 DIAGNOSIS — G9340 Encephalopathy, unspecified: Secondary | ICD-10-CM | POA: Diagnosis not present

## 2017-06-18 DIAGNOSIS — Z9181 History of falling: Secondary | ICD-10-CM

## 2017-06-18 DIAGNOSIS — R0600 Dyspnea, unspecified: Secondary | ICD-10-CM | POA: Diagnosis not present

## 2017-06-18 DIAGNOSIS — Z87891 Personal history of nicotine dependence: Secondary | ICD-10-CM

## 2017-06-18 DIAGNOSIS — Z66 Do not resuscitate: Secondary | ICD-10-CM | POA: Diagnosis not present

## 2017-06-18 DIAGNOSIS — R0689 Other abnormalities of breathing: Secondary | ICD-10-CM | POA: Diagnosis not present

## 2017-06-18 DIAGNOSIS — E440 Moderate protein-calorie malnutrition: Secondary | ICD-10-CM | POA: Diagnosis not present

## 2017-06-18 DIAGNOSIS — M4856XA Collapsed vertebra, not elsewhere classified, lumbar region, initial encounter for fracture: Secondary | ICD-10-CM | POA: Diagnosis not present

## 2017-06-18 DIAGNOSIS — Z811 Family history of alcohol abuse and dependence: Secondary | ICD-10-CM | POA: Diagnosis not present

## 2017-06-18 DIAGNOSIS — Z9049 Acquired absence of other specified parts of digestive tract: Secondary | ICD-10-CM

## 2017-06-18 DIAGNOSIS — Z85118 Personal history of other malignant neoplasm of bronchus and lung: Secondary | ICD-10-CM

## 2017-06-18 DIAGNOSIS — Z885 Allergy status to narcotic agent status: Secondary | ICD-10-CM

## 2017-06-18 DIAGNOSIS — Z7401 Bed confinement status: Secondary | ICD-10-CM | POA: Diagnosis not present

## 2017-06-18 DIAGNOSIS — J9622 Acute and chronic respiratory failure with hypercapnia: Secondary | ICD-10-CM | POA: Diagnosis not present

## 2017-06-18 DIAGNOSIS — R627 Adult failure to thrive: Secondary | ICD-10-CM | POA: Diagnosis present

## 2017-06-18 DIAGNOSIS — I2510 Atherosclerotic heart disease of native coronary artery without angina pectoris: Secondary | ICD-10-CM | POA: Diagnosis present

## 2017-06-18 DIAGNOSIS — I351 Nonrheumatic aortic (valve) insufficiency: Secondary | ICD-10-CM | POA: Diagnosis not present

## 2017-06-18 DIAGNOSIS — Z882 Allergy status to sulfonamides status: Secondary | ICD-10-CM

## 2017-06-18 DIAGNOSIS — R0602 Shortness of breath: Secondary | ICD-10-CM | POA: Diagnosis not present

## 2017-06-18 DIAGNOSIS — J9621 Acute and chronic respiratory failure with hypoxia: Secondary | ICD-10-CM | POA: Diagnosis not present

## 2017-06-18 DIAGNOSIS — M549 Dorsalgia, unspecified: Secondary | ICD-10-CM | POA: Diagnosis not present

## 2017-06-18 DIAGNOSIS — Z9981 Dependence on supplemental oxygen: Secondary | ICD-10-CM

## 2017-06-18 DIAGNOSIS — S3993XA Unspecified injury of pelvis, initial encounter: Secondary | ICD-10-CM | POA: Diagnosis not present

## 2017-06-18 DIAGNOSIS — R410 Disorientation, unspecified: Secondary | ICD-10-CM | POA: Diagnosis present

## 2017-06-18 DIAGNOSIS — Z7189 Other specified counseling: Secondary | ICD-10-CM | POA: Diagnosis not present

## 2017-06-18 DIAGNOSIS — Z8249 Family history of ischemic heart disease and other diseases of the circulatory system: Secondary | ICD-10-CM

## 2017-06-18 DIAGNOSIS — Z8349 Family history of other endocrine, nutritional and metabolic diseases: Secondary | ICD-10-CM

## 2017-06-18 DIAGNOSIS — Z9221 Personal history of antineoplastic chemotherapy: Secondary | ICD-10-CM

## 2017-06-18 DIAGNOSIS — Z902 Acquired absence of lung [part of]: Secondary | ICD-10-CM

## 2017-06-18 DIAGNOSIS — Z7951 Long term (current) use of inhaled steroids: Secondary | ICD-10-CM | POA: Diagnosis not present

## 2017-06-18 DIAGNOSIS — E46 Unspecified protein-calorie malnutrition: Secondary | ICD-10-CM | POA: Diagnosis not present

## 2017-06-18 DIAGNOSIS — R4182 Altered mental status, unspecified: Secondary | ICD-10-CM | POA: Diagnosis not present

## 2017-06-18 DIAGNOSIS — J9601 Acute respiratory failure with hypoxia: Secondary | ICD-10-CM

## 2017-06-18 LAB — CBC
HEMATOCRIT: 35.4 % (ref 35.0–47.0)
HEMOGLOBIN: 11.9 g/dL — AB (ref 12.0–16.0)
MCH: 30.9 pg (ref 26.0–34.0)
MCHC: 33.5 g/dL (ref 32.0–36.0)
MCV: 92.4 fL (ref 80.0–100.0)
Platelets: 558 10*3/uL — ABNORMAL HIGH (ref 150–440)
RBC: 3.83 MIL/uL (ref 3.80–5.20)
RDW: 13.2 % (ref 11.5–14.5)
WBC: 13.3 10*3/uL — ABNORMAL HIGH (ref 3.6–11.0)

## 2017-06-18 LAB — COMPREHENSIVE METABOLIC PANEL
ALK PHOS: 84 U/L (ref 38–126)
ALT: 30 U/L (ref 14–54)
ANION GAP: 8 (ref 5–15)
AST: 35 U/L (ref 15–41)
Albumin: 3.4 g/dL — ABNORMAL LOW (ref 3.5–5.0)
BILIRUBIN TOTAL: 0.4 mg/dL (ref 0.3–1.2)
BUN: 15 mg/dL (ref 6–20)
CALCIUM: 8.5 mg/dL — AB (ref 8.9–10.3)
CO2: 39 mmol/L — ABNORMAL HIGH (ref 22–32)
Chloride: 84 mmol/L — ABNORMAL LOW (ref 101–111)
Creatinine, Ser: 0.77 mg/dL (ref 0.44–1.00)
Glucose, Bld: 144 mg/dL — ABNORMAL HIGH (ref 65–99)
Potassium: 3.5 mmol/L (ref 3.5–5.1)
Sodium: 131 mmol/L — ABNORMAL LOW (ref 135–145)
TOTAL PROTEIN: 6.7 g/dL (ref 6.5–8.1)

## 2017-06-18 LAB — BLOOD GAS, VENOUS
Acid-Base Excess: 16.6 mmol/L — ABNORMAL HIGH (ref 0.0–2.0)
Bicarbonate: 48.5 mmol/L — ABNORMAL HIGH (ref 20.0–28.0)
O2 Saturation: UNDETERMINED %
PCO2 VEN: 108 mmHg — AB (ref 44.0–60.0)
PH VEN: 7.26 (ref 7.250–7.430)
Patient temperature: 37

## 2017-06-18 LAB — BLOOD GAS, ARTERIAL
Acid-Base Excess: 18.9 mmol/L — ABNORMAL HIGH (ref 0.0–2.0)
BICARBONATE: 46.7 mmol/L — AB (ref 20.0–28.0)
FIO2: 0.32
O2 Saturation: 97.7 %
PCO2 ART: 72 mmHg — AB (ref 32.0–48.0)
PH ART: 7.42 (ref 7.350–7.450)
PO2 ART: 98 mmHg (ref 83.0–108.0)
Patient temperature: 37

## 2017-06-18 LAB — GLUCOSE, CAPILLARY: Glucose-Capillary: 103 mg/dL — ABNORMAL HIGH (ref 65–99)

## 2017-06-18 LAB — URINALYSIS, COMPLETE (UACMP) WITH MICROSCOPIC
Bacteria, UA: NONE SEEN
Bilirubin Urine: NEGATIVE
GLUCOSE, UA: NEGATIVE mg/dL
KETONES UR: NEGATIVE mg/dL
LEUKOCYTES UA: NEGATIVE
NITRITE: NEGATIVE
PH: 5 (ref 5.0–8.0)
Protein, ur: NEGATIVE mg/dL
Specific Gravity, Urine: 1.008 (ref 1.005–1.030)
Squamous Epithelial / LPF: NONE SEEN (ref 0–5)

## 2017-06-18 LAB — MRSA PCR SCREENING: MRSA by PCR: NEGATIVE

## 2017-06-18 LAB — TROPONIN I: TROPONIN I: 0.05 ng/mL — AB (ref <0.03)

## 2017-06-18 MED ORDER — ACETAMINOPHEN 325 MG PO TABS: 650.0000 mg | ORAL_TABLET | Freq: Four times a day (QID) | ORAL | Status: DC | PRN

## 2017-06-18 MED ORDER — IBUPROFEN 400 MG PO TABS
200.0000 mg | ORAL_TABLET | ORAL | Status: DC | PRN
  Administered 2017-06-18 – 2017-06-21 (×5): 400 mg via ORAL
  Filled 2017-06-18 (×5): qty 1

## 2017-06-18 MED ORDER — SODIUM CHLORIDE 0.9 % IV SOLN
INTRAVENOUS | Status: DC
  Administered 2017-06-18 – 2017-06-22 (×4): via INTRAVENOUS

## 2017-06-18 MED ORDER — ALBUTEROL SULFATE (2.5 MG/3ML) 0.083% IN NEBU
2.5000 mg | INHALATION_SOLUTION | RESPIRATORY_TRACT | Status: DC | PRN
  Filled 2017-06-18: qty 3

## 2017-06-18 MED ORDER — ALPRAZOLAM 0.25 MG PO TABS
0.2500 mg | ORAL_TABLET | Freq: Two times a day (BID) | ORAL | Status: DC | PRN
  Administered 2017-06-18 – 2017-06-20 (×4): 0.25 mg via ORAL
  Filled 2017-06-18 (×4): qty 1

## 2017-06-18 MED ORDER — MIRTAZAPINE 15 MG PO TABS
30.0000 mg | ORAL_TABLET | Freq: Every day | ORAL | Status: DC
  Administered 2017-06-18 – 2017-06-21 (×4): 30 mg via ORAL
  Filled 2017-06-18 (×4): qty 2

## 2017-06-18 MED ORDER — ACETAMINOPHEN 650 MG RE SUPP: 650.0000 mg | Freq: Four times a day (QID) | RECTAL | Status: DC | PRN

## 2017-06-18 MED ORDER — POLYETHYLENE GLYCOL 3350 17 G PO PACK: 17.0000 g | PACK | Freq: Every day | ORAL | Status: DC | PRN

## 2017-06-18 MED ORDER — ALBUTEROL SULFATE (2.5 MG/3ML) 0.083% IN NEBU
2.5000 mg | INHALATION_SOLUTION | Freq: Once | RESPIRATORY_TRACT | Status: AC
  Administered 2017-06-18: 2.5 mg via RESPIRATORY_TRACT
  Filled 2017-06-18: qty 3

## 2017-06-18 MED ORDER — MIRTAZAPINE 15 MG PO TABS: 30.0000 mg | ORAL_TABLET | Freq: Every day | ORAL | Status: DC

## 2017-06-18 MED ORDER — ENOXAPARIN SODIUM 40 MG/0.4ML ~~LOC~~ SOLN
40.0000 mg | SUBCUTANEOUS | Status: DC
  Administered 2017-06-18 – 2017-06-20 (×3): 40 mg via SUBCUTANEOUS
  Filled 2017-06-18 (×4): qty 0.4

## 2017-06-18 MED ORDER — METHYLPREDNISOLONE SODIUM SUCC 125 MG IJ SOLR
125.0000 mg | Freq: Once | INTRAMUSCULAR | Status: AC
  Administered 2017-06-18: 125 mg via INTRAVENOUS
  Filled 2017-06-18: qty 2

## 2017-06-18 MED ORDER — FAMOTIDINE IN NACL 20-0.9 MG/50ML-% IV SOLN
20.0000 mg | INTRAVENOUS | Status: DC
  Administered 2017-06-18 – 2017-06-20 (×3): 20 mg via INTRAVENOUS
  Filled 2017-06-18 (×3): qty 50

## 2017-06-18 MED ORDER — SODIUM CHLORIDE 0.9 % IV SOLN
500.0000 mg | INTRAVENOUS | Status: DC
  Administered 2017-06-18 – 2017-06-20 (×3): 500 mg via INTRAVENOUS
  Filled 2017-06-18 (×5): qty 500

## 2017-06-18 MED ORDER — ONDANSETRON HCL 4 MG/2ML IJ SOLN: 4.0000 mg | Freq: Four times a day (QID) | INTRAMUSCULAR | Status: DC | PRN

## 2017-06-18 MED ORDER — METHYLPREDNISOLONE SODIUM SUCC 125 MG IJ SOLR
60.0000 mg | INTRAMUSCULAR | Status: DC
  Administered 2017-06-18: 60 mg via INTRAVENOUS
  Filled 2017-06-18: qty 2

## 2017-06-18 MED ORDER — ONDANSETRON HCL 4 MG PO TABS: 4.0000 mg | ORAL_TABLET | Freq: Four times a day (QID) | ORAL | Status: DC | PRN

## 2017-06-18 MED ORDER — SODIUM CHLORIDE 0.9 % IV SOLN: INTRAVENOUS | Status: DC

## 2017-06-18 MED ORDER — PIPERACILLIN-TAZOBACTAM 3.375 G IVPB
3.3750 g | Freq: Three times a day (TID) | INTRAVENOUS | Status: DC
  Administered 2017-06-18 – 2017-06-20 (×6): 3.375 g via INTRAVENOUS
  Filled 2017-06-18 (×6): qty 50

## 2017-06-18 MED ORDER — SODIUM CHLORIDE 0.9% FLUSH
3.0000 mL | Freq: Two times a day (BID) | INTRAVENOUS | Status: DC
  Administered 2017-06-18 – 2017-06-21 (×8): 3 mL via INTRAVENOUS

## 2017-06-18 NOTE — ED Notes (Signed)
Date and time results received: 06/18/17 1149 (use smartphrase ".now" to insert current time)  Test: troponin Critical Value: 0.05  Name of Provider Notified: Kinner MD  Orders Received? Or Actions Taken?: Orders Received - See Orders for details

## 2017-06-18 NOTE — H&P (Signed)
[facility] [place] NAME: [name]    MR#:  [id number]  DATE OF BIRTH:  31-Aug-1933  DATE OF ADMISSION:  06/18/2017  PRIMARY CARE PHYSICIAN: Idelle Crouch, MD   REQUESTING/REFERRING PHYSICIAN: Dr. Corky Downs  CHIEF COMPLAINT:   Chief Complaint  Patient presents with  . Fall  . Weakness    HISTORY OF PRESENT ILLNESS:  [name]  is a 84 y.o. female with a known history of CAD, end-stage COPD, anxiety, recent lumbar spine compression fracture presents to the emergency room due to recurrent falls, weakness, confusion.  Patient had lumbar spinal compression fracture 1 month back and was started on oxycodone.  She did not do well with that and was slowly tapered off it.  Started on Klonopin.  Presently her pain is much improved. Patient was brought to the emergency room due to weakness and confusion.  Here her venous blood gas showed pH of 7.26 with PCO2 106.  Patient needs admission and BiPAP support.  COPD exacerbation.  History obtained from reviewing old charts and discussing with family.  Patient unable to contribute to history.  PAST MEDICAL HISTORY:   Past Medical History:  Diagnosis Date  . Anxiety   . Cancer ([place])    lung, chemo  . COPD (chronic obstructive pulmonary disease) ([place])   . Coronary artery disease   . GERD (gastroesophageal reflux disease)   . Hyperlipidemia   . Lower extremity edema   . Lung disease   . Myocardial infarction ([place])   . Ovarian cyst 07/30/2014  . Past heart attack    "stress"- per pt has had 2.  . Personal history of chemotherapy   . Shortness of breath     PAST SURGICAL HISTORY:   Past Surgical History:  Procedure Laterality Date  . BREAST BIOPSY Left 11-2012   neg., papilloma  . CHOLECYSTECTOMY  2014  . LUNG SURGERY  2007   upper left lobe  . TONSILLECTOMY      SOCIAL HISTORY:   Social History   Tobacco Use  . Smoking status: Former Smoker    Packs/day: 1.50    Years:  40.00    Pack years: 60.00    Types: Cigarettes    Last attempt to quit: 10/01/2001    Years since quitting: 15.7  . Smokeless tobacco: Current User  Substance Use Topics  . Alcohol use: Yes    Alcohol/week: 4.2 oz    Types: 7 Glasses of wine per week    Comment: socially    FAMILY HISTORY:   Family History  Problem Relation Age of Onset  . Cancer Daughter        breast  . Breast cancer Daughter 23  . Aneurysm Mother        brain  . Hyperlipidemia Mother   . Thyroid disease Mother   . Heart failure Father   . Hyperlipidemia Father   . Scleroderma Sister   . COPD Sister   . Hyperlipidemia Sister   . Thyroid disease Sister   . Hyperlipidemia Brother   . Heart attack Brother   . Healthy Sister   . Alcohol abuse Brother     DRUG ALLERGIES:   Allergies  Allergen Reactions  . Morphine And Related Other (See Comments)    halluncinations  . Sulfa Antibiotics Itching    redness  . Sulfonamide Derivatives Itching  . Tetanus Toxoid Itching and Swelling    REVIEW OF SYSTEMS:   Review of Systems  Unable to perform ROS: Mental status change    MEDICATIONS AT HOME:   Prior to Admission medications   Medication Sig Start Date End Date Taking? Authorizing Provider  ADVAIR DISKUS 250-50 MCG/DOSE AEPB INHALE 1 PUFF TWICE DAILY.  RINSE MOUTH AFTER EACH USE Patient taking differently: Inhale 1 puff by mouth twice daily - rinse mouth after use 03/24/15  Yes Herring, Orville Govern, NP  albuterol (PROVENTIL HFA;VENTOLIN HFA) 108 (90 Base) MCG/ACT inhaler Inhale 2 puffs into the lungs every 6 (six) hours as needed for wheezing or shortness of breath.   Yes [provider]  atorvastatin (LIPITOR) 20 MG tablet Take 20 mg by mouth daily with supper.  04/22/15  Yes [provider]  calcitonin, salmon, (MIACALCIN/FORTICAL) 200 UNIT/ACT nasal spray Place 1 spray into alternate nostrils daily. 06/15/17  Yes [provider]  Calcium Carb-Cholecalciferol (CALCIUM 600 + D  PO) Take 1 tablet by mouth daily.   Yes [provider]  clonazePAM (KLONOPIN) 1 MG tablet Take 1 tablet by mouth 3 (three) times daily. 06/16/17  Yes [provider]  escitalopram (LEXAPRO) 10 MG tablet Take 10 mg by mouth 2 (two) times daily.    Yes [provider]  furosemide (LASIX) 40 MG tablet Take 40 mg by mouth daily.    Yes [provider]  ibuprofen (ADVIL,MOTRIN) 200 MG tablet Take 200-400 mg by mouth every 4 (four) hours as needed for mild pain or moderate pain.   Yes [provider]  megestrol (MEGACE) 400 MG/10ML suspension Take 400 mg daily by mouth.   Yes [provider]  mirtazapine (REMERON) 30 MG tablet Take 30 mg by mouth daily with supper.  05/14/13  Yes [provider]  Multiple Vitamin (MULTIVITAMIN WITH MINERALS) TABS tablet Take 1 tablet by mouth daily with supper. CENTRUM SILVER 50+   Yes [provider]  Naphazoline HCl (CLEAR EYES OP) Place 1-2 drops into both eyes 3 (three) times daily as needed (FOR DRY EYES).   Yes [provider]  omeprazole (PRILOSEC) 40 MG capsule Take 40 mg by mouth daily before breakfast.  04/30/13  Yes [provider]  senna-docusate (SENOKOT-S) 8.6-50 MG tablet Take 1 tablet by mouth daily with supper.    Yes [provider]  sodium chloride (OCEAN) 0.65 % SOLN nasal spray Place 1 spray into both nostrils 4 (four) times daily as needed for congestion.   Yes [provider]  SPIRIVA HANDIHALER 18 MCG inhalation capsule Place 18 mcg into inhaler and inhale daily.  11/01/12  Yes [provider]     VITAL SIGNS:  Blood pressure (!) 92/57, pulse 90, temperature 97.9 F (36.6 C), temperature source Oral, resp. rate 17, height 5\' 2"  (1.575 m), weight 54.4 kg (120 lb), SpO2 90 %.  PHYSICAL EXAMINATION:  Physical Exam  GENERAL:  84 y.o.-year-old patient lying in the bed .  Looks critically ill.  Frail EYES: Pupils equal, round, reactive  to light and accommodation. No scleral icterus. Extraocular muscles intact.  HEENT: Head atraumatic, normocephalic. Oropharynx and nasopharynx clear. No oropharyngeal erythema, moist oral mucosa  NECK:  Supple, no jugular venous distention. No thyroid enlargement, no tenderness.  LUNGS: Decreased air entry bilaterally.  No wheezing CARDIOVASCULAR: S1, S2 normal. No murmurs, rubs, or gallops.  ABDOMEN: Soft, nontender, nondistended. Bowel sounds present. No organomegaly or mass.  EXTREMITIES: No pedal edema, cyanosis, or clubbing. + 2 pedal & radial pulses b/l.   NEUROLOGIC: Not following instructions PSYCHIATRIC: The patient is  drowsy SKIN: No obvious rash, lesion, or ulcer.   LABORATORY PANEL:   CBC Recent Labs  Lab 06/18/17 1106  WBC 13.3*  HGB 11.9*  HCT 35.4  PLT 558*   ------------------------------------------------------------------------------------------------------------------  Chemistries  Recent Labs  Lab 06/18/17 1106  NA 131*  K 3.5  CL 84*  CO2 39*  GLUCOSE 144*  BUN 15  CREATININE 0.77  CALCIUM 8.5*  AST 35  ALT 30  ALKPHOS 84  BILITOT 0.4   ------------------------------------------------------------------------------------------------------------------  Cardiac Enzymes Recent Labs  Lab 06/18/17 1106  TROPONINI 0.05*   ------------------------------------------------------------------------------------------------------------------  RADIOLOGY:  Dg Pelvis 1-2 Views  Result Date: 06/18/2017 CLINICAL DATA:  84 year old female with a history of fall EXAM: PELVIS - 1-2 VIEW COMPARISON:  None. FINDINGS: Bony pelvic ring is intact with no evidence of acute displaced fracture. Degenerative changes of the bilateral hips. Proximal femurs unremarkable. Hips projects normally over the acetabula. Vascular calcifications. IMPRESSION: Negative for acute bony abnormality. Electronically Signed   By: Corrie Mckusick D.O.   On: 06/18/2017 11:46   Dg Chest Portable  1 View  Result Date: 06/18/2017 CLINICAL DATA:  Dyspnea EXAM: PORTABLE CHEST 1 VIEW COMPARISON:  05/18/2017 chest radiograph. FINDINGS: Surgical suture line overlies the left lung apex. Stable cardiomediastinal silhouette with normal heart size. No pneumothorax. No pleural effusion. No pulmonary edema. Hyperinflated lungs with emphysema. Scattered mild scarring throughout both lungs, unchanged. No acute consolidative airspace disease. IMPRESSION: 1. No acute cardiopulmonary disease. Stable scattered scarring in both lungs with postsurgical changes at the left lung apex. 2. Hyperinflated lungs with emphysema, suggesting COPD. Electronically Signed   By: Ilona Sorrel M.D.   On: 06/18/2017 11:46   Dg Knee Complete 4 Views Right  Result Date: 06/18/2017 CLINICAL DATA:  84 year old female with a history of fall EXAM: RIGHT KNEE - COMPLETE 4+ VIEW COMPARISON:  None. FINDINGS: Osteopenia. No acute displaced fracture identified. Mild degenerative changes of the knee. Soft tissue swelling anterior to the patella. IMPRESSION: No acute bony abnormality. Significant prepatellar soft tissue swelling. Electronically Signed   By: Corrie Mckusick D.O.   On: 06/18/2017 11:45     IMPRESSION AND PLAN:   *Acute on chronic hypercapnic respiratory failure due to COPD exacerbation.  Will start IV steroids, nebulizers.  BiPAP support.  Discussed with ICU attending.  Patient will be admitted to ICU.  Wean off BiPAP as tolerated.  *Recent compression fracture.  Pain is much improved.  She is following up with the spine surgeon at [facility] for possible kyphoplasty if pain does not improve. High risk for surgery.  *CAD.  Stable.  Troponin 0 0.05.  No significant elevation.  DVT prophylaxis with Lovenox  All the records are reviewed and case discussed with ED provider. Management plans discussed with the patient, family and they are in agreement.  CODE STATUS: Full code  TOTAL CC TIME TAKING CARE OF THIS PATIENT: 40 minutes.    Leia Alf Quintasha Gren M.D on 06/18/2017 at 1:20 PM  Between 7am to 6pm - Pager - [phone]  After 6pm go to [email or web address] - password [email or web address] [facility]  Office  [phone]  CC: Primary care physician; Idelle Crouch, MD  Note: This dictation was prepared with Dragon dictation along with smaller phrase technology. Any transcriptional errors that result from this process are unintentional.

## 2017-06-18 NOTE — Progress Notes (Signed)
Pharmacy Antibiotic Note  [name] is a 84 y.o. female admitted on 06/18/2017 with suspected community acquired pneumonia.  Pharmacy has been consulted for Zosyn dosing.  Plan: Zosyn 3.375g IV q8h (4 hour infusion).  Height: 5\' 2"  (157.5 cm) Weight: 119 lb 11.4 oz (54.3 kg) IBW/kg (Calculated) : 50.1  Temp (24hrs), Avg:97.5 F (36.4 C), Min:97 F (36.1 C), Max:97.9 F (36.6 C)  Recent Labs  Lab 06/18/17 1106  WBC 13.3*  CREATININE 0.77    Estimated Creatinine Clearance: 43.6 mL/min (by C-G formula based on SCr of 0.77 mg/dL).    Allergies  Allergen Reactions  . Morphine And Related Other (See Comments)    halluncinations  . Sulfa Antibiotics Itching    redness  . Sulfonamide Derivatives Itching  . Tetanus Toxoid Itching and Swelling    Antimicrobials this admission: Zosyn 5/4 >>   Dose adjustments this admission:   Microbiology results: MRSA PCR: 5/4 >> NEGATIVE   Thank you for allowing pharmacy to be a part of this patient's care.  Lendon Ka, PharmD Pharmacy Resident 06/18/2017 4:49 PM

## 2017-06-18 NOTE — ED Notes (Signed)
Family at bedside. Family updated on plan of care.

## 2017-06-18 NOTE — ED Triage Notes (Signed)
Fell x 2 at least at home. States she lives alone at home. Weak, HOH, does answer questions when spoken to loudly. Denies chest pain, states SOB.

## 2017-06-18 NOTE — ED Provider Notes (Signed)
[facility] Emergency Department Provider Note   ____________________________________________    I have reviewed the triage vital signs and the nursing notes.   HISTORY  Chief Complaint Fall and Weakness   History limited by confusion.Marland Kitchen    HPI [name] is a 84 y.o. female with a history of COPD as noted below who presents with weakness and fall and likely altered mental status.  Per EMS patient is more confused than typical, lives alone at home, more short of breath than usual, has a history of COPD.  Several falls over the last couple of weeks.  Family is concerned.  Past Medical History:  Diagnosis Date  . Anxiety   . Cancer ([place])    lung, chemo  . COPD (chronic obstructive pulmonary disease) ([place])   . Coronary artery disease   . GERD (gastroesophageal reflux disease)   . Hyperlipidemia   . Lower extremity edema   . Lung disease   . Myocardial infarction ([place])   . Ovarian cyst 07/30/2014  . Past heart attack    "stress"- per pt has had 2.  . Personal history of chemotherapy   . Shortness of breath     Patient Active Problem List   Diagnosis Date Noted  . PNA (pneumonia) 02/22/2017  . Aortic atherosclerosis ([place]) 05/20/2016  . Pulmonary nodules/lesions, multiple 12/18/2014  . Ovarian cyst 07/30/2014  . Nipple discharge 02/15/2014  . Anxiety and depression 07/24/2013  . Arterial vascular disease 07/24/2013  . Insomnia, persistent 07/24/2013  . Acid reflux 07/24/2013  . HLD (hyperlipidemia) 07/24/2013  . Cancer of upper lobe of left lung ([place]) 07/24/2013  . Peripheral blood vessel disorder ([place]) 07/24/2013  . Papilloma of left breast 11/22/2012  . CAFL (chronic airflow limitation) ([place]) 03/09/2012  . ALLERGIC RHINITIS 06/01/2007  . EMPHYSEMA 06/01/2007  . ASTHMA 06/01/2007  . DYSPNEA 06/01/2007    Past Surgical History:  Procedure Laterality Date  . BREAST BIOPSY Left 11-2012   neg., papilloma  . CHOLECYSTECTOMY  2014    . LUNG SURGERY  2007   upper left lobe  . TONSILLECTOMY      Prior to Admission medications   Medication Sig Start Date End Date Taking? Authorizing Provider  ADVAIR DISKUS 250-50 MCG/DOSE AEPB INHALE 1 PUFF TWICE DAILY.  RINSE MOUTH AFTER EACH USE Patient taking differently: INHALE 1 PUFF DAILY AT SUPPER.  RINSE MOUTH AFTER EACH USE 03/24/15   Herring, Orville Govern, NP  albuterol (PROVENTIL HFA;VENTOLIN HFA) 108 (90 Base) MCG/ACT inhaler Inhale 2 puffs into the lungs every 6 (six) hours as needed for wheezing or shortness of breath.    [provider]  aspirin 81 MG tablet Take 81 mg by mouth daily with supper.     [provider]  atorvastatin (LIPITOR) 20 MG tablet Take 20 mg by mouth daily with supper.  04/22/15   [provider]  Calcium Carb-Cholecalciferol (CALCIUM 600 + D PO) Take 1 tablet by mouth daily.    [provider]  cholecalciferol (VITAMIN D) 1000 UNITS tablet Take 1,000 Units by mouth daily.    [provider]  escitalopram (LEXAPRO) 10 MG tablet Take 10 mg by mouth daily.  08/20/15   [provider]  furosemide (LASIX) 20 MG tablet Take 40 mg by mouth daily.     [provider]  LORazepam (ATIVAN) 1 MG tablet Take 0.5 mg by mouth 3 (three) times daily.  11/15/12   [provider]  megestrol (MEGACE) 400  MG/10ML suspension Take 400 mg daily by mouth.    [provider]  mirtazapine (REMERON) 30 MG tablet Take 30 mg by mouth daily with supper.  05/14/13   [provider]  Multiple Vitamin (MULTIVITAMIN WITH MINERALS) TABS tablet Take 1 tablet by mouth daily with supper. CENTRUM SILVER 50+    [provider]  Naphazoline HCl (CLEAR EYES OP) Place 1-2 drops into both eyes 3 (three) times daily as needed (FOR DRY EYES).    [provider]  omeprazole (PRILOSEC) 40 MG capsule Take 40 mg by mouth daily before breakfast.  04/30/13   [provider]  oxyCODONE-acetaminophen  (PERCOCET) 10-325 MG tablet Take 1 tablet by mouth every 6 (six) hours as needed. 05/20/17   [provider]  OXYGEN Inhale 2 L into the lungs continuous.     [provider]  senna-docusate (SENOKOT-S) 8.6-50 MG tablet Take 1 tablet by mouth daily with supper.     [provider]  sodium chloride (OCEAN) 0.65 % SOLN nasal spray Place 1 spray into both nostrils 4 (four) times daily as needed for congestion.    [provider]  SPIRIVA HANDIHALER 18 MCG inhalation capsule Place 18 mcg into inhaler and inhale daily.  11/01/12   [provider]     Allergies Morphine and related; Sulfa antibiotics; Sulfonamide derivatives; and Tetanus toxoid  Family History  Problem Relation Age of Onset  . Cancer Daughter        breast  . Breast cancer Daughter 99  . Aneurysm Mother        brain  . Hyperlipidemia Mother   . Thyroid disease Mother   . Heart failure Father   . Hyperlipidemia Father   . Scleroderma Sister   . COPD Sister   . Hyperlipidemia Sister   . Thyroid disease Sister   . Hyperlipidemia Brother   . Heart attack Brother   . Healthy Sister   . Alcohol abuse Brother     Social History Social History   Tobacco Use  . Smoking status: Former Smoker    Packs/day: 1.50    Years: 40.00    Pack years: 60.00    Types: Cigarettes    Last attempt to quit: 10/01/2001    Years since quitting: 15.7  . Smokeless tobacco: Current User  Substance Use Topics  . Alcohol use: Yes    Alcohol/week: 4.2 oz    Types: 7 Glasses of wine per week    Comment: socially  . Drug use: No    Review of Systems  Constitutional: Patient denies fever Eyes: No visual changes.  ENT: Denies swelling of the throat Cardiovascular: Denies chest pain. Respiratory: Breathing is worse than usual Gastrointestinal: Positive nausea.   Genitourinary: foul smelling urine Musculoskeletal: Negative for back pain. Skin: Negative for rash. Neurological: Denies focal  weakness   ____________________________________________   PHYSICAL EXAM:  VITAL SIGNS: ED Triage Vitals  Enc Vitals Group     BP 06/18/17 1102 (!) 137/59     Pulse Rate 06/18/17 1102 85     Resp 06/18/17 1102 (!) 22     Temp 06/18/17 1102 97.9 F (36.6 C)     Temp Source 06/18/17 1102 Oral     SpO2 06/18/17 1102 96 %     Weight 06/18/17 1104 54.4 kg (120 lb)     Height 06/18/17 1104 1.575 m (5\' 2" )     Head Circumference --      Peak Flow --  Pain Score 06/18/17 1132 0     Pain Loc --      Pain Edu? --      Excl. in [place]? --     Constitutional: Alert but confused, increased work of breathing noted Eyes: Conjunctivae are normal.  Head: Atraumatic. Nose: No congestion/rhinnorhea. Mouth/Throat: Mucous membranes are dry Neck:  Painless ROM Cardiovascular: Normal rate, regular rhythm. Grossly normal heart sounds.  Good peripheral circulation. Respiratory: Increased respiratory effort with tachypnea no retractions.  Scattered wheezes, poor to moderate airflow Gastrointestinal: Soft and nontender. No distention.  No CVA tenderness. Genitourinary: deferred Musculoskeletal: Significant swelling to right knee with minor abrasion.  Warm and well perfused extremities.  No other bony abnormalities found.  No pain with axial load on both hips.  No vertebral tenderness palpation Neurologic:  Normal speech and language. No gross focal neurologic deficits are appreciated.  Skin:  Skin is warm, dry and intact. No rash noted.   ____________________________________________   LABS (all labs ordered are listed, but only abnormal results are displayed)  Labs Reviewed  CBC - Abnormal; Notable for the following components:      Result Value   WBC 13.3 (*)    Hemoglobin 11.9 (*)    Platelets 558 (*)    All other components within normal limits  COMPREHENSIVE METABOLIC PANEL - Abnormal; Notable for the following components:   Sodium 131 (*)    Chloride 84 (*)    CO2 39 (*)     Glucose, Bld 144 (*)    Calcium 8.5 (*)    Albumin 3.4 (*)    All other components within normal limits  TROPONIN I - Abnormal; Notable for the following components:   Troponin I 0.05 (*)    All other components within normal limits  BLOOD GAS, VENOUS - Abnormal; Notable for the following components:   pCO2, Ven 108 (*)    Bicarbonate 48.5 (*)    Acid-Base Excess 16.6 (*)    All other components within normal limits  URINALYSIS, COMPLETE (UACMP) WITH MICROSCOPIC   ____________________________________________  EKG  ED ECG REPORT I, Lavonia Drafts, the attending physician, personally viewed and interpreted this ECG.  Date: 06/18/2017  Rhythm: normal sinus rhythm QRS Axis: normal Intervals: normal ST/T Wave abnormalities: normal Narrative Interpretation: no evidence of acute ischemia  ____________________________________________  RADIOLOGY  Chest x-ray no pneumonia ____________________________________________   PROCEDURES  Procedure(s) performed: No  Procedures   Critical Care performed: yes  CRITICAL CARE Performed by: Lavonia Drafts   Total critical care time: 30 minutes  Critical care time was exclusive of separately billable procedures and treating other patients.  Critical care was necessary to treat or prevent imminent or life-threatening deterioration.  Critical care was time spent personally by me on the following activities: development of treatment plan with patient and/or surrogate as well as nursing, discussions with consultants, evaluation of patient's response to treatment, examination of patient, obtaining history from patient or surrogate, ordering and performing treatments and interventions, ordering and review of laboratory studies, ordering and review of radiographic studies, pulse oximetry and re-evaluation of patient's condition.  ____________________________________________   INITIAL IMPRESSION / ASSESSMENT AND PLAN / ED  COURSE  Pertinent labs & imaging results that were available during my care of the patient were reviewed by me and considered in my medical decision making (see chart for details).  Patient presents with shortness of breath, confusion.  She is unable to give significant history.  Will check labs including VBG, chest x-ray, x-rays of  the right knee and pelvis  CXR reassuring, no pneumonia.   Notified of CO2 of 108, patient started on BiPAP.  Is likely the cause of her confusion, will give nebs via BiPAP as well.  Will admit to the hospital service    ____________________________________________   FINAL CLINICAL IMPRESSION(S) / ED DIAGNOSES  Final diagnoses:  Acute respiratory failure with hypoxia and hypercapnia ([place])        Note:  This document was prepared using Dragon voice recognition software and may include unintentional dictation errors.    Lavonia Drafts, MD 06/18/17 1220

## 2017-06-18 NOTE — ED Notes (Signed)
Attempted to call report, Per Pam RN she will call back.

## 2017-06-18 NOTE — Consult Note (Signed)
Name: [name] MRN: [id number] DOB: [birth year]     CONSULTATION DATE: 06/18/2017    HISTORY OF PRESENT ILLNESS:   84 years old lady with a history of lung CA status post lobectomy, remote tobacco abuse, COPD, CAD, dyslipidemia, anxiety, and recent history of falls spinal compression was vertebral fractures.  Patient presented to the emergency room with progressive weakness, declining general status and confusion.  She was found to have acute exacerbation of COPD with acute on chronic respiratory failure baseline home O2 2 L/min.  Patient will be started on a BiPAP. All history was obtained from admitting physician, family at the bedside and EMR Patient is currently in ICU, on BiPAP, no distress, somnolent and confused.  PAST MEDICAL HISTORY :   has a past medical history of Anxiety, Cancer ([place]), COPD (chronic obstructive pulmonary disease) ([place]), Coronary artery disease, GERD (gastroesophageal reflux disease), Hyperlipidemia, Lower extremity edema, Lung disease, Myocardial infarction [facility]), Ovarian cyst (07/30/2014), Past heart attack, Personal history of chemotherapy, and Shortness of breath.  has a past surgical history that includes Cholecystectomy (2014); Lung surgery (2007); Tonsillectomy; and Breast biopsy (Left, 11-2012). Prior to Admission medications   Medication Sig Start Date End Date Taking? Authorizing Provider  ADVAIR DISKUS 250-50 MCG/DOSE AEPB INHALE 1 PUFF TWICE DAILY.  RINSE MOUTH AFTER EACH USE Patient taking differently: Inhale 1 puff by mouth twice daily - rinse mouth after use 03/24/15  Yes Herring, Orville Govern, NP  albuterol (PROVENTIL HFA;VENTOLIN HFA) 108 (90 Base) MCG/ACT inhaler Inhale 2 puffs into the lungs every 6 (six) hours as needed for wheezing or shortness of breath.   Yes [provider]  atorvastatin (LIPITOR) 20 MG tablet Take 20 mg by mouth daily with supper.  04/22/15  Yes [provider]  calcitonin, salmon, (MIACALCIN/FORTICAL) 200  UNIT/ACT nasal spray Place 1 spray into alternate nostrils daily. 06/15/17  Yes [provider]  Calcium Carb-Cholecalciferol (CALCIUM 600 + D PO) Take 1 tablet by mouth daily.   Yes [provider]  clonazePAM (KLONOPIN) 1 MG tablet Take 1 tablet by mouth 3 (three) times daily. 06/16/17  Yes [provider]  escitalopram (LEXAPRO) 10 MG tablet Take 10 mg by mouth 2 (two) times daily.    Yes [provider]  furosemide (LASIX) 40 MG tablet Take 40 mg by mouth daily.    Yes [provider]  ibuprofen (ADVIL,MOTRIN) 200 MG tablet Take 200-400 mg by mouth every 4 (four) hours as needed for mild pain or moderate pain.   Yes [provider]  megestrol (MEGACE) 400 MG/10ML suspension Take 400 mg daily by mouth.   Yes [provider]  mirtazapine (REMERON) 30 MG tablet Take 30 mg by mouth daily with supper.  05/14/13  Yes [provider]  Multiple Vitamin (MULTIVITAMIN WITH MINERALS) TABS tablet Take 1 tablet by mouth daily with supper. CENTRUM SILVER 50+   Yes [provider]  Naphazoline HCl (CLEAR EYES OP) Place 1-2 drops into both eyes 3 (three) times daily as needed (FOR DRY EYES).   Yes [provider]  omeprazole (PRILOSEC) 40 MG capsule Take 40 mg by mouth daily before breakfast.  04/30/13  Yes [provider]  senna-docusate (SENOKOT-S) 8.6-50 MG tablet Take 1 tablet by mouth daily with supper.    Yes [provider]  SPIRIVA HANDIHALER 18 MCG inhalation capsule Place 18 mcg into inhaler and inhale daily.  11/01/12  Yes [provider]  sodium chloride (OCEAN) 0.65 % SOLN nasal  spray Place 1 spray into both nostrils 4 (four) times daily as needed for congestion.    [provider]   Allergies  Allergen Reactions  . Morphine And Related Other (See Comments)    halluncinations  . Sulfa Antibiotics Itching    redness  . Sulfonamide Derivatives Itching  . Tetanus Toxoid Itching  and Swelling    FAMILY HISTORY:  family history includes Alcohol abuse in her brother; Aneurysm in her mother; Breast cancer (age of onset: 40) in her daughter; COPD in her sister; Cancer in her daughter; Healthy in her sister; Heart attack in her brother; Heart failure in her father; Hyperlipidemia in her brother, father, mother, and sister; Scleroderma in her sister; Thyroid disease in her mother and sister. SOCIAL HISTORY:  reports that she quit smoking about 15 years ago. Her smoking use included cigarettes. She has a 60.00 pack-year smoking history. She uses smokeless tobacco. She reports that she drinks about 4.2 oz of alcohol per week. She reports that she does not use drugs.  REVIEW OF SYSTEMS:   Unable to obtain due to critical illness   VITAL SIGNS: Temp:  [97 F (36.1 C)-97.9 F (36.6 C)] 97 F (36.1 C) (05/04 1503) Pulse Rate:  [79-90] 88 (05/04 1503) Resp:  [15-28] 28 (05/04 1503) BP: (92-137)/(48-68) 111/68 (05/04 1503) SpO2:  [90 %-100 %] 95 % (05/04 1503) FiO2 (%):  [30 %] 30 % (05/04 1503) Weight:  [54.3 kg (119 lb 11.4 oz)-54.4 kg (120 lb)] 54.3 kg (119 lb 11.4 oz) (05/04 1503)  Physical Examination:  Somnolent, verbally arousable, confused, following commands, no focal motor deficits. On BiPAP, no distress, able to talk, BEAE and no rales S1 & S2 audible and no murmur Benign abdomen with normal peristalses Wasted extremities, bil. Leg edema, large Rt. prepatellar hematoma, dorsal aspect of Lt. Foot in addition to multiple bruises both LE as per family due to frequent falls recently   ASSESSMENT / PLAN:  Acute on chronic respiratory failure with baseline home O2 2 L/min nasal cannula.  Tolerating BiPAP -Monitor ABG and optimize BiPAP settings -As discussed with the family at the bedside the patient wishes not to be intubated.  Altered mental status with possible CO2 narcosis.  -CT head to r/o CVA / intracranial abnormality  Acute exacerbation of  COPD -Bronchodilators, ABX and consider steroids if continues to have active airway disease  Atelectasis and possible pneumonia was infective etiology cannot be ruled out.  Bibasilar airspace disease -Zosyn + Zithro. Monitor CXR + CBC + FIO2.  Hyponatremia, hypochloremia, contraction alkalosis was intravascular volume depletion -Optimize hydration, monitor renal panel and urine output  Spinal compression fractures following up with the spinal surgery at [facility] for possible kyphoplasty. -Optimize analgesia, avoid using narcotics.  CAD.  No chest pain, mild elevation cardiac enzymes and no acute ST changes on EKG, possibly due to demand versus supply mismatch -Monitor cardiac enzymes -Echo and consider cardiology consult.  Multiple bruises on lower extremities due to frequent falls. -Fall precautions.  Bil. Pedal edema -Venous Doppler to r/o DVT  Anemia -Keep Hb more than 7 g/dL  CODE STATUS was addressed with the family at the bedside the patient does not like to be intubated however he remains a full code, okay with CPR, okay with defibrillation and chemical code.  DVT & GI prophylaxis.  Continue with supportive care  Plan of care was discussed with the family at the bedside Critical care time 50 minutes

## 2017-06-19 ENCOUNTER — Inpatient Hospital Stay: Payer: PPO

## 2017-06-19 ENCOUNTER — Inpatient Hospital Stay (HOSPITAL_COMMUNITY)
Admit: 2017-06-19 | Discharge: 2017-06-19 | Disposition: A | Discharge status: Home / Self Care | Payer: PPO | Attending: Internal Medicine | Admitting: Internal Medicine

## 2017-06-19 DIAGNOSIS — I351 Nonrheumatic aortic (valve) insufficiency: Secondary | ICD-10-CM

## 2017-06-19 LAB — CBC
HEMATOCRIT: 32.4 % — AB (ref 35.0–47.0)
HEMOGLOBIN: 10.8 g/dL — AB (ref 12.0–16.0)
MCH: 30.8 pg (ref 26.0–34.0)
MCHC: 33.4 g/dL (ref 32.0–36.0)
MCV: 92 fL (ref 80.0–100.0)
Platelets: 474 10*3/uL — ABNORMAL HIGH (ref 150–440)
RBC: 3.52 MIL/uL — ABNORMAL LOW (ref 3.80–5.20)
RDW: 13.3 % (ref 11.5–14.5)
WBC: 7.3 10*3/uL (ref 3.6–11.0)

## 2017-06-19 LAB — BASIC METABOLIC PANEL
ANION GAP: 8 (ref 5–15)
BUN: 11 mg/dL (ref 6–20)
CO2: 39 mmol/L — AB (ref 22–32)
Calcium: 8.2 mg/dL — ABNORMAL LOW (ref 8.9–10.3)
Chloride: 88 mmol/L — ABNORMAL LOW (ref 101–111)
Creatinine, Ser: 0.68 mg/dL (ref 0.44–1.00)
GFR calc Af Amer: >60 mL/min (ref >60)
GFR calc non Af Amer: >60 mL/min (ref >60)
Glucose, Bld: 116 mg/dL — ABNORMAL HIGH (ref 65–99)
POTASSIUM: 3.8 mmol/L (ref 3.5–5.1)
Sodium: 135 mmol/L (ref 135–145)

## 2017-06-19 LAB — MAGNESIUM: Magnesium: 1.6 mg/dL — ABNORMAL LOW (ref 1.7–2.4)

## 2017-06-19 LAB — ECHOCARDIOGRAM COMPLETE
HEIGHTINCHES: 62 in
Weight: 1915.36 oz

## 2017-06-19 LAB — PHOSPHORUS: Phosphorus: 4.1 mg/dL (ref 2.5–4.6)

## 2017-06-19 LAB — TSH: TSH: 0.742 u[IU]/mL (ref 0.350–4.500)

## 2017-06-19 MED ORDER — METHYLPREDNISOLONE SODIUM SUCC 40 MG IJ SOLR
30.0000 mg | INTRAMUSCULAR | Status: DC
  Administered 2017-06-19 – 2017-06-20 (×2): 30 mg via INTRAVENOUS
  Filled 2017-06-19 (×2): qty 1

## 2017-06-19 MED ORDER — ENSURE ENLIVE PO LIQD
237.0000 mL | Freq: Three times a day (TID) | ORAL | Status: DC
  Administered 2017-06-19 – 2017-06-21 (×5): 237 mL via ORAL

## 2017-06-19 MED ORDER — MAGNESIUM SULFATE 2 GM/50ML IV SOLN
2.0000 g | Freq: Once | INTRAVENOUS | Status: AC
  Administered 2017-06-19: 2 g via INTRAVENOUS
  Filled 2017-06-19: qty 50

## 2017-06-19 MED ORDER — ORAL CARE MOUTH RINSE
15.0000 mL | Freq: Two times a day (BID) | OROMUCOSAL | Status: DC
  Administered 2017-06-19 – 2017-06-21 (×6): 15 mL via OROMUCOSAL

## 2017-06-19 MED ORDER — ADULT MULTIVITAMIN W/MINERALS CH
1.0000 | ORAL_TABLET | Freq: Every day | ORAL | Status: DC
  Administered 2017-06-20 – 2017-06-21 (×2): 1 via ORAL
  Filled 2017-06-19 (×2): qty 1

## 2017-06-19 NOTE — Progress Notes (Signed)
*  PRELIMINARY RESULTS* Echocardiogram 2D Echocardiogram has been performed.  [name] 06/19/2017, 11:43 AM

## 2017-06-19 NOTE — Progress Notes (Signed)
Pharmacy Antibiotic Note  SHAHIDA SCHNACKENBERG is a 84 y.o. female admitted on 06/18/2017 with suspected community acquired pneumonia.  Pharmacy has been consulted for Zosyn dosing.  Plan: Zosyn 3.375g IV q8h (4 hour infusion).  Height: 5\' 2"  (157.5 cm) Weight: 119 lb 11.4 oz (54.3 kg) IBW/kg (Calculated) : 50.1  Temp (24hrs), Avg:97.8 F (36.6 C), Min:97 F (36.1 C), Max:98.3 F (36.8 C)  Recent Labs  Lab 06/18/17 1106 06/19/17 0448  WBC 13.3* 7.3  CREATININE 0.77 0.68    Estimated Creatinine Clearance: 43.6 mL/min (by C-G formula based on SCr of 0.68 mg/dL).    Allergies  Allergen Reactions  . Morphine And Related Other (See Comments)    halluncinations  . Sulfa Antibiotics Itching    redness  . Sulfonamide Derivatives Itching  . Tetanus Toxoid Itching and Swelling    Antimicrobials this admission: Zosyn 5/4 >>   Dose adjustments this admission:   Microbiology results: MRSA PCR: 5/4 >> NEGATIVE   Thank you for allowing pharmacy to be a part of this patient's care.  Chinita Greenland PharmD Clinical Pharmacist 06/19/2017,

## 2017-06-19 NOTE — Progress Notes (Signed)
Initial Nutrition Assessment  DOCUMENTATION CODES:   Non-severe (moderate) malnutrition in context of chronic illness  INTERVENTION:  Recommend liberalizing diet to regular.  Provide Ensure Enlive po TID, each supplement provides 350 kcal and 20 grams of protein.  Provide daily MVI.  NUTRITION DIAGNOSIS:   Moderate Malnutrition related to chronic illness(COPD, hx lung cancer) as evidenced by moderate fat depletion, mild muscle depletion.  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   -Pending PMT consult.  Met with patient and her daughter at bedside. Patient sleeping at time of assessment and daughter wanted her to be able to continue sleeping, so history provided by daughter. She reports that patient has been on Megace for approximately 3 years now and had been working on eating better to gain weight. For breakfast she typically eats cookies and milk with her medications. Later around lunch time she may have some eggs and toast or a biscuit. In the afternoon she has ice cream or popcorn. For dinner she has soup, sandwich, or occasionally pizza. She had previously been drinking Ensure when she was trying to gain her weight back, but now that her weight is stable she has not been drinking as regularly. Acutely she has had a very poor appetite the past 2 weeks in setting of shortness of breath and has been eating very little during the day.  UBW around 115-120 lbs for most of adult life. Patient lost down to approximately  108-109 lbs at her lowest but has since gained her weight back per daughter. According to weight history in chart she was 106-111 lbs in 2017. She got down to 105.2 lbs on 12/22/2016. RD concerned that current weight is falsely elevated from edema. Daughter confirms that patient has had lower extremity edema for a few weeks now.  Medications reviewed and include: Solu-Medrol 60 mg Q24hrs IV, Remeron 30 mg daily, NS @ 50 mL/hr, azithromycin, famotidine, Zosyn.  Labs reviewed: Chloride 88, CO2 39.  Discussed with RN.  NUTRITION - FOCUSED PHYSICAL EXAM:    Most Recent Value  Orbital Region  Moderate depletion  Upper Arm Region  Moderate depletion  Thoracic and Lumbar Region  Mild depletion  Buccal Region  Moderate depletion  Temple Region  Moderate depletion  Clavicle Bone Region  Mild depletion  Clavicle and Acromion Bone Region  Mild depletion  Scapular Bone Region  Unable to assess  Dorsal Hand  Mild depletion  Patellar Region  Unable to assess  Anterior Thigh Region  Unable to assess  Posterior Calf Region  Unable to assess  Edema (RD Assessment)  Moderate [bilateral lower extremities]  Hair  Reviewed  Eyes  Unable to assess  Mouth  Unable to assess  Skin  Reviewed [ecchymosis]  Nails  Reviewed     Diet Order:   Diet Order           Diet regular Room service appropriate? Yes; Fluid consistency: Thin  Diet effective now          EDUCATION NEEDS:  No education needs have been identified at this time  Skin:  Skin Assessment: Reviewed RN Assessment(scattered ecchymosis)  Last BM:  06/18/2017 - small type 5  Height:   Ht Readings from Last 1 Encounters:  06/18/17 '5\' 2"'  (1.575 m)    Weight:   Wt Readings from Last 1 Encounters:  06/18/17 119 lb 11.4 oz (54.3 kg)    Ideal Body Weight:  50 kg  BMI:  Body mass index is 21.9 kg/m.  Estimated Nutritional Needs:   Kcal:  1360-1630 (25-30 kcal/kg)  Protein:  70-80 grams (1.3-1.5 grams/kg)  Fluid:  1.4-1.6 L/day  (25-30 mL/kg)  Willey Blade, MS, RD, LDN Office: [phone] Pager: [phone] After Hours/Weekend Pager: [phone]

## 2017-06-19 NOTE — Progress Notes (Addendum)
Name: [name] MRN: [id number] DOB: Mar 03, 1933     CONSULTATION DATE: 06/18/2017 Subjective & Objective: She remains on BiPAP, afebrile and no major issues last night  PAST MEDICAL HISTORY :   has a past medical history of Anxiety, Cancer ([place]), COPD (chronic obstructive pulmonary disease) ([place]), Coronary artery disease, GERD (gastroesophageal reflux disease), Hyperlipidemia, Lower extremity edema, Lung disease, Myocardial infarction [facility]), Ovarian cyst (07/30/2014), Past heart attack, Personal history of chemotherapy, and Shortness of breath.  has a past surgical history that includes Cholecystectomy (2014); Lung surgery (2007); Tonsillectomy; and Breast biopsy (Left, 11-2012). Prior to Admission medications   Medication Sig Start Date End Date Taking? Authorizing Provider  ADVAIR DISKUS 250-50 MCG/DOSE AEPB INHALE 1 PUFF TWICE DAILY.  RINSE MOUTH AFTER EACH USE Patient taking differently: Inhale 1 puff by mouth twice daily - rinse mouth after use 03/24/15  Yes Herring, Orville Govern, NP  albuterol (PROVENTIL HFA;VENTOLIN HFA) 108 (90 Base) MCG/ACT inhaler Inhale 2 puffs into the lungs every 6 (six) hours as needed for wheezing or shortness of breath.   Yes [provider]  atorvastatin (LIPITOR) 20 MG tablet Take 20 mg by mouth daily with supper.  04/22/15  Yes [provider]  calcitonin, salmon, (MIACALCIN/FORTICAL) 200 UNIT/ACT nasal spray Place 1 spray into alternate nostrils daily. 06/15/17  Yes [provider]  Calcium Carb-Cholecalciferol (CALCIUM 600 + D PO) Take 1 tablet by mouth daily.   Yes [provider]  clonazePAM (KLONOPIN) 1 MG tablet Take 1 tablet by mouth 3 (three) times daily. 06/16/17  Yes [provider]  escitalopram (LEXAPRO) 10 MG tablet Take 10 mg by mouth 2 (two) times daily.    Yes [provider]  furosemide (LASIX) 40 MG tablet Take 40 mg by mouth daily.    Yes [provider]  ibuprofen (ADVIL,MOTRIN) 200  MG tablet Take 200-400 mg by mouth every 4 (four) hours as needed for mild pain or moderate pain.   Yes [provider]  megestrol (MEGACE) 400 MG/10ML suspension Take 400 mg daily by mouth.   Yes [provider]  mirtazapine (REMERON) 30 MG tablet Take 30 mg by mouth daily with supper.  05/14/13  Yes [provider]  Multiple Vitamin (MULTIVITAMIN WITH MINERALS) TABS tablet Take 1 tablet by mouth daily with supper. CENTRUM SILVER 50+   Yes [provider]  Naphazoline HCl (CLEAR EYES OP) Place 1-2 drops into both eyes 3 (three) times daily as needed (FOR DRY EYES).   Yes [provider]  omeprazole (PRILOSEC) 40 MG capsule Take 40 mg by mouth daily before breakfast.  04/30/13  Yes [provider]  senna-docusate (SENOKOT-S) 8.6-50 MG tablet Take 1 tablet by mouth daily with supper.    Yes [provider]  SPIRIVA HANDIHALER 18 MCG inhalation capsule Place 18 mcg into inhaler and inhale daily.  11/01/12  Yes [provider]  sodium chloride (OCEAN) 0.65 % SOLN nasal spray Place 1 spray into both nostrils 4 (four) times daily as needed for congestion.    [provider]   Allergies  Allergen Reactions  . Morphine And Related Other (See Comments)    halluncinations  . Sulfa Antibiotics Itching    redness  . Sulfonamide Derivatives Itching  . Tetanus Toxoid Itching and Swelling    FAMILY HISTORY:  family history includes Alcohol abuse in her brother; Aneurysm in her mother; Breast cancer (age of onset: 5) in her daughter; COPD in her sister; Cancer in her  daughter; Healthy in her sister; Heart attack in her brother; Heart failure in her father; Hyperlipidemia in her brother, father, mother, and sister; Scleroderma in her sister; Thyroid disease in her mother and sister. SOCIAL HISTORY:  reports that she quit smoking about 15 years ago. Her smoking use included cigarettes. She has a 60.00 pack-year smoking history. She  uses smokeless tobacco. She reports that she drinks about 4.2 oz of alcohol per week. She reports that she does not use drugs.  REVIEW OF SYSTEMS:   Unable to obtain due to critical illness   VITAL SIGNS: Temp:  [97 F (36.1 C)-98.3 F (36.8 C)] 97.8 F (36.6 C) (05/05 0800) Pulse Rate:  [74-91] 76 (05/05 1000) Resp:  [14-33] 14 (05/05 1000) BP: (88-132)/(48-98) 111/59 (05/05 1000) SpO2:  [90 %-100 %] 97 % (05/05 1000) FiO2 (%):  [30 %] 30 % (05/05 0800) Weight:  [54.3 kg (119 lb 11.4 oz)] 54.3 kg (119 lb 11.4 oz) (05/04 1503)  Physical Examination:  Awake, confused, following commands, no focal motor deficits. On BiPAP, no distress, able to talk, BEAE and no rales S1 & S2 audible and no murmur Benign abdomen with normal peristalses Wasted extremities, bil. Leg edema, large Rt. prepatellar hematoma, dorsal aspect of Lt. Foot in addition to multiple bruises both LE as per family due to frequent falls recently  ASSESSMENT / PLAN:  Acute on chronic respiratory failure with baseline home O2 2 L/min nasal cannula.  Tolerating BiPAP -Monitor ABG and optimize BiPAP settings -As discussed with the family at the bedside the patient wishes not to be intubated.  Altered mental status with possible CO2 narcosis.  -CT head to r/o CVA / intracranial abnormality  Acute exacerbation of COPD -Bronchodilators, ABX and consider steroids if continues to have active airway disease  Atelectasis and possible pneumonia was infective etiology cannot be ruled out.  Bibasilar airspace disease -Zosyn + Zithro. Monitor CXR + CBC + FIO2.  Hyponatremia, hypochloremia, contraction alkalosis was intravascular volume depletion and steroids -Optimize hydration, taper off steroid as tolerated, monitor renal panel and urine output  Spinal compression fractures following up with the spinal surgery at [facility] for possible kyphoplasty. -Optimize analgesia, avoid using narcotics.  CAD.  No chest pain, mild  elevation cardiac enzymes and no acute ST changes on EKG, possibly due to demand versus supply mismatch -Monitor cardiac enzymes -Echo and consider cardiology consult.  Multiple bruises on lower extremities due to frequent falls. -Fall precautions. -No acute fracture on pelvic and Rt. Knee x-ray   Bil. Pedal edema (improved) -No DVT on venous Doppler  Anemia -Keep Hb more than 7 g/dL  Hypomagnesemia -Replete and monitor electrolytes.  CODE STATUS was addressed with the family at the bedside the patient does not like to be intubated however he remains a full code, okay with CPR, okay with defibrillation and chemical code.  DVT & GI prophylaxis.  Continue with supportive care  Plan of care was discussed with the family at the bedside Critical care time 40 minutes

## 2017-06-19 NOTE — Progress Notes (Signed)
[place] at [place] NAME: [name]    MR#:  [id number]  DATE OF BIRTH:  31-Jan-1934  SUBJECTIVE:  CHIEF COMPLAINT:  Pts sob is better, still some confusion, on bipap  REVIEW OF SYSTEMS:  Review of system unobtainable as the patient is still on BiPAP  DRUG ALLERGIES:   Allergies  Allergen Reactions  . Morphine And Related Other (See Comments)    halluncinations  . Sulfa Antibiotics Itching    redness  . Sulfonamide Derivatives Itching  . Tetanus Toxoid Itching and Swelling    VITALS:  Blood pressure (!) 122/48, pulse 90, temperature (!) 97.5 F (36.4 C), temperature source Axillary, resp. rate (!) 23, height 5\' 2"  (1.575 m), weight 54.3 kg (119 lb 11.4 oz), SpO2 100 %.  PHYSICAL EXAMINATION:  GENERAL:  84 y.o.-year-old patient lying in the bed with no acute distress.  EYES: Pupils equal, round, reactive to light and accommodation. No scleral icterus. Extraocular muscles intact.  HEENT: Head atraumatic, normocephalic. Oropharynx and nasopharynx clear.  NECK:  Supple, no jugular venous distention. No thyroid enlargement, no tenderness.  LUNGS: mod breath sounds bilaterally, no wheezing, rales,rhonchi or crepitation. No use of accessory muscles of respiration.  On BiPAP CARDIOVASCULAR: S1, S2 normal. No murmurs, rubs, or gallops.  ABDOMEN: Soft, nontender, nondistended. Bowel sounds present. No organomegaly or mass.  EXTREMITIES: No pedal edema, cyanosis, or clubbing.  NEUROLOGIC: Arousable but delirious PSYCHIATRIC: The patient is arousable but delirious SKIN: No obvious rash, lesion, or ulcer.    LABORATORY PANEL:   CBC Recent Labs  Lab 06/19/17 0448  WBC 7.3  HGB 10.8*  HCT 32.4*  PLT 474*   ------------------------------------------------------------------------------------------------------------------  Chemistries  Recent Labs  Lab 06/18/17 1106 06/19/17 0448  NA 131* 135  K 3.5 3.8  CL 84* 88*   CO2 39* 39*  GLUCOSE 144* 116*  BUN 15 11  CREATININE 0.77 0.68  CALCIUM 8.5* 8.2*  MG  --  1.6*  AST 35  --   ALT 30  --   ALKPHOS 84  --   BILITOT 0.4  --    ------------------------------------------------------------------------------------------------------------------  Cardiac Enzymes Recent Labs  Lab 06/18/17 1106  TROPONINI 0.05*   ------------------------------------------------------------------------------------------------------------------  RADIOLOGY:  Dg Pelvis 1-2 Views  Result Date: 06/18/2017 CLINICAL DATA:  84 year old female with a history of fall EXAM: PELVIS - 1-2 VIEW COMPARISON:  None. FINDINGS: Bony pelvic ring is intact with no evidence of acute displaced fracture. Degenerative changes of the bilateral hips. Proximal femurs unremarkable. Hips projects normally over the acetabula. Vascular calcifications. IMPRESSION: Negative for acute bony abnormality. Electronically Signed   By: Corrie Mckusick D.O.   On: 06/18/2017 11:46   Ct Head Wo Contrast  Result Date: 06/18/2017 CLINICAL DATA:  Recent fall, altered mental status EXAM: CT HEAD WITHOUT CONTRAST TECHNIQUE: Contiguous axial images were obtained from the base of the skull through the vertex without intravenous contrast. COMPARISON:  None. FINDINGS: Brain: No acute intracranial hemorrhage. No focal mass lesion. No CT evidence of acute infarction. No midline shift or mass effect. No hydrocephalus. Basilar cisterns are patent. Remote deep white matter infarction in the LEFT centrum semiovale. Mild atrophy. Vascular: No hyperdense vessel or unexpected calcification. Skull: Normal. Negative for fracture or focal lesion. Sinuses/Orbits: Paranasal sinuses and mastoid air cells are clear. Orbits are clear. Other: None. IMPRESSION: 1. No acute intracranial findings. 2. No intracranial trauma. 3. Deep white matter infarction on the LEFT. Electronically Signed   By: Nicole Kindred  Leonia Reeves M.D.   On: 06/18/2017 17:37   US Venous  Img Lower Bilateral  Result Date: 06/18/2017 CLINICAL DATA:  Lower extremity edema for 4 weeks EXAM: BILATERAL LOWER EXTREMITY VENOUS DOPPLER ULTRASOUND TECHNIQUE: Gray-scale sonography with graded compression, as well as color Doppler and duplex ultrasound were performed to evaluate the lower extremity deep venous systems from the level of the common femoral vein and including the common femoral, femoral, profunda femoral, popliteal and calf veins including the posterior tibial, peroneal and gastrocnemius veins when visible. The superficial great saphenous vein was also interrogated. Spectral Doppler was utilized to evaluate flow at rest and with distal augmentation maneuvers in the common femoral, femoral and popliteal veins. COMPARISON:  None. FINDINGS: RIGHT LOWER EXTREMITY Common Femoral Vein: No evidence of thrombus. Normal compressibility, respiratory phasicity and response to augmentation. Saphenofemoral Junction: No evidence of thrombus. Normal compressibility and flow on color Doppler imaging. Profunda Femoral Vein: No evidence of thrombus. Normal compressibility and flow on color Doppler imaging. Femoral Vein: No evidence of thrombus. Normal compressibility, respiratory phasicity and response to augmentation. Popliteal Vein: No evidence of thrombus. Normal compressibility, respiratory phasicity and response to augmentation. Calf Veins: No evidence of thrombus. Normal compressibility and flow on color Doppler imaging. Superficial Great Saphenous Vein: No evidence of thrombus. Normal compressibility. Venous Reflux:  None. Other Findings:  None. LEFT LOWER EXTREMITY Common Femoral Vein: No evidence of thrombus. Normal compressibility, respiratory phasicity and response to augmentation. Saphenofemoral Junction: No evidence of thrombus. Normal compressibility and flow on color Doppler imaging. Profunda Femoral Vein: No evidence of thrombus. Normal compressibility and flow on color Doppler imaging. Femoral  Vein: No evidence of thrombus. Normal compressibility, respiratory phasicity and response to augmentation. Popliteal Vein: No evidence of thrombus. Normal compressibility, respiratory phasicity and response to augmentation. Calf Veins: No evidence of thrombus. Normal compressibility and flow on color Doppler imaging. Superficial Great Saphenous Vein: No evidence of thrombus. Normal compressibility. Venous Reflux:  None. Other Findings: Subcutaneous edema is seen in left greater than right lower extremities. IMPRESSION: No evidence of deep venous thrombosis in either lower extremity. Electronically Signed   By: Ilona Sorrel M.D.   On: 06/18/2017 17:11   Dg Chest Port 1 View  Result Date: 06/19/2017 CLINICAL DATA:  Atelectasis EXAM: PORTABLE CHEST 1 VIEW COMPARISON:  Chest radiograph from one day prior. FINDINGS: Surgical suture line overlies the left lung apex. Stable cardiomediastinal silhouette with normal heart size. No pneumothorax. No pleural effusion. No pulmonary edema. Stable mild scarring at the left lung apex and at the lung bases. No acute consolidative airspace disease. IMPRESSION: Stable chest radiograph with scattered mild scarring and postsurgical changes at the left lung apex. Electronically Signed   By: Ilona Sorrel M.D.   On: 06/19/2017 07:07   Dg Chest Portable 1 View  Result Date: 06/18/2017 CLINICAL DATA:  Dyspnea EXAM: PORTABLE CHEST 1 VIEW COMPARISON:  05/18/2017 chest radiograph. FINDINGS: Surgical suture line overlies the left lung apex. Stable cardiomediastinal silhouette with normal heart size. No pneumothorax. No pleural effusion. No pulmonary edema. Hyperinflated lungs with emphysema. Scattered mild scarring throughout both lungs, unchanged. No acute consolidative airspace disease. IMPRESSION: 1. No acute cardiopulmonary disease. Stable scattered scarring in both lungs with postsurgical changes at the left lung apex. 2. Hyperinflated lungs with emphysema, suggesting COPD.  Electronically Signed   By: Ilona Sorrel M.D.   On: 06/18/2017 11:46   Dg Knee Complete 4 Views Right  Result Date: 06/18/2017 CLINICAL DATA:  84 year old female with a history of  fall EXAM: RIGHT KNEE - COMPLETE 4+ VIEW COMPARISON:  None. FINDINGS: Osteopenia. No acute displaced fracture identified. Mild degenerative changes of the knee. Soft tissue swelling anterior to the patella. IMPRESSION: No acute bony abnormality. Significant prepatellar soft tissue swelling. Electronically Signed   By: Corrie Mckusick D.O.   On: 06/18/2017 11:45    EKG:   Orders placed or performed during the hospital encounter of 06/18/17  . ED EKG  . ED EKG  . EKG 12-Lead  . EKG 12-Lead    ASSESSMENT AND PLAN:   *Delirium from CO2 narcosis CT head with no acute findings   *Acute on chronic hypercapnic respiratory failure due to COPD exacerbation possible pneumonia .  continue IV steroids, nebulizers.   BiPAP support, wean off as tolerated  Discussed with ICU attending.  Antibiotics Zosyn And azithromycin   *Hypomagnesemia replete and recheck in a.m.  *Recent compression fracture.  Pain is much improved.  She is following up with the spine surgeon at [facility] for possible kyphoplasty if pain does not improve. High risk for surgery.  *CAD.  Stable. Mildly elevated Troponin 0 0.05 secondary to demand ischemia.  Echocardiogram   DVT prophylaxis with Lovenox       All the records are reviewed and case discussed with Care Management/Social Workerr. Management plans discussed with the patient, family and they are in agreement.  CODE STATUS: PARTIAL CODE , okay with CPR, agreeable with defibrillation and chemical code, refusing intubation  TOTAL TIME TAKING CARE OF THIS PATIENT: 35 minutes.   POSSIBLE D/C IN 3 DAYS, DEPENDING ON CLINICAL CONDITION.  Note: This dictation was prepared with Dragon dictation along with smaller phrase technology. Any transcriptional errors that result from this process  are unintentional.   Nicholes Mango M.D on 06/19/2017 at 1:42 PM  Between 7am to 6pm - Pager - [phone] After 6pm go to [email or web address] - password [email or web address] [facility]  Office  [phone]  CC: Primary care physician; Idelle Crouch, MD

## 2017-06-19 NOTE — Plan of Care (Signed)
Patient rested most of shift. Alert and cooperative. Nasal cannula at 2L this shift.  Tolerated well.  No acute concerns at this time.  All meds taken whole and swallowed without difficulty.  Will continue to monitor.

## 2017-06-20 DIAGNOSIS — J9602 Acute respiratory failure with hypercapnia: Secondary | ICD-10-CM

## 2017-06-20 DIAGNOSIS — Z7189 Other specified counseling: Secondary | ICD-10-CM

## 2017-06-20 DIAGNOSIS — Z66 Do not resuscitate: Secondary | ICD-10-CM

## 2017-06-20 DIAGNOSIS — E440 Moderate protein-calorie malnutrition: Secondary | ICD-10-CM

## 2017-06-20 DIAGNOSIS — Z515 Encounter for palliative care: Secondary | ICD-10-CM

## 2017-06-20 DIAGNOSIS — J9601 Acute respiratory failure with hypoxia: Secondary | ICD-10-CM

## 2017-06-20 LAB — BASIC METABOLIC PANEL
Anion gap: 5 (ref 5–15)
BUN: 13 mg/dL (ref 6–20)
CALCIUM: 7.9 mg/dL — AB (ref 8.9–10.3)
CO2: 40 mmol/L — ABNORMAL HIGH (ref 22–32)
CREATININE: 0.72 mg/dL (ref 0.44–1.00)
Chloride: 94 mmol/L — ABNORMAL LOW (ref 101–111)
GFR calc non Af Amer: >60 mL/min (ref >60)
Glucose, Bld: 87 mg/dL (ref 65–99)
Potassium: 3.5 mmol/L (ref 3.5–5.1)
Sodium: 139 mmol/L (ref 135–145)

## 2017-06-20 LAB — CBC WITH DIFFERENTIAL/PLATELET
BASOS PCT: 0 %
Basophils Absolute: 0 10*3/uL (ref 0–0.1)
EOS ABS: 0.1 10*3/uL (ref 0–0.7)
Eosinophils Relative: 1 %
HEMATOCRIT: 27.2 % — AB (ref 35.0–47.0)
Hemoglobin: 9.2 g/dL — ABNORMAL LOW (ref 12.0–16.0)
LYMPHS ABS: 2.1 10*3/uL (ref 1.0–3.6)
Lymphocytes Relative: 19 %
MCH: 31.3 pg (ref 26.0–34.0)
MCHC: 33.9 g/dL (ref 32.0–36.0)
MCV: 92.3 fL (ref 80.0–100.0)
MONO ABS: 0.7 10*3/uL (ref 0.2–0.9)
MONOS PCT: 6 %
NEUTROS ABS: 8.1 10*3/uL — AB (ref 1.4–6.5)
Neutrophils Relative %: 74 %
Platelets: 481 10*3/uL — ABNORMAL HIGH (ref 150–440)
RBC: 2.95 MIL/uL — ABNORMAL LOW (ref 3.80–5.20)
RDW: 13.5 % (ref 11.5–14.5)
WBC: 11 10*3/uL (ref 3.6–11.0)

## 2017-06-20 LAB — MAGNESIUM: Magnesium: 2.2 mg/dL (ref 1.7–2.4)

## 2017-06-20 LAB — PHOSPHORUS: PHOSPHORUS: 2.6 mg/dL (ref 2.5–4.6)

## 2017-06-20 MED ORDER — ESCITALOPRAM OXALATE 10 MG PO TABS
10.0000 mg | ORAL_TABLET | Freq: Two times a day (BID) | ORAL | Status: DC
  Administered 2017-06-20 – 2017-06-21 (×4): 10 mg via ORAL
  Filled 2017-06-20 (×6): qty 1

## 2017-06-20 NOTE — Care Management (Addendum)
ICU RN consulted this RNCM for rehab at [facility] per daughter request due to frequent falls at home.  PT evaluation requested. Patient from home alone; O2 through [place]. [facility]; case closed April 27, 2017.

## 2017-06-20 NOTE — Progress Notes (Signed)
Pt has talked about dying all day. VSS with exception of some tachypnea and labored breathing that increases.during periods of anxiety. Xanax has been given x2. She has refused bipap for labored breathing. Palliative care spoke with pt and her daughter today. Daughter is HCPOA. Pt has decided to be a DNR.

## 2017-06-20 NOTE — Clinical Social Work Note (Signed)
CSW attempted to see patient but both Palliative Care doctor and Attending Physician were meeting with patient. Will attempt at a later time.

## 2017-06-20 NOTE — Progress Notes (Signed)
[place] at [place] NAME: [name]    MR#:  [id number]  DATE OF BIRTH:  November 17, 1933  SUBJECTIVE:  CHIEF COMPLAINT:  Pts sob is better, according to daughter patient has chronic baseline shortness of breath, off bipap, seen by palliative care today  REVIEW OF SYSTEMS:  Review of system unobtainable , patient is lethargic but arousable  DRUG ALLERGIES:   Allergies  Allergen Reactions  . Morphine And Related Other (See Comments)    halluncinations  . Sulfa Antibiotics Itching    redness  . Sulfonamide Derivatives Itching  . Tetanus Toxoid Itching and Swelling    VITALS:  Blood pressure (!) 110/55, pulse 86, temperature 97.9 F (36.6 C), temperature source Oral, resp. rate (!) 27, height 5\' 2"  (1.575 m), weight 54.3 kg (119 lb 11.4 oz), SpO2 98 %.  PHYSICAL EXAMINATION:  GENERAL:  84 y.o.-year-old patient lying in the bed with no acute distress.  EYES: Pupils equal, round, reactive to light and accommodation. No scleral icterus. Extraocular muscles intact.  HEENT: Head atraumatic, normocephalic. Oropharynx and nasopharynx clear.  NECK:  Supple, no jugular venous distention. No thyroid enlargement, no tenderness.  LUNGS: mod breath sounds bilaterally, no wheezing, rales,rhonchi or crepitation. No use of accessory muscles of respiration.  Off BiPAP CARDIOVASCULAR: S1, S2 normal. No murmurs, rubs, or gallops.  ABDOMEN: Soft, nontender, nondistended. Bowel sounds present. No organomegaly or mass.  EXTREMITIES: No pedal edema, cyanosis, or clubbing.  NEUROLOGIC: Arousable but delirious PSYCHIATRIC: The patient is arousable but delirious SKIN: No obvious rash, lesion, or ulcer.    LABORATORY PANEL:   CBC Recent Labs  Lab 06/20/17 0455  WBC 11.0  HGB 9.2*  HCT 27.2*  PLT 481*   ------------------------------------------------------------------------------------------------------------------  Chemistries  Recent Labs   Lab 06/18/17 1106  06/20/17 0455  NA 131*   < > 139  K 3.5   < > 3.5  CL 84*   < > 94*  CO2 39*   < > 40*  GLUCOSE 144*   < > 87  BUN 15   < > 13  CREATININE 0.77   < > 0.72  CALCIUM 8.5*   < > 7.9*  MG  --    < > 2.2  AST 35  --   --   ALT 30  --   --   ALKPHOS 84  --   --   BILITOT 0.4  --   --    < > = values in this interval not displayed.   ------------------------------------------------------------------------------------------------------------------  Cardiac Enzymes Recent Labs  Lab 06/18/17 1106  TROPONINI 0.05*   ------------------------------------------------------------------------------------------------------------------  RADIOLOGY:  Ct Head Wo Contrast  Result Date: 06/18/2017 CLINICAL DATA:  Recent fall, altered mental status EXAM: CT HEAD WITHOUT CONTRAST TECHNIQUE: Contiguous axial images were obtained from the base of the skull through the vertex without intravenous contrast. COMPARISON:  None. FINDINGS: Brain: No acute intracranial hemorrhage. No focal mass lesion. No CT evidence of acute infarction. No midline shift or mass effect. No hydrocephalus. Basilar cisterns are patent. Remote deep white matter infarction in the LEFT centrum semiovale. Mild atrophy. Vascular: No hyperdense vessel or unexpected calcification. Skull: Normal. Negative for fracture or focal lesion. Sinuses/Orbits: Paranasal sinuses and mastoid air cells are clear. Orbits are clear. Other: None. IMPRESSION: 1. No acute intracranial findings. 2. No intracranial trauma. 3. Deep white matter infarction on the LEFT. Electronically Signed   By: Suzy Bouchard M.D.   On: 06/18/2017 17:37  US Venous Img Lower Bilateral  Result Date: 06/18/2017 CLINICAL DATA:  Lower extremity edema for 4 weeks EXAM: BILATERAL LOWER EXTREMITY VENOUS DOPPLER ULTRASOUND TECHNIQUE: Gray-scale sonography with graded compression, as well as color Doppler and duplex ultrasound were performed to evaluate the lower  extremity deep venous systems from the level of the common femoral vein and including the common femoral, femoral, profunda femoral, popliteal and calf veins including the posterior tibial, peroneal and gastrocnemius veins when visible. The superficial great saphenous vein was also interrogated. Spectral Doppler was utilized to evaluate flow at rest and with distal augmentation maneuvers in the common femoral, femoral and popliteal veins. COMPARISON:  None. FINDINGS: RIGHT LOWER EXTREMITY Common Femoral Vein: No evidence of thrombus. Normal compressibility, respiratory phasicity and response to augmentation. Saphenofemoral Junction: No evidence of thrombus. Normal compressibility and flow on color Doppler imaging. Profunda Femoral Vein: No evidence of thrombus. Normal compressibility and flow on color Doppler imaging. Femoral Vein: No evidence of thrombus. Normal compressibility, respiratory phasicity and response to augmentation. Popliteal Vein: No evidence of thrombus. Normal compressibility, respiratory phasicity and response to augmentation. Calf Veins: No evidence of thrombus. Normal compressibility and flow on color Doppler imaging. Superficial Great Saphenous Vein: No evidence of thrombus. Normal compressibility. Venous Reflux:  None. Other Findings:  None. LEFT LOWER EXTREMITY Common Femoral Vein: No evidence of thrombus. Normal compressibility, respiratory phasicity and response to augmentation. Saphenofemoral Junction: No evidence of thrombus. Normal compressibility and flow on color Doppler imaging. Profunda Femoral Vein: No evidence of thrombus. Normal compressibility and flow on color Doppler imaging. Femoral Vein: No evidence of thrombus. Normal compressibility, respiratory phasicity and response to augmentation. Popliteal Vein: No evidence of thrombus. Normal compressibility, respiratory phasicity and response to augmentation. Calf Veins: No evidence of thrombus. Normal compressibility and flow on  color Doppler imaging. Superficial Great Saphenous Vein: No evidence of thrombus. Normal compressibility. Venous Reflux:  None. Other Findings: Subcutaneous edema is seen in left greater than right lower extremities. IMPRESSION: No evidence of deep venous thrombosis in either lower extremity. Electronically Signed   By: Ilona Sorrel M.D.   On: 06/18/2017 17:11   Dg Chest Port 1 View  Result Date: 06/19/2017 CLINICAL DATA:  Atelectasis EXAM: PORTABLE CHEST 1 VIEW COMPARISON:  Chest radiograph from one day prior. FINDINGS: Surgical suture line overlies the left lung apex. Stable cardiomediastinal silhouette with normal heart size. No pneumothorax. No pleural effusion. No pulmonary edema. Stable mild scarring at the left lung apex and at the lung bases. No acute consolidative airspace disease. IMPRESSION: Stable chest radiograph with scattered mild scarring and postsurgical changes at the left lung apex. Electronically Signed   By: Ilona Sorrel M.D.   On: 06/19/2017 07:07    EKG:   Orders placed or performed during the hospital encounter of 06/18/17  . ED EKG  . ED EKG  . EKG 12-Lead  . EKG 12-Lead    ASSESSMENT AND PLAN:   *Delirium from CO2 narcosis CT head with no acute findings   *Acute on chronic hypercapnic respiratory failure due to COPD exacerbation possible pneumonia .taper IV steroids, nebulizers.   off BiPAP   Discussed with ICU attending.  Antibiotics Zosyn and azithromycin   *Hypomagnesemia replete and recheck in a.m.  *Recent compression fracture.  Pain is much improved.  She is following up with the spine surgeon at [facility] for possible kyphoplasty if pain does not improve. High risk for surgery.  *CAD.  Stable. Mildly elevated Troponin 0 0.05 secondary to demand ischemia.  Echocardiogram   DVT prophylaxis with Lovenox  * FTT -seen by palliative care, disposition to be determined: Includes skilled nursing facility versus home with hospice care     All the  records are reviewed and case discussed with Care Management/Social Workerr. Management plans discussed with the patient, family and they are in agreement.  CODE STATUS: PARTIAL CODE , okay with CPR, agreeable with defibrillation and chemical code, refusing intubation  TOTAL TIME TAKING CARE OF THIS PATIENT: 35 minutes.   POSSIBLE D/C IN 3 DAYS, DEPENDING ON CLINICAL CONDITION.  Note: This dictation was prepared with Dragon dictation along with smaller phrase technology. Any transcriptional errors that result from this process are unintentional.   Nicholes Mango M.D on 06/20/2017 at 4:07 PM  Between 7am to 6pm - Pager - [phone] After 6pm go to [email or web address] - password [email or web address] [email or web address]  Office  [phone]  CC: Primary care physician; Idelle Crouch, MD

## 2017-06-20 NOTE — Progress Notes (Signed)
Follow up - Critical Care Medicine Note  Patient Details:    [name] is an 84 y.o. female. 84 years old lady with a history of lung CA status post lobectomy, remote tobacco abuse, COPD, CAD, dyslipidemia, anxiety, and recent history of falls spinal compression was vertebral fractures admitted for acute exacerbation of COPD.   Lines, Airways, Drains: Urethral Catheter Gennie Alma, RN Double-lumen 14 Fr. (Active)  Indication for Insertion or Continuance of Catheter Acute urinary retention 06/20/2017  8:00 AM  Site Assessment Clean;Intact 06/20/2017  8:00 AM  Catheter Maintenance Bag below level of bladder;Catheter secured;Drainage bag/tubing not touching floor;Insertion date on drainage bag;No dependent loops;Seal intact;Bag emptied prior to transport 06/20/2017  8:00 AM  Collection Container Standard drainage bag 06/20/2017  2:00 AM  Securement Method Securing device (Describe) 06/20/2017  2:00 AM  Urinary Catheter Interventions Unclamped 06/20/2017  2:00 AM  Output (mL) 45 mL 06/20/2017  8:00 AM    Anti-infectives:  Anti-infectives (From admission, onward)   Start     Dose/Rate Route Frequency Ordered Stop   06/18/17 1700  azithromycin (ZITHROMAX) 500 mg in sodium chloride 0.9 % 250 mL IVPB     500 mg 250 mL/hr over 60 Minutes Intravenous Every 24 hours 06/18/17 1645 06/23/17 1644   06/18/17 1700  piperacillin-tazobactam (ZOSYN) IVPB 3.375 g     3.375 g 12.5 mL/hr over 240 Minutes Intravenous Every 8 hours 06/18/17 1649        Microbiology: Results for orders placed or performed during the hospital encounter of 06/18/17  MRSA PCR Screening     Status: None   Collection Time: 06/18/17  3:05 PM  Result Value Ref Range Status   MRSA by PCR NEGATIVE NEGATIVE Final    Comment:        The GeneXpert MRSA Assay (FDA approved for NASAL specimens only), is one component of a comprehensive MRSA colonization surveillance program. It is not intended to diagnose MRSA infection nor to guide  or monitor treatment for MRSA infections. Performed at [facility], [address] [place], [place] [address]     Studies: Dg Pelvis 1-2 Views  Result Date: 06/18/2017 CLINICAL DATA:  84 year old female with a history of fall EXAM: PELVIS - 1-2 VIEW COMPARISON:  None. FINDINGS: Bony pelvic ring is intact with no evidence of acute displaced fracture. Degenerative changes of the bilateral hips. Proximal femurs unremarkable. Hips projects normally over the acetabula. Vascular calcifications. IMPRESSION: Negative for acute bony abnormality. Electronically Signed   By: Corrie Mckusick D.O.   On: 06/18/2017 11:46   Ct Head Wo Contrast  Result Date: 06/18/2017 CLINICAL DATA:  Recent fall, altered mental status EXAM: CT HEAD WITHOUT CONTRAST TECHNIQUE: Contiguous axial images were obtained from the base of the skull through the vertex without intravenous contrast. COMPARISON:  None. FINDINGS: Brain: No acute intracranial hemorrhage. No focal mass lesion. No CT evidence of acute infarction. No midline shift or mass effect. No hydrocephalus. Basilar cisterns are patent. Remote deep white matter infarction in the LEFT centrum semiovale. Mild atrophy. Vascular: No hyperdense vessel or unexpected calcification. Skull: Normal. Negative for fracture or focal lesion. Sinuses/Orbits: Paranasal sinuses and mastoid air cells are clear. Orbits are clear. Other: None. IMPRESSION: 1. No acute intracranial findings. 2. No intracranial trauma. 3. Deep white matter infarction on the LEFT. Electronically Signed   By: Suzy Bouchard M.D.   On: 06/18/2017 17:37   Ct Angio Chest Pe W Or Wo Contrast  Result Date: 06/15/2017 CLINICAL DATA:  84 year old female with a history shortness of breath EXAM: CT ANGIOGRAPHY CHEST WITH CONTRAST TECHNIQUE: Multidetector CT imaging of the chest was performed using the standard protocol during bolus administration of intravenous contrast. Multiplanar CT image reconstructions and MIPs  were obtained to evaluate the vascular anatomy. CONTRAST:  54mL ISOVUE-370 IOPAMIDOL (ISOVUE-370) INJECTION 76% COMPARISON:  CT chest 12/03/2014, 12/04/2013 FINDINGS: Cardiovascular: Heart: No cardiomegaly. No pericardial fluid/thickening. Calcifications of the left anterior descending coronary artery and circumflex coronary artery. Aorta: Atherosclerotic changes of the aortic arch and the branch vessels. Calcifications of the descending thoracic aorta. No dissection flap. No periaortic fluid. Branch vessels are patent. No aneurysm. Pulmonary arteries: No central, lobar, segmental, or proximal subsegmental filling defects. Mediastinum/Nodes: No mediastinal adenopathy. Unremarkable appearance of the thoracic esophagus. Unremarkable appearance of the thoracic inlet and thyroid. Lungs/Pleura: Spiculated nodule at the right lung apex measures 7 mm. This is in the region of prior scarring, now more confluent. Redemonstration of architectural distortion with potentially postoperative changes at the left lung apex. Centrilobular nodularity at the posterior right upper lobe. No confluent airspace disease. Chronic right middle lobe collapse. No pleural effusion or pneumothorax. No confluent airspace disease. Persistent scarring in the lingula. Advanced paraseptal and centrilobular emphysema. Upper Abdomen: Herniation of fat at the posterior right diaphragm, unchanged from prior. Musculoskeletal: Redemonstration of T7 compression fracture. Compared to the MRI performed 05/12/2017, there is additional compression deformity of the T6 level. No retropulsion of fracture fragment or fracture line identified. No acute displaced rib fracture. Review of the MIP images confirms the above findings. IMPRESSION: Study is negative for pulmonary embolism. Advanced emphysema with regions of architectural distortion, persisting left upper lobe scarring/treatment changes, and persistent right middle lobe collapse. Emphysema (ICD10-J43.9).  Spiculated nodule at the right lung apex measures 7 mm. This has developed in a region of scarring present on the CT 12/03/2014. While this may represent progressive scarring, a new primary lung carcinoma not excluded, and referral for oncologic evaluation and further management strategy recommended, as this is borderline size for PET CT imaging. Redemonstration of T7 compression fracture. Compared to the MRI of 05/12/2017, there is increasing deformity at T6 vertebral body, potentially acute or subacute compression fracture. Aortic Atherosclerosis (ICD10-I70.0). Electronically Signed   By: Corrie Mckusick D.O.   On: 06/15/2017 18:13   US Venous Img Lower Bilateral  Result Date: 06/18/2017 CLINICAL DATA:  Lower extremity edema for 4 weeks EXAM: BILATERAL LOWER EXTREMITY VENOUS DOPPLER ULTRASOUND TECHNIQUE: Gray-scale sonography with graded compression, as well as color Doppler and duplex ultrasound were performed to evaluate the lower extremity deep venous systems from the level of the common femoral vein and including the common femoral, femoral, profunda femoral, popliteal and calf veins including the posterior tibial, peroneal and gastrocnemius veins when visible. The superficial great saphenous vein was also interrogated. Spectral Doppler was utilized to evaluate flow at rest and with distal augmentation maneuvers in the common femoral, femoral and popliteal veins. COMPARISON:  None. FINDINGS: RIGHT LOWER EXTREMITY Common Femoral Vein: No evidence of thrombus. Normal compressibility, respiratory phasicity and response to augmentation. Saphenofemoral Junction: No evidence of thrombus. Normal compressibility and flow on color Doppler imaging. Profunda Femoral Vein: No evidence of thrombus. Normal compressibility and flow on color Doppler imaging. Femoral Vein: No evidence of thrombus. Normal compressibility, respiratory phasicity and response to augmentation. Popliteal Vein: No evidence of thrombus. Normal  compressibility, respiratory phasicity and response to augmentation. Calf Veins: No evidence of thrombus. Normal compressibility and flow on color Doppler imaging. Superficial Great  Saphenous Vein: No evidence of thrombus. Normal compressibility. Venous Reflux:  None. Other Findings:  None. LEFT LOWER EXTREMITY Common Femoral Vein: No evidence of thrombus. Normal compressibility, respiratory phasicity and response to augmentation. Saphenofemoral Junction: No evidence of thrombus. Normal compressibility and flow on color Doppler imaging. Profunda Femoral Vein: No evidence of thrombus. Normal compressibility and flow on color Doppler imaging. Femoral Vein: No evidence of thrombus. Normal compressibility, respiratory phasicity and response to augmentation. Popliteal Vein: No evidence of thrombus. Normal compressibility, respiratory phasicity and response to augmentation. Calf Veins: No evidence of thrombus. Normal compressibility and flow on color Doppler imaging. Superficial Great Saphenous Vein: No evidence of thrombus. Normal compressibility. Venous Reflux:  None. Other Findings: Subcutaneous edema is seen in left greater than right lower extremities. IMPRESSION: No evidence of deep venous thrombosis in either lower extremity. Electronically Signed   By: Ilona Sorrel M.D.   On: 06/18/2017 17:11   Dg Chest Port 1 View  Result Date: 06/19/2017 CLINICAL DATA:  Atelectasis EXAM: PORTABLE CHEST 1 VIEW COMPARISON:  Chest radiograph from one day prior. FINDINGS: Surgical suture line overlies the left lung apex. Stable cardiomediastinal silhouette with normal heart size. No pneumothorax. No pleural effusion. No pulmonary edema. Stable mild scarring at the left lung apex and at the lung bases. No acute consolidative airspace disease. IMPRESSION: Stable chest radiograph with scattered mild scarring and postsurgical changes at the left lung apex. Electronically Signed   By: Ilona Sorrel M.D.   On: 06/19/2017 07:07   Dg  Chest Portable 1 View  Result Date: 06/18/2017 CLINICAL DATA:  Dyspnea EXAM: PORTABLE CHEST 1 VIEW COMPARISON:  05/18/2017 chest radiograph. FINDINGS: Surgical suture line overlies the left lung apex. Stable cardiomediastinal silhouette with normal heart size. No pneumothorax. No pleural effusion. No pulmonary edema. Hyperinflated lungs with emphysema. Scattered mild scarring throughout both lungs, unchanged. No acute consolidative airspace disease. IMPRESSION: 1. No acute cardiopulmonary disease. Stable scattered scarring in both lungs with postsurgical changes at the left lung apex. 2. Hyperinflated lungs with emphysema, suggesting COPD. Electronically Signed   By: Ilona Sorrel M.D.   On: 06/18/2017 11:46   Dg Knee Complete 4 Views Right  Result Date: 06/18/2017 CLINICAL DATA:  84 year old female with a history of fall EXAM: RIGHT KNEE - COMPLETE 4+ VIEW COMPARISON:  None. FINDINGS: Osteopenia. No acute displaced fracture identified. Mild degenerative changes of the knee. Soft tissue swelling anterior to the patella. IMPRESSION: No acute bony abnormality. Significant prepatellar soft tissue swelling. Electronically Signed   By: Corrie Mckusick D.O.   On: 06/18/2017 11:45    Consults:    Subjective:    Overnight Issues: patient doing well, weaned off of BiPAP, presently on nasal cannula, being seen by palliative care  Objective:  Vital signs for last 24 hours: Temp:  [97.5 F (36.4 C)-99.5 F (37.5 C)] 98.3 F (36.8 C) (05/06 0751) Pulse Rate:  [75-90] 80 (05/06 1103) Resp:  [15-31] 26 (05/06 0800) BP: (104-128)/(44-108) 125/56 (05/06 0800) SpO2:  [95 %-100 %] 99 % (05/06 1103) FiO2 (%):  [32 %] 32 % (05/05 2042)  Hemodynamic parameters for last 24 hours:    Intake/Output from previous day: 05/05 0701 - 05/06 0700 In: 2051.2 [P.O.:357; I.V.:1244.2; IV Piggyback:450] Out: 850 [Urine:850]  Intake/Output this shift: Total I/O In: 50 [I.V.:50] Out: 45 [Urine:45]  Vent settings for  last 24 hours: FiO2 (%):  [32 %] 32 %  Physical Exam:   Patient awake, alert in no acute distress Vital signs: Please see  the above listed vital signs HEENT: Trachea midline, no accessory muscle utilization Cardiovascular: Regular rate and rhythm Pulmonary: Clear to auscultation Abdominal exam: Positive bowel sounds, soft exam Extremity: Trace edema  Assessment/Plan:   Acute on chronic hypercapnic respiratory failure. Patient has been weaned off of BiPAP, on Solu-Medrol, albuterol, Atrovent. Presently on Zosyn and azithromycin. At this point is doing well, will de-escalate antibiotic coverage, slow prednisone taper, replace electrolytes. Stable for floor transfer   Berwyn Bigley 06/20/2017  *Care during the described time interval was provided by me and/or other providers on the critical care team.  I have reviewed this patient's available data, including medical history, events of note, physical examination and test results as part of my evaluation.

## 2017-06-20 NOTE — Evaluation (Signed)
Physical Therapy Evaluation Patient Details Name: [name] MRN: [id number] DOB: [birth year] Today's Date: 06/20/2017   History of Present Illness  Pt is an83 y.o.femalewith a known history of CAD, end-stage COPD, anxiety, and recent lumbar spine compression fracture who presented to the emergency room due to recurrent falls, weakness, confusion. Patient had lumbar spinal compression fracture 1 month back and was started on oxycodone. She did not do well with that and was slowly tapered off it. Started on Klonopin. Presently her pain is much improved.  Patient was brought to the emergency room due to weakness and confusion. In the ED her venous blood gas showed pH of 7.26 with PCO2 106. Assessment includes: acute on chronic hypercapnic respiratory failure due to COPD exacerbationwith possible pneumonia, hypomagnesemia, recent lumbar compression fracture, and CAD.    Clinical Impression  Pt presents with deficits in strength, transfers, mobility, gait, balance, and activity tolerance.  Pt required max A with bed mobility during sup to/from sit with log roll technique training with pt and daughter secondary to lumbar compression fracture.  Pt able to maintain static sitting at EOB with close SBA for 20-30 sec before requesting to return to supine.  Pt declined transfer attempt secondary to fatigue/weakness.  Pt's SpO2 and HR WNL on 4LO2/min throughout session.  Pt will benefit from PT services in a SNF setting upon discharge to safely address above deficits for decreased caregiver assistance and eventual return to PLOF.      Follow Up Recommendations SNF;Supervision for mobility/OOB    Equipment Recommendations  Other (comment);Rolling walker with 5" wheels(TBD at next venue of care if discharges to a SNF)    Recommendations for Other Services       Precautions / Restrictions Precautions Precautions: Fall Restrictions Weight Bearing Restrictions: No      Mobility  Bed  Mobility Overal bed mobility: Needs Assistance Bed Mobility: Supine to Sit;Sit to Supine     Supine to sit: Max assist Sit to supine: Max assist   General bed mobility comments: Log roll technique education and practice secondary to lumbar compression fracture  Transfers                 General transfer comment: Pt unwilling to attempt transfer this session secondary to fatigue  Ambulation/Gait             General Gait Details: NT  Stairs            Wheelchair Mobility    Modified Rankin (Stroke Patients Only)       Balance Overall balance assessment: Needs assistance Sitting-balance support: Feet unsupported;Bilateral upper extremity supported Sitting balance-Leahy Scale: Fair Sitting balance - Comments: Limited endurance in sitting but able to maintain upright position with SBA for 20-30 sec                                     Pertinent Vitals/Pain Pain Assessment: No/denies pain    Home Living Family/patient expects to be discharged to:: Private residence(History from patient and mostly daughter at bedside) Living Arrangements: Children Available Help at Discharge: Family;Available PRN/intermittently Type of Home: House Home Access: Stairs to enter Entrance Stairs-Rails: Right;Left;Can reach both Entrance Stairs-Number of Steps: 3 Home Layout: One level Home Equipment: Cane - single point Additional Comments: Daughter is available to help following d/c intermittently.    Prior Function Level of Independence: Independent         Comments:  Ind amb limited community distances without an AD with occasional use of a SPC, multiple recent falls, Ind with ADLs     Hand Dominance        Extremity/Trunk Assessment   Upper Extremity Assessment Upper Extremity Assessment: Generalized weakness    Lower Extremity Assessment Lower Extremity Assessment: Generalized weakness;RLE deficits/detail RLE Deficits / Details: Abrasions  and edema/buising to R knee from recent fall       Communication   Communication: Other (comment)(Pt lethargic with difficulty answering questions but was able to follow 1-step commands)  Cognition Arousal/Alertness: Lethargic Behavior During Therapy: Flat affect Overall Cognitive Status: Impaired/Different from baseline Area of Impairment: Following commands;Attention                       Following Commands: Follows one step commands with increased time              General Comments      Exercises Total Joint Exercises Ankle Circles/Pumps: Strengthening;Both;10 reps Heel Slides: AAROM;Both;5 reps   Assessment/Plan    PT Assessment Patient needs continued PT services  PT Problem List Decreased strength;Decreased activity tolerance;Decreased balance;Decreased mobility       PT Treatment Interventions DME instruction;Gait training;Stair training;Functional mobility training;Balance training;Therapeutic exercise;Therapeutic activities;Patient/family education    PT Goals (Current goals can be found in the Care Plan section)  Acute Rehab PT Goals Patient Stated Goal: To get stronger at a rehab facility then return home PT Goal Formulation: With patient/family Time For Goal Achievement: 07/03/17 Potential to Achieve Goals: Fair    Frequency Min 2X/week   Barriers to discharge Inaccessible home environment;Decreased caregiver support      Co-evaluation               AM-PAC PT "6 Clicks" Daily Activity  Outcome Measure Difficulty turning over in bed (including adjusting bedclothes, sheets and blankets)?: Unable Difficulty moving from lying on back to sitting on the side of the bed? : Unable Difficulty sitting down on and standing up from a chair with arms (e.g., wheelchair, bedside commode, etc,.)?: Unable Help needed moving to and from a bed to chair (including a wheelchair)?: Total Help needed walking in hospital room?: Total Help needed climbing  3-5 steps with a railing? : Total 6 Click Score: 6    End of Session Equipment Utilized During Treatment: Oxygen Activity Tolerance: Patient limited by fatigue Patient left: in bed;with call bell/phone within reach;with bed alarm set;with family/visitor present Nurse Communication: Mobility status PT Visit Diagnosis: Repeated falls (R29.6);Muscle weakness (generalized) (M62.81);Difficulty in walking, not elsewhere classified (R26.2)    Time: 2010-0712 PT Time Calculation (min) (ACUTE ONLY): 30 min   Charges:   PT Evaluation $PT Eval Low Complexity: 1 Low PT Treatments $Therapeutic Activity: 8-22 mins   PT G Codes:        DRoyetta Asal PT, DPT 06/20/17, 11:23 AM

## 2017-06-20 NOTE — Consult Note (Signed)
Consultation Note Date: 06/20/2017   Patient Name: [name]  DOB: [birth year]  MRN: [id number]  Age / Sex: 84 y.o., female  PCP: Idelle Crouch, MD Referring Physician: Nicholes Mango, MD  Reason for Consultation: Establishing goals of care, Non pain symptom management and Terminal Care  HPI/Patient Profile: 84 y.o. female  with past medical history of  COPD, lung cancer s/p lobectomy, CAD, compression fracture, falls, and panic attacks who was admitted on 06/18/2017 with weakness and confusion.  Initial work up revealed a Ph of 7.26 and CO2 of 106. She was admitted to the ICU and required bipap.  Clinical Assessment and Goals of Care:  I have reviewed medical records including EPIC notes, labs and imaging, received report from the care team, assessed the patient and then met at the bedside along with her daughter  to discuss diagnosis prognosis, GOC, EOL wishes, disposition and options.  I introduced Palliative Medicine as specialized medical care for people living with serious illness. It focuses on providing relief from the symptoms and stress of a serious illness. The goal is to improve quality of life for both the patient and the family.  We discussed a brief life review of the patient. She currently lives in her own home with her daughter and grand daughter checking on her frequently.  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  She was also a beautician for a number of years and she loved gardening, as well as country western Mud[employer].  Her daughter explains that in January she had pneumonia but bounced back well from that.  In February and March she was driving and doing her own grocery shopping.  At the end of March she developed a non-traumatic compression fracture.  The pain from the  compression fracture has changed her life.  She is unable to do the things she used to do and now spends most of her time sleeping on the couch.  She needs assistance to stand.    Mrs. Harter was intubated once in the past.  Her mind is made up that she does not want to be intubated ever again.  She agonized over whether or not to have kyphoplasty.  When she found out she would need to be intubated for the procedure she decided not to do it.  He daughter tell me that her mother has bad lungs but other than that [name] feels her mother's problems are psychological.  She feels she is depressed.  [name] feels her mother simply took too much pain and anxiety medication at home and that is why she is in the hospital.  [name] tries to leave as much of the decision making as possible up to her mother.  When I posed the question of code status.  [name] had me ask her mother directly.  Her mother choose not to be resuscitated.  (DNR/DNI).  [name] tells me that yesterday her mother wanted to go to  [facility] for rehab, but today she is talking about dying.  Hospice and Palliative Care services outpatient were explained and offered.  We discussed hospice at home vs SNF.  [name] is considering both avenues at this time.    Questions and concerns were addressed.  Hard Choices booklet left for review. The family was encouraged to call with questions or concerns.   Primary Decision Maker:  PATIENT and daughter [name]    SUMMARY OF RECOMMENDATIONS    Will add back patient's lexapro.  She was on 10 mg bid. Change code status to DNR/DNI Continue to treat the treatable. Daughter attempting to sort out what about her mother's condition is reversible and what is not. PMT will continue to follow.   Additional Recommendations (Limitations, Scope, Preferences):  Full Scope Treatment  Palliative Prophylaxis:   Frequent Pain Assessment  Psycho-social/Spiritual:   Desire for further Chaplaincy support:  yes  Prognosis:  Uncertain.  Advanced COPD.  Severe pain secondary to compression fracture.  Rapid decline in function over the past 5 weeks.   Discharge Planning: To Be Determined SNF vs home with hospice.      Primary Diagnoses: Present on Admission: . Hypercarbia   I have reviewed the medical record, interviewed the patient and family, and examined the patient. The following aspects are pertinent.  Past Medical History:  Diagnosis Date  . Anxiety   . Cancer ([place])    lung, chemo  . COPD (chronic obstructive pulmonary disease) ([place])   . Coronary artery disease   . GERD (gastroesophageal reflux disease)   . Hyperlipidemia   . Lower extremity edema   . Lung disease   . Myocardial infarction ([place])   . Ovarian cyst 07/30/2014  . Past heart attack    "stress"- per pt has had 2.  . Personal history of chemotherapy   . Shortness of breath    Social History   Socioeconomic History  . Marital status: Widowed    Spouse name: Not on file  . Number of children: Not on file  . Years of education: Not on file  . Highest education level: Not on file  Occupational History  . Not on file  Social Needs  . Financial resource strain: Not on file  . Food insecurity:    Worry: Not on file    Inability: Not on file  . Transportation needs:    Medical: Not on file    Non-medical: Not on file  Tobacco Use  . Smoking status: Former Smoker    Packs/day: 1.50    Years: 40.00    Pack years: 60.00    Types: Cigarettes    Last attempt to quit: 10/01/2001    Years since quitting: 15.7  . Smokeless tobacco: Current User  Substance and Sexual Activity  . Alcohol use: Yes    Alcohol/week: 4.2 oz    Types: 7 Glasses of wine per week    Comment: socially  . Drug use: No  . Sexual activity: Not Currently  Lifestyle  . Physical activity:    Days per week: Not on file    Minutes per session: Not on file  . Stress: Not on file  Relationships  . Social connections:    Talks on phone:  Not on file    Gets together: Not on file    Attends religious service: Not on file    Active member of club or organization: Not on file    Attends meetings of clubs or organizations: Not on  file    Relationship status: Not on file  Other Topics Concern  . Not on file  Social History Narrative  . Not on file   Family History  Problem Relation Age of Onset  . Cancer Daughter        breast  . Breast cancer Daughter 20  . Aneurysm Mother        brain  . Hyperlipidemia Mother   . Thyroid disease Mother   . Heart failure Father   . Hyperlipidemia Father   . Scleroderma Sister   . COPD Sister   . Hyperlipidemia Sister   . Thyroid disease Sister   . Hyperlipidemia Brother   . Heart attack Brother   . Healthy Sister   . Alcohol abuse Brother    Scheduled Meds: . enoxaparin (LOVENOX) injection  40 mg Subcutaneous Q24H  . feeding supplement (ENSURE ENLIVE)  237 mL Oral TID BM  . mouth rinse  15 mL Mouth Rinse BID  . methylPREDNISolone (SOLU-MEDROL) injection  30 mg Intravenous Q24H  . mirtazapine  30 mg Oral QHS  . multivitamin with minerals  1 tablet Oral Daily  . sodium chloride flush  3 mL Intravenous Q12H   Continuous Infusions: . sodium chloride 50 mL/hr at 06/20/17 0700  . azithromycin Stopped (06/19/17 1831)  . famotidine (PEPCID) IV Stopped (06/19/17 1831)   PRN Meds:.acetaminophen **OR** acetaminophen, albuterol, ALPRAZolam, ibuprofen, ondansetron **OR** ondansetron (ZOFRAN) IV, polyethylene glycol Allergies  Allergen Reactions  . Morphine And Related Other (See Comments)    halluncinations  . Sulfa Antibiotics Itching    redness  . Sulfonamide Derivatives Itching  . Tetanus Toxoid Itching and Swelling   Review of Systems lethargic. Quickly falls back to sleep after each question  Physical Exam  Elderly female, NAD, with increased work of breathing (dtr says that is baseline), lethargic CV rrr resp no distress Abdomen soft, nt, nd Lower ext 1+  edema   Vital Signs: BP (!) 110/55   Pulse 86   Temp 97.9 F (36.6 C) (Oral)   Resp (!) 27   Ht '5\' 2"'  (1.575 m)   Wt 54.3 kg (119 lb 11.4 oz)   SpO2 98%   BMI 21.90 kg/m  Pain Scale: PAINAD POSS *See Group Information*: S-Acceptable,Sleep, easy to arouse Pain Score: 0-No pain   SpO2: SpO2: 98 % O2 Device:SpO2: 98 % O2 Flow Rate: .O2 Flow Rate (L/min): 4 L/min  IO: Intake/output summary:   Intake/Output Summary (Last 24 hours) at 06/20/2017 1326 Last data filed at 06/20/2017 1200 Gross per 24 hour  Intake 1714.17 ml  Output 1020 ml  Net 694.17 ml    LBM: Last BM Date: 06/20/17 Baseline Weight: Weight: 54.4 kg (120 lb) Most recent weight: Weight: 54.3 kg (119 lb 11.4 oz)     Palliative Assessment/Data: 20%     Time In: 11:45 Time Out: 1:00 Time Total: 75 min. Greater than 50%  of this time was spent counseling and coordinating care related to the above assessment and plan.  Signed by: Florentina Jenny, PA-C Palliative Medicine Pager: [phone]  Please contact Palliative Medicine Team phone at [phone] for questions and concerns.  For individual provider: See Shea Evans

## 2017-06-20 NOTE — NC FL2 (Signed)
[place] LEVEL OF CARE SCREENING TOOL     IDENTIFICATION  Patient Name: [name] Birthdate: Aug 23, 1933 Sex: female Admission Date (Current Location): 06/18/2017  [facility] and [place] Number:  Engineering geologist and Address:  [facility], [address] [place], [place] [address]      Provider Number: [id number]  Attending Physician Name and Address:  Nicholes Mango, MD  Relative Name and Phone Number:       Current Level of Care: Hospital Recommended Level of Care: [place] Prior Approval Number:    Date Approved/Denied:   PASRR Number:    Discharge Plan: SNF    Current Diagnoses: Patient Active Problem List   Diagnosis Date Noted  . Hypercarbia 06/18/2017  . PNA (pneumonia) 02/22/2017  . Aortic atherosclerosis ([place]) 05/20/2016  . Pulmonary nodules/lesions, multiple 12/18/2014  . Ovarian cyst 07/30/2014  . Nipple discharge 02/15/2014  . Anxiety and depression 07/24/2013  . Arterial vascular disease 07/24/2013  . Insomnia, persistent 07/24/2013  . Acid reflux 07/24/2013  . HLD (hyperlipidemia) 07/24/2013  . Cancer of upper lobe of left lung ([place]) 07/24/2013  . Peripheral blood vessel disorder ([place]) 07/24/2013  . Papilloma of left breast 11/22/2012  . CAFL (chronic airflow limitation) ([place]) 03/09/2012  . ALLERGIC RHINITIS 06/01/2007  . EMPHYSEMA 06/01/2007  . ASTHMA 06/01/2007  . DYSPNEA 06/01/2007    Orientation RESPIRATION BLADDER Height & Weight     Self  O2(4 liters ) Indwelling catheter Weight: 119 lb 11.4 oz (54.3 kg) Height:  5\' 2"  (157.5 cm)  BEHAVIORAL SYMPTOMS/MOOD NEUROLOGICAL BOWEL NUTRITION STATUS  (none) (none) Continent Diet(Regular)  AMBULATORY STATUS COMMUNICATION OF NEEDS Skin   Extensive Assist Verbally Normal                       Personal Care Assistance Level of Assistance  Bathing, Feeding, Dressing Bathing Assistance: Maximum assistance Feeding assistance:  Maximum assistance Dressing Assistance: Maximum assistance     Functional Limitations Info  (none)          SPECIAL CARE FACTORS FREQUENCY  PT (By licensed PT), OT (By licensed OT)                    Contractures Contractures Info: Not present    Additional Factors Info  Code Status, Allergies Code Status Info: Partial  Allergies Info: Sulfonamide Derivatives, Morphine, Sulfa Antibiotics, Tetanus Toxoid           Current Medications (06/20/2017):  This is the current hospital active medication list Current Facility-Administered Medications  Medication Dose Route Frequency Provider Last Rate Last Dose  . 0.9 %  sodium chloride infusion   Intravenous Continuous Hillary Bow, MD 50 mL/hr at 06/20/17 0700    . acetaminophen (TYLENOL) tablet 650 mg  650 mg Oral Q6H PRN Hillary Bow, MD       Or  . acetaminophen (TYLENOL) suppository 650 mg  650 mg Rectal Q6H PRN Sudini, Srikar, MD      . albuterol (PROVENTIL) (2.5 MG/3ML) 0.083% nebulizer solution 2.5 mg  2.5 mg Nebulization Q2H PRN Sudini, Alveta Heimlich, MD      . ALPRAZolam Duanne Moron) tablet 0.25 mg  0.25 mg Oral BID PRN Awilda Bill, NP   0.25 mg at 06/19/17 2300  . azithromycin (ZITHROMAX) 500 mg in sodium chloride 0.9 % 250 mL IVPB  500 mg Intravenous Q24H Cassandria Santee, MD   Stopped at 06/19/17 1831  . enoxaparin (LOVENOX) injection 40  mg  40 mg Subcutaneous Q24H Hillary Bow, MD   40 mg at 06/19/17 1256  . famotidine (PEPCID) IVPB 20 mg premix  20 mg Intravenous Q24H Cassandria Santee, MD   Stopped at 06/19/17 1831  . feeding supplement (ENSURE ENLIVE) (ENSURE ENLIVE) liquid 237 mL  237 mL Oral TID BM Samaan, Maged, MD   237 mL at 06/20/17 1011  . ibuprofen (ADVIL,MOTRIN) tablet 200-400 mg  200-400 mg Oral Q4H PRN Awilda Bill, NP   400 mg at 06/19/17 2259  . MEDLINE mouth rinse  15 mL Mouth Rinse BID Awilda Bill, NP   15 mL at 06/20/17 1013  . methylPREDNISolone sodium succinate (SOLU-MEDROL) 40 mg/mL injection 30  mg  30 mg Intravenous Q24H Cassandria Santee, MD   30 mg at 06/19/17 2259  . mirtazapine (REMERON) tablet 30 mg  30 mg Oral QHS Awilda Bill, NP   30 mg at 06/19/17 2300  . multivitamin with minerals tablet 1 tablet  1 tablet Oral Daily Cassandria Santee, MD   1 tablet at 06/20/17 1011  . ondansetron (ZOFRAN) tablet 4 mg  4 mg Oral Q6H PRN Hillary Bow, MD       Or  . ondansetron (ZOFRAN) injection 4 mg  4 mg Intravenous Q6H PRN Sudini, Srikar, MD      . polyethylene glycol (MIRALAX / GLYCOLAX) packet 17 g  17 g Oral Daily PRN Sudini, Srikar, MD      . sodium chloride flush (NS) 0.9 % injection 3 mL  3 mL Intravenous Q12H Sudini, Srikar, MD   3 mL at 06/20/17 1013     Discharge Medications: Please see discharge summary for a list of discharge medications.  Relevant Imaging Results:  Relevant Lab Results:   Additional Information SSN [id number]  Annamaria Boots, [place]

## 2017-06-21 ENCOUNTER — Encounter (INDEPENDENT_AMBULATORY_CARE_PROVIDER_SITE_OTHER): Payer: PPO | Admitting: Vascular Surgery

## 2017-06-21 DIAGNOSIS — J9601 Acute respiratory failure with hypoxia: Secondary | ICD-10-CM

## 2017-06-21 DIAGNOSIS — R0689 Other abnormalities of breathing: Secondary | ICD-10-CM

## 2017-06-21 MED ORDER — LORAZEPAM 1 MG PO TABS
1.0000 mg | ORAL_TABLET | Freq: Every day | ORAL | Status: DC
  Administered 2017-06-21: 1 mg via ORAL
  Filled 2017-06-21: qty 1

## 2017-06-21 MED ORDER — MORPHINE SULFATE (CONCENTRATE) 10 MG/0.5ML PO SOLN
2.5000 mg | ORAL | Status: DC | PRN
Start: 2017-06-21 — End: 2017-06-22
  Administered 2017-06-21 – 2017-06-22 (×2): 2.6 mg via SUBLINGUAL
  Filled 2017-06-21 (×2): qty 1

## 2017-06-21 MED ORDER — METHYLPREDNISOLONE SODIUM SUCC 40 MG IJ SOLR
10.0000 mg | INTRAMUSCULAR | Status: DC
  Administered 2017-06-21: 10 mg via INTRAVENOUS
  Filled 2017-06-21: qty 1

## 2017-06-21 MED ORDER — MEGESTROL ACETATE 20 MG PO TABS
40.0000 mg | ORAL_TABLET | Freq: Every day | ORAL | Status: DC
  Filled 2017-06-21 (×2): qty 2

## 2017-06-21 MED ORDER — FAMOTIDINE 20 MG PO TABS: 20.0000 mg | ORAL_TABLET | Freq: Every day | ORAL | Status: DC

## 2017-06-21 MED ORDER — LORAZEPAM 0.5 MG PO TABS
0.5000 mg | ORAL_TABLET | Freq: Two times a day (BID) | ORAL | Status: DC
  Administered 2017-06-21: 0.5 mg via ORAL
  Filled 2017-06-21: qty 1

## 2017-06-21 MED ORDER — PANTOPRAZOLE SODIUM 40 MG PO TBEC
40.0000 mg | DELAYED_RELEASE_TABLET | Freq: Every day | ORAL | Status: DC
  Administered 2017-06-21: 40 mg via ORAL
  Filled 2017-06-21: qty 1

## 2017-06-21 MED ORDER — FENTANYL CITRATE (PF) 100 MCG/2ML IJ SOLN: 12.5000 ug | INTRAMUSCULAR | Status: DC | PRN

## 2017-06-21 NOTE — Progress Notes (Signed)
Daily Progress Note   Patient Name: [name]       Date: 06/21/2017 DOB: 10-03-1933  Age: 84 y.o. MRN#: [id number] Attending Physician: Nicholes Mango, MD Primary Care Physician: Idelle Crouch, MD Admit Date: 06/18/2017  Reason for Consultation/Follow-up: Establishing goals of care, Non pain symptom management and Psychosocial/spiritual support  Subjective: Patient has been talking about dying since yesterday.  She does not want family members to leave her side.  She tells her daughter "I'm trying hard to die and you are trying to keep me here".  She says to the family "I'm tired and don't want to fight to breathe any longer"  We talked with the family privately in the conference room.  They expressed disbelief that their mother was actually dying.  Are her lungs really that bad?   We discussed that yes - due to a history of lobectomy and her propensity to become hypercarbic her lungs really are that bad.  The patient has been struggling with severe pain and anxiety since her compression fracture 3/31.  We discussed strategy to reduce her anxiety by scheduling low dose benzodiazepine exactly as she had it at home.  We also decided to try low dose SL morphine despite her previous allergy to morphine (post operative).  Our goal is to support [name] making her as comfortable and happy as possible.  Her family is becoming more open to the idea that she may actually be dying.  Ideally they would like to take her home with hospice if we can get her stabilized.  If she does not stabilize we will likely discuss hospice house.   Assessment: 84 yo female with end stage lung disease, rapid physical decline, rapid decline in eating, and spiritually asking to die.   Dyspneic at rest and anxious.   Her oxygen requirement has increased to 5L from her normal 2L.   Patient Profile/HPI:  84 y.o. female  with past medical history of  COPD, lung cancer s/p lobectomy, CAD, compression fracture, falls, and panic attacks who was admitted on 06/18/2017 with weakness and confusion. Initial work up revealed a Ph of 7.26 and CO2 of 106. She was admitted to the ICU and required bipap.  Length of Stay: 3  Current Medications: Scheduled Meds:  . enoxaparin (LOVENOX) injection  40 mg Subcutaneous  Q24H  . escitalopram  10 mg Oral BID  . feeding supplement (ENSURE ENLIVE)  237 mL Oral TID BM  . LORazepam  0.5 mg Oral BID  . LORazepam  1 mg Oral QHS  . mouth rinse  15 mL Mouth Rinse BID  . megestrol  40 mg Oral Daily  . methylPREDNISolone (SOLU-MEDROL) injection  30 mg Intravenous Q24H  . mirtazapine  30 mg Oral QHS  . pantoprazole  40 mg Oral Daily  . sodium chloride flush  3 mL Intravenous Q12H    Continuous Infusions: . sodium chloride 50 mL/hr at 06/21/17 0605  . azithromycin Stopped (06/20/17 1901)    PRN Meds: acetaminophen **OR** acetaminophen, albuterol, fentaNYL (SUBLIMAZE) injection, ibuprofen, morphine CONCENTRATE, ondansetron **OR** ondansetron (ZOFRAN) IV, polyethylene glycol  Physical Exam       Elderly frail,female, dyspneic at rest.  She is suspicious of me. CV tachy resp mildly increased work of breathing.     Vital Signs: BP (!) 132/56 (BP Location: Left Arm)   Pulse 94   Temp 98.2 F (36.8 C) (Oral)   Resp (!) 25   Ht 5\' 2"  (1.575 m)   Wt 58.3 kg (128 lb 8.5 oz)   SpO2 91%   BMI 23.51 kg/m  SpO2: SpO2: 91 % O2 Device: O2 Device: Nasal Cannula O2 Flow Rate: O2 Flow Rate (L/min): 5 L/min  Intake/output summary:   Intake/Output Summary (Last 24 hours) at 06/21/2017 1550 Last data filed at 06/21/2017 1323 Gross per 24 hour  Intake 1873 ml  Output 725 ml  Net 1148 ml   LBM: Last BM Date: 06/20/17 Baseline Weight: Weight: 54.4 kg (120 lb) Most recent weight:  Weight: 58.3 kg (128 lb 8.5 oz)       Palliative Assessment/Data:  20%      Patient Active Problem List   Diagnosis Date Noted  . Malnutrition of moderate degree 06/20/2017  . Acute respiratory failure with hypoxia and hypercapnia (HCC)   . Palliative care encounter   . Encounter for hospice care discussion   . Goals of care, counseling/discussion   . DNR (do not resuscitate)   . Hypercarbia 06/18/2017  . PNA (pneumonia) 02/22/2017  . Aortic atherosclerosis ([place]) 05/20/2016  . Pulmonary nodules/lesions, multiple 12/18/2014  . Ovarian cyst 07/30/2014  . Nipple discharge 02/15/2014  . Anxiety and depression 07/24/2013  . Arterial vascular disease 07/24/2013  . Insomnia, persistent 07/24/2013  . Acid reflux 07/24/2013  . HLD (hyperlipidemia) 07/24/2013  . Cancer of upper lobe of left lung ([place]) 07/24/2013  . Peripheral blood vessel disorder ([place]) 07/24/2013  . Papilloma of left breast 11/22/2012  . CAFL (chronic airflow limitation) ([place]) 03/09/2012  . ALLERGIC RHINITIS 06/01/2007  . EMPHYSEMA 06/01/2007  . ASTHMA 06/01/2007  . DYSPNEA 06/01/2007    Palliative Care Plan    Recommendations/Plan:  Schedule lorazepam as it is at home.  Lexapro restarted yesterday  Will trial low dose morphine for dyspnea.  Monitor closely for hallucinations  Family considering home with hospice if she stabilizes.  PMT will continue to follow with you.    Per Pulm she may qualify for hospice house this may be a better option for the family if she qualifies.  Code Status:  DNR  Prognosis:   < 2 weeks patient at high risk of becoming hypercarbic and declining very quickly.   Discharge Planning:  To Be Determined possibly home with hospice vs hospice house.  Care plan was discussed with family, bedside RN, attending physician and pulmonology.  Thank you for allowing the Palliative Medicine Team to assist in the care of this patient.  Total time spent:  45 min.     Greater  than 50%  of this time was spent counseling and coordinating care related to the above assessment and plan.  Florentina Jenny, PA-C Palliative Medicine  Please contact Palliative MedicineTeam phone at [phone] for questions and concerns between 7 am - 7 pm.   Please see AMION for individual provider pager numbers.

## 2017-06-21 NOTE — Progress Notes (Signed)
[facility] [place] NAME: [name]    MR#:  [id number]  DATE OF BIRTH:  06/21/1933  SUBJECTIVE:  CHIEF COMPLAINT:  Pts working hard to breathe refused BiPAP last night, Daughter [name] is at bedside and reports this mom's baseline, also reported in fact mom is breathing better than yesterday and refused ABGs.  Daughter wants to talk to pulmonologist, seen by Jamal Collin before palliative care is following  REVIEW OF SYSTEMS:  Review of system unobtainable , patient is lethargic but arousable  DRUG ALLERGIES:   Allergies  Allergen Reactions  . Morphine And Related Other (See Comments)    halluncinations  . Sulfa Antibiotics Itching    redness  . Sulfonamide Derivatives Itching  . Tetanus Toxoid Itching and Swelling    VITALS:  Blood pressure (!) 139/58, pulse 93, temperature 98.2 F (36.8 C), temperature source Oral, resp. rate (!) 26, height 5\' 2"  (1.575 m), weight 58.3 kg (128 lb 8.5 oz), SpO2 93 %.  PHYSICAL EXAMINATION:  GENERAL:  84 y.o.-year-old patient lying in the bed with no acute distress.  EYES: Pupils equal, round, reactive to light and accommodation. No scleral icterus. Extraocular muscles intact.  HEENT: Head atraumatic, normocephalic. Oropharynx and nasopharynx clear.  NECK:  Supple, no jugular venous distention. No thyroid enlargement, no tenderness.  LUNGS: mod breath sounds bilaterally, no wheezing, rales,rhonchi or crepitation. some use of accessory muscles of respiration, according to the daughter that is patient's baseline.  Off BiPAP CARDIOVASCULAR: S1, S2 normal. No murmurs, rubs, or gallops.  ABDOMEN: Soft, nontender, nondistended. Bowel sounds present. No organomegaly or mass.  EXTREMITIES: No pedal edema, cyanosis, or clubbing.  NEUROLOGIC: Arousable but delirious PSYCHIATRIC: The patient is arousable but delirious SKIN: No obvious rash, lesion, or ulcer.    LABORATORY PANEL:   CBC Recent Labs   Lab 06/20/17 0455  WBC 11.0  HGB 9.2*  HCT 27.2*  PLT 481*   ------------------------------------------------------------------------------------------------------------------  Chemistries  Recent Labs  Lab 06/18/17 1106  06/20/17 0455  NA 131*   < > 139  K 3.5   < > 3.5  CL 84*   < > 94*  CO2 39*   < > 40*  GLUCOSE 144*   < > 87  BUN 15   < > 13  CREATININE 0.77   < > 0.72  CALCIUM 8.5*   < > 7.9*  MG  --    < > 2.2  AST 35  --   --   ALT 30  --   --   ALKPHOS 84  --   --   BILITOT 0.4  --   --    < > = values in this interval not displayed.   ------------------------------------------------------------------------------------------------------------------  Cardiac Enzymes Recent Labs  Lab 06/18/17 1106  TROPONINI 0.05*   ------------------------------------------------------------------------------------------------------------------  RADIOLOGY:  No results found.  EKG:   Orders placed or performed during the hospital encounter of 06/18/17  . ED EKG  . ED EKG  . EKG 12-Lead  . EKG 12-Lead    ASSESSMENT AND PLAN:   *Delirium from CO2 narcosis CT head with no acute findings   *Acute on chronic hypercapnic respiratory failure due to COPD exacerbation possible pneumonia  clinically not doing good and is refusing BiPAP, discussed with pulmonology Dr. Ashby Dawes to round on the patient and discussed with patient's daughter .taper IV steroids, nebulizers.   off BiPAP, patient refused BiPAP last night Chest x-ray stable chest radiograph with scattered  mild scarring and postsurgical changes  Antibiotics Zosyn and azithromycin initially started discontinued Zosyn patient currently on azithromycin,   *Hypomagnesemia replete and recheck magnesium at 2.2  *Recent compression fracture.  Pain is much improved.  She is following up with the spine surgeon at [facility] for possible kyphoplasty if pain does not improve. High risk for surgery.  *CAD.   Stable. Mildly elevated Troponin 0 0.05 secondary to demand ischemia.  Echocardiogram -ejection fraction 60 to 65%.  Wall motion was normal.  No regional wall motion abnormalities.  Mild aortic valve regurgitation  DVT prophylaxis with Lovenox  * FTT -seen by palliative care, disposition to be determined: Includes skilled nursing facility versus home with hospice care.  Daughter prefers skilled nursing facility     All the records are reviewed and case discussed with Care Management/Social Workerr. Management plans discussed with the patient, family and they are in agreement.  CODE STATUS: PARTIAL CODE , okay with CPR, agreeable with defibrillation and chemical code, refusing intubation  patient's daughter [name] Is the healthcare POA  TOTAL TIME TAKING CARE OF THIS PATIENT: 35 minutes.   POSSIBLE D/C IN 1-2 DAYS, DEPENDING ON CLINICAL CONDITION.  Note: This dictation was prepared with Dragon dictation along with smaller phrase technology. Any transcriptional errors that result from this process are unintentional.   [name] M.D on 06/21/2017 at 1:25 PM  Between 7am to 6pm - Pager - [phone] After 6pm go to [email or web address] - password [email or web address] [facility]  Office  [phone]  CC: Primary care physician; Jody Crouch, MD

## 2017-06-21 NOTE — Care Management Important Message (Signed)
Copy of signed IM left in patient's room.    

## 2017-06-21 NOTE — Progress Notes (Signed)
* [facility]     Assessment and Plan:  End-stage COPD/emphysema with acute on chronic hypercapnic respiratory failure. -Currently on solumedrol 30 mg daily, currently on oxygen at 5L.  --Pt. Intolerant of bipap. Pt is at high risk of further decompensation and death.   End of life/palliative discussion.  --Discussed with pt's daughter via telephone, explained that patient is likely end stage, her functional status has declined in the past few months. Given her severe deconditioning/debility, severe emphysema, and vertebral compression fractures, her chance of recovery is poor.  At this time patient is expressing desire to be allowed to pass away. She is alert and oriented times 3. I explained to daughter that pt's request is reasonable, and that my guess at her expected survival would be days to weeks.I think hospice at home or inpatient hospice would be in line with the patient's wishes and would recommend hospice referral.    Date: 06/21/2017  MRN# [id number] [name] 22-Aug-1933   [name] is a 84 y.o. old female seen in follow up for chief complaint of  Chief Complaint  Patient presents with  . Fall  . Weakness     HPI:  The patient is an 84 year old female with history of lung cancer status post lobectomy, history of COPD, coronary disease, anxiety, spinal compression fracture.  She is currently admitted to the hospital with acute exacerbation of COPD.  Family notes that she had a good functional status until a few months ago when she experienced a spinal compression fracture since then her functional status has progressively declined.  She declined having a kyphoplasty due to concerns about being intubated as she does not want to be intubated. She is seen in our clinic by Dr. Alva Garnet, previously by Dr. Stevenson Clinch, she was last seen about a month ago.  She is noted to be on chronic oxygen therapy and noted that she has struggled with her breathing with  basic activities such as walking around her home which apparently has been her new baseline.  It was thought that she would be at high risk for any procedure involving general anesthesia and intubation.  The patient was brought into the hospital on 06/18/2017 after falls at home, she was found by EMS with confusion and worsening breathing.  She was initially placed on BiPAP, admitted to the ICU and treated empirically for possible pneumonia as well as COPD exacerbation.  She was started on analgesia for spinal compression fractures.  The patient is DNR, she expressed the wish that she does not want to be intubated, she has since refused BiPAP and is considering hospice/palliative care, palliative care services have been consulted.  Review of blood gases on 08/18/2017 shows pH 7.4 2/70/98/40 6.7, consistent with chronic hypercapnic respiratory failure, review of imaging personally, chest x-ray 06/19/2017, severe hyperinflation consistent with severe emphysema.    Medication:    Current Facility-Administered Medications:  .  0.9 %  sodium chloride infusion, , Intravenous, Continuous, Sudini, Srikar, MD, Last Rate: 50 mL/hr at 06/21/17 0605 .  acetaminophen (TYLENOL) tablet 650 mg, 650 mg, Oral, Q6H PRN **OR** acetaminophen (TYLENOL) suppository 650 mg, 650 mg, Rectal, Q6H PRN, Sudini, Srikar, MD .  albuterol (PROVENTIL) (2.5 MG/3ML) 0.083% nebulizer solution 2.5 mg, 2.5 mg, Nebulization, Q2H PRN, Sudini, Srikar, MD .  ALPRAZolam Duanne Moron) tablet 0.25 mg, 0.25 mg, Oral, BID PRN, Awilda Bill, NP, 0.25 mg at 06/20/17 1815 .  azithromycin (ZITHROMAX) 500 mg in sodium chloride 0.9 % 250 mL  IVPB, 500 mg, Intravenous, Q24H, Cassandria Santee, MD, Stopped at 06/20/17 1901 .  enoxaparin (LOVENOX) injection 40 mg, 40 mg, Subcutaneous, Q24H, Sudini, Srikar, MD, 40 mg at 06/20/17 1258 .  escitalopram (LEXAPRO) tablet 10 mg, 10 mg, Oral, BID, Dellinger, Marianne L, PA-C, 10 mg at 06/21/17 0931 .  famotidine (PEPCID)  tablet 20 mg, 20 mg, Oral, Daily, Gouru, Aruna, MD .  feeding supplement (ENSURE ENLIVE) (ENSURE ENLIVE) liquid 237 mL, 237 mL, Oral, TID BM, Samaan, Maged, MD, 237 mL at 06/21/17 0952 .  ibuprofen (ADVIL,MOTRIN) tablet 200-400 mg, 200-400 mg, Oral, Q4H PRN, Awilda Bill, NP, 400 mg at 06/21/17 0952 .  MEDLINE mouth rinse, 15 mL, Mouth Rinse, BID, Awilda Bill, NP, 15 mL at 06/21/17 0931 .  methylPREDNISolone sodium succinate (SOLU-MEDROL) 40 mg/mL injection 30 mg, 30 mg, Intravenous, Q24H, Samaan, Maged, MD, 30 mg at 06/20/17 1951 .  mirtazapine (REMERON) tablet 30 mg, 30 mg, Oral, QHS, Blakeney, Dana G, NP, 30 mg at 06/20/17 2113 .  multivitamin with minerals tablet 1 tablet, 1 tablet, Oral, Daily, Soyla Murphy, Maged, MD, 1 tablet at 06/21/17 0931 .  ondansetron (ZOFRAN) tablet 4 mg, 4 mg, Oral, Q6H PRN **OR** ondansetron (ZOFRAN) injection 4 mg, 4 mg, Intravenous, Q6H PRN, Sudini, Srikar, MD .  pantoprazole (PROTONIX) EC tablet 40 mg, 40 mg, Oral, Daily, Gouru, Aruna, MD, 40 mg at 06/21/17 0952 .  polyethylene glycol (MIRALAX / GLYCOLAX) packet 17 g, 17 g, Oral, Daily PRN, Sudini, Srikar, MD .  sodium chloride flush (NS) 0.9 % injection 3 mL, 3 mL, Intravenous, Q12H, Sudini, Srikar, MD, 3 mL at 06/21/17 0931   Allergies:  Morphine and related; Sulfa antibiotics; Sulfonamide derivatives; and Tetanus toxoid  Review of Systems: Gen:  Denies  fever, sweats. HEENT: Denies blurred vision. Cvc:  No dizziness, chest pain or heaviness Pt complains of dyspnea.  Other:  All other systems were reviewed and found to be negative other than what is mentioned in the HPI.   Physical Examination:   VS: BP (!) 129/59 (BP Location: Left Arm)   Pulse 92   Temp 98 F (36.7 C) (Oral)   Resp (!) 24   Ht 5\' 2"  (1.575 m)   Wt 128 lb 8.5 oz (58.3 kg)   SpO2 100%   BMI 23.51 kg/m    General Appearance: No distress  Neuro:without focal findings,  speech normal but slow.  HEENT: PERRLA, EOM  intact. Pulmonary: normal breath sounds, decreased air entry bilaterally.  CardiovascularNormal S1,S2.  No m/r/g.   Abdomen: Benign, Soft, non-tender. Renal:  No costovertebral tenderness  GU:  Not performed at this time. Endoc: No evident thyromegaly, no signs of acromegaly. Skin:   warm, no rash. Extremities: normal, no cyanosis, clubbing.   LABORATORY PANEL:   CBC Recent Labs  Lab 06/20/17 0455  WBC 11.0  HGB 9.2*  HCT 27.2*  PLT 481*   ------------------------------------------------------------------------------------------------------------------  Chemistries  Recent Labs  Lab 06/18/17 1106  06/20/17 0455  NA 131*   < > 139  K 3.5   < > 3.5  CL 84*   < > 94*  CO2 39*   < > 40*  GLUCOSE 144*   < > 87  BUN 15   < > 13  CREATININE 0.77   < > 0.72  CALCIUM 8.5*   < > 7.9*  MG  --    < > 2.2  AST 35  --   --   ALT 30  --   --  ALKPHOS 84  --   --   BILITOT 0.4  --   --    < > = values in this interval not displayed.   ------------------------------------------------------------------------------------------------------------------  Cardiac Enzymes Recent Labs  Lab 06/18/17 1106  TROPONINI 0.05*   ------------------------------------------------------------  RADIOLOGY:   No results found for this or any previous visit. Results for orders placed during the hospital encounter of 05/18/17  DG Chest 2 View   Narrative CLINICAL DATA:  Pneumonia.  EXAM: CHEST - 2 VIEW  COMPARISON:  02/22/2017  FINDINGS: The cardiomediastinal silhouette is within normal limits. Aortic atherosclerosis is noted. Postsurgical changes are again seen in the left upper lobe. Asymmetric right mid lung and right lung base opacity on the prior study has resolved. The interstitial markings are increased diffusely in a symmetric fashion and mildly nodular, stable to mildly increased in the left lung compared to the prior study and also overall appearing increased bilaterally  comparing to an older study from 05/09/2015. A small left midlung nodule is unchanged. No pleural effusion or pneumothorax is identified. A thoracic compression fracture was evaluated in detail on a 05/12/2017 thoracic spine MRI. Right upper quadrant abdominal surgical clips are noted.  IMPRESSION: 1. Resolution of asymmetric right lung opacity from the prior study. 2. Diffusely increased and mildly nodular interstitial markings bilaterally in a symmetric fashion, at least partly related to underlying COPD though superimposed atypical infection is a consideration given the worsening from 2017.   Electronically Signed   By: Logan Bores M.D.   On: 05/19/2017 08:57    ------------------------------------------------------------------------------------------------------------------  Thank  you for allowing [facility] to assist in the care of your patient. Our recommendations are noted above.  Please contact us if we can be of further service.   Marda Stalker, MD.  [facility] Office Number: [phone]  Patricia Pesa, M.D.  Merton Border, M.D  06/21/2017

## 2017-06-21 NOTE — Progress Notes (Signed)
PT Cancellation Note  Patient Details Name: [name] MRN: [id number] DOB: 1933/08/24   Cancelled Treatment:    Reason Eval/Treat Not Completed: Patient declined, no reason specified. Treatment attempted; pt is not interested in PT. Re attempt at a later date.    Larae Grooms, PTA 06/21/2017, 4:59 PM

## 2017-06-21 NOTE — Progress Notes (Signed)
PHARMACIST - PHYSICIAN COMMUNICATION  DR:   Margaretmary Eddy  CONCERNING: IV to Oral Route Change Policy  RECOMMENDATION: This patient is receiving famotidine by the intravenous route.  Based on criteria approved by the Pharmacy and Therapeutics Committee, the intravenous medication(s) is/are being converted to the equivalent oral dose form(s).   DESCRIPTION: These criteria include:  The patient is eating (either orally or via tube) and/or has been taking other orally administered medications for a least 24 hours  The patient has no evidence of active gastrointestinal bleeding or impaired GI absorption (gastrectomy, short bowel, patient on TNA or NPO).  If you have questions about this conversion, please contact the Pharmacy Department  []   [phone] )  [facility] [x]   [phone] )  [facility] []   [phone] )  [facility] []   [phone] )  [facility] []   [phone] )  [place], [facility] 06/21/2017 10:46 AM

## 2017-06-22 DIAGNOSIS — Z515 Encounter for palliative care: Secondary | ICD-10-CM

## 2017-06-22 MED ORDER — HYDROMORPHONE HCL 1 MG/ML IJ SOLN: 0.5000 mg | INTRAMUSCULAR | 0 refills | Status: AC | PRN

## 2017-06-22 MED ORDER — ACETAMINOPHEN 325 MG PO TABS: 650.0000 mg | ORAL_TABLET | Freq: Four times a day (QID) | ORAL | Status: DC | PRN

## 2017-06-22 MED ORDER — LORAZEPAM 2 MG/ML IJ SOLN: 1.0000 mg | INTRAMUSCULAR | Status: DC

## 2017-06-22 MED ORDER — ONDANSETRON HCL 4 MG PO TABS: 4.0000 mg | ORAL_TABLET | Freq: Four times a day (QID) | ORAL | 0 refills | Status: AC | PRN

## 2017-06-22 MED ORDER — HALOPERIDOL LACTATE 2 MG/ML PO CONC
0.5000 mg | ORAL | Status: DC | PRN
  Filled 2017-06-22: qty 0.3

## 2017-06-22 MED ORDER — LORAZEPAM 2 MG/ML IJ SOLN: 1.0000 mg | INTRAMUSCULAR | 0 refills | Status: AC | PRN

## 2017-06-22 MED ORDER — HALOPERIDOL 0.5 MG PO TABS
0.5000 mg | ORAL_TABLET | ORAL | Status: DC | PRN
  Filled 2017-06-22: qty 1

## 2017-06-22 MED ORDER — MIRTAZAPINE 30 MG PO TABS: 30.0000 mg | ORAL_TABLET | Freq: Every day | ORAL | Status: AC

## 2017-06-22 MED ORDER — POLYVINYL ALCOHOL 1.4 % OP SOLN: 1.0000 [drp] | Freq: Four times a day (QID) | OPHTHALMIC | 0 refills | Status: AC | PRN

## 2017-06-22 MED ORDER — HALOPERIDOL LACTATE 5 MG/ML IJ SOLN
0.5000 mg | INTRAMUSCULAR | Status: DC | PRN
  Filled 2017-06-22: qty 0.1

## 2017-06-22 MED ORDER — GLYCOPYRROLATE 0.2 MG/ML IJ SOLN
0.2000 mg | INTRAMUSCULAR | Status: DC | PRN
  Filled 2017-06-22: qty 1

## 2017-06-22 MED ORDER — LORAZEPAM 1 MG PO TABS: 1.0000 mg | ORAL_TABLET | Freq: Every day | ORAL | 0 refills | Status: AC

## 2017-06-22 MED ORDER — PANTOPRAZOLE SODIUM 40 MG PO TBEC: 40.0000 mg | DELAYED_RELEASE_TABLET | Freq: Every day | ORAL | Status: AC

## 2017-06-22 MED ORDER — MORPHINE SULFATE (CONCENTRATE) 10 MG/0.5ML PO SOLN
2.5000 mg | ORAL | Status: DC | PRN
  Administered 2017-06-22: 5 mg via SUBLINGUAL
  Filled 2017-06-22: qty 1

## 2017-06-22 MED ORDER — ACETAMINOPHEN 650 MG RE SUPP: 650.0000 mg | Freq: Four times a day (QID) | RECTAL | Status: DC | PRN

## 2017-06-22 MED ORDER — GLYCOPYRROLATE 1 MG PO TABS: 1.0000 mg | ORAL_TABLET | ORAL | Status: AC | PRN

## 2017-06-22 MED ORDER — HALOPERIDOL 0.5 MG PO TABS: 0.5000 mg | ORAL_TABLET | ORAL | Status: AC | PRN

## 2017-06-22 MED ORDER — GLYCOPYRROLATE 1 MG PO TABS
1.0000 mg | ORAL_TABLET | ORAL | Status: DC | PRN
  Filled 2017-06-22: qty 1

## 2017-06-22 MED ORDER — ACETAMINOPHEN 325 MG PO TABS: 650.0000 mg | ORAL_TABLET | Freq: Four times a day (QID) | ORAL | Status: AC | PRN

## 2017-06-22 MED ORDER — POLYVINYL ALCOHOL 1.4 % OP SOLN
1.0000 [drp] | Freq: Four times a day (QID) | OPHTHALMIC | Status: DC | PRN
  Filled 2017-06-22: qty 15

## 2017-06-22 MED ORDER — SODIUM CHLORIDE 0.9 % IV SOLN: INTRAVENOUS | Status: DC

## 2017-06-22 MED ORDER — HYDROMORPHONE HCL 1 MG/ML IJ SOLN
0.5000 mg | INTRAMUSCULAR | Status: DC | PRN
  Administered 2017-06-22: 0.5 mg via INTRAVENOUS
  Filled 2017-06-22: qty 0.5

## 2017-06-22 MED ORDER — LORAZEPAM 2 MG/ML IJ SOLN
1.0000 mg | INTRAMUSCULAR | Status: DC | PRN
  Administered 2017-06-22 (×2): 1 mg via INTRAVENOUS
  Filled 2017-06-22 (×3): qty 1

## 2017-06-22 MED ORDER — BIOTENE DRY MOUTH MT LIQD: 15.0000 mL | OROMUCOSAL | Status: DC | PRN

## 2017-06-22 MED ORDER — ALBUTEROL SULFATE (2.5 MG/3ML) 0.083% IN NEBU: 2.5000 mg | INHALATION_SOLUTION | RESPIRATORY_TRACT | 12 refills | Status: AC | PRN

## 2017-06-22 MED ORDER — LORAZEPAM 0.5 MG PO TABS: 0.5000 mg | ORAL_TABLET | Freq: Two times a day (BID) | ORAL | 0 refills | Status: AC

## 2017-06-22 NOTE — Progress Notes (Signed)
Chaplain provided pastoral presence and prayer for the family as they continued to process the decision to move the patient to Hospice care. Chaplain offered emotional support and active listening. The family is confident in their decision.

## 2017-06-22 NOTE — Progress Notes (Signed)
[name] to be D/C'd to hospice house per MD order.  Discussed prescriptions and follow up appointments with the patient. Prescriptions given to patient, medication list explained in detail. Pt verbalized understanding.  Allergies as of 06/22/2017      Reactions   Morphine And Related Other (See Comments)   halluncinations   Sulfa Antibiotics Itching   redness   Sulfonamide Derivatives Itching   Tetanus Toxoid Itching, Swelling      Medication List    STOP taking these medications   ADVAIR DISKUS 250-50 MCG/DOSE Aepb Generic drug:  Fluticasone-Salmeterol   albuterol 108 (90 Base) MCG/ACT inhaler Commonly known as:  PROVENTIL HFA;VENTOLIN HFA Replaced by:  albuterol (2.5 MG/3ML) 0.083% nebulizer solution   atorvastatin 20 MG tablet Commonly known as:  LIPITOR   calcitonin (salmon) 200 UNIT/ACT nasal spray Commonly known as:  MIACALCIN/FORTICAL   CALCIUM 600 + D PO   CLEAR EYES OP   clonazePAM 1 MG tablet Commonly known as:  KLONOPIN   escitalopram 10 MG tablet Commonly known as:  LEXAPRO   furosemide 40 MG tablet Commonly known as:  LASIX   ibuprofen 200 MG tablet Commonly known as:  ADVIL,MOTRIN   megestrol 400 MG/10ML suspension Commonly known as:  MEGACE   multivitamin with minerals Tabs tablet   omeprazole 40 MG capsule Commonly known as:  PRILOSEC   senna-docusate 8.6-50 MG tablet Commonly known as:  Senokot-S   sodium chloride 0.65 % Soln nasal spray Commonly known as:  OCEAN   SPIRIVA HANDIHALER 18 MCG inhalation capsule Generic drug:  tiotropium     TAKE these medications   acetaminophen 325 MG tablet Commonly known as:  TYLENOL Take 2 tablets (650 mg total) by mouth every 6 (six) hours as needed for mild pain (or Fever >/= 101).   albuterol (2.5 MG/3ML) 0.083% nebulizer solution Commonly known as:  PROVENTIL Take 3 mLs (2.5 mg total) by nebulization every 2 (two) hours as needed for wheezing. Replaces:  albuterol 108 (90 Base) MCG/ACT  inhaler   glycopyrrolate 1 MG tablet Commonly known as:  ROBINUL Take 1 tablet (1 mg total) by mouth every 4 (four) hours as needed (excessive secretions).   haloperidol 0.5 MG tablet Commonly known as:  HALDOL Take 1 tablet (0.5 mg total) by mouth every 4 (four) hours as needed for agitation (or delirium).   HYDROmorphone 1 MG/ML injection Commonly known as:  DILAUDID Inject 0.5 mLs (0.5 mg total) into the vein every 2 (two) hours as needed for severe pain (dyspnea, and comfort at end of life.).   LORazepam 0.5 MG tablet Commonly known as:  ATIVAN Take 1 tablet (0.5 mg total) by mouth 2 (two) times daily.   LORazepam 1 MG tablet Commonly known as:  ATIVAN Take 1 tablet (1 mg total) by mouth at bedtime.   LORazepam 2 MG/ML injection Commonly known as:  ATIVAN Inject 0.5 mLs (1 mg total) into the vein every 2 (two) hours as needed for anxiety.   mirtazapine 30 MG tablet Commonly known as:  REMERON Take 1 tablet (30 mg total) by mouth at bedtime. What changed:  when to take this   ondansetron 4 MG tablet Commonly known as:  ZOFRAN Take 1 tablet (4 mg total) by mouth every 6 (six) hours as needed for nausea.   pantoprazole 40 MG tablet Commonly known as:  PROTONIX Take 1 tablet (40 mg total) by mouth daily. Start taking on:  06/23/2017   polyvinyl alcohol 1.4 % ophthalmic solution Commonly  known as:  LIQUIFILM TEARS Place 1 drop into both eyes 4 (four) times daily as needed for dry eyes.       Vitals:   06/21/17 1250 06/21/17 1329  BP: (!) 139/58 (!) 132/56  Pulse: 93 94  Resp: (!) 26 (!) 25  Temp: 98.2 F (36.8 C)   SpO2: 93% 91%    Skin clean, dry and intact without evidence of skin break down, no evidence of skin tears noted. IV catheter discontinued intact. Site without signs and symptoms of complications. Dressing and pressure applied. Pt denies pain at this time. No complaints noted.  An After Visit Summary was printed and given to the patient. pt  discharged via EMS to facility. Family at bedside. DNR form included.  Noris Kulinski A

## 2017-06-22 NOTE — Clinical Social Work Note (Signed)
Clinical Social Work Assessment  Patient Details  Name: [name] MRN: [id number] Date of Birth: 09/17/1933  Date of referral:  06/22/17               Reason for consult:  Discharge Planning                Permission sought to share information with:  Facility Sport and exercise psychologist, Family Supports Permission granted to share information::     Name::        Agency::     Relationship::     Contact Information:     Housing/Transportation Living arrangements for the past 2 months:  Single Family Home Source of Information:  Adult Children(Debra Therapist, art ) Patient Interpreter Needed:  None Criminal Activity/Legal Involvement Pertinent to Current Situation/Hospitalization:  No - Comment as needed Significant Relationships:  Adult Children Lives with:  Self Do you feel safe going back to the place where you live?    Need for family participation in patient care:  Yes (Comment)  Care giving concerns:  Patient is unable to participate in assessment due to sedation    Social Worker assessment / plan:  CSW consulted for discharge planning. PT has recommended SNF but family and patient want comfort care. CSW spoke with patient's daughter Gwynneth Munson [phone] and she would like patient to [facility]. CSW explained the process for transfer to hospice home to patient's daughter. CSW notified [name] with [facility] of referral and need for hospice home bed.   Employment status:  Retired Nurse, adult PT Recommendations:  [place] / Referral to community resources:  Okay  Patient/Family's Response to care:  Patient unable to assess due to sedation   Patient/Family's Understanding of and Emotional Response to Diagnosis, Current Treatment, and Prognosis:  Patient's daughter is understanding of prognosis and wants comfort care. Per daughter, patient also wants comfort care.    Emotional Assessment Appearance:  Appears stated age Attitude/Demeanor/Rapport:  Unable to Assess Affect (typically observed):  Unable to Assess Orientation:  (Patient unable to communicate at this time ) Alcohol / Substance use:  Not Applicable Psych involvement (Current and /or in the community):  No (Comment)  Discharge Needs  Concerns to be addressed:  Discharge Planning Concerns Readmission within the last 30 days:  No Current discharge risk:  None Barriers to Discharge:  Hospice Bed not available   Annamaria Boots, [place] 06/22/2017, 9:50 AM

## 2017-06-22 NOTE — Progress Notes (Signed)
Patient anxious and daughter requesting meds for anxiety. Called Dr. Marcille Blanco and he ordered Ativan IV prn. See orders.

## 2017-06-22 NOTE — Progress Notes (Signed)
Daily Progress Note   Patient Name: [name]       Date: 06/22/2017 DOB: Nov 05, 1933  Age: 84 y.o. MRN#: [id number] Attending Physician: Nicholes Mango, MD Primary Care Physician: Idelle Crouch, MD Admit Date: 06/18/2017  Reason for Consultation/Follow-up: Establishing goals of care, Non pain symptom management, Pain control and Psychosocial/spiritual support  Subjective: Patient moaning appears uncomfortable.  Tells me she needs and iron.  Family at bedside concerned.  I discussed pain and anxiety control with her daughter [name] at bedside.  We will trial low dose dilaudid as her pain is currently uncontrolled.  [name] and I discussed home with hospice vs hospice house.  She spoke with her husband for a moment and they decided hospice house would be best even though Mrs. Cahoon has stated in the past that she wanted to be at home.  [name] and I talked about her mother quickly becoming hypercarbic when we treat her pain.  I explained that she will go to sleep.  [name] understands and is agreeable.   Assessment:  84 yo female with end stage lung disease and severe back pain from compression fractures.  Patient has significant pain and anxiety.  She becomes quickly hypercarbic.  She refuses bipap.  Her PO intake consists of sips and bites.     She has an outpatient history of benzodiazepine use and has required fairly large amounts of ativan in the hospital.    Patient Profile/HPI:  84 y.o.femalewith past medical history of COPD, lung cancer s/p lobectomy, CAD, compression fracture, falls, and panic attackswho was admitted on5/4/2019with weakness and confusion. Initial work up revealed a Ph of 7.26 and CO2 of 106.She was admitted to the ICU and required bipap.   Length of Stay:  4  Current Medications: Scheduled Meds:  . escitalopram  10 mg Oral BID  . feeding supplement (ENSURE ENLIVE)  237 mL Oral TID BM  . LORazepam  0.5 mg Oral BID  . LORazepam  1 mg Oral QHS  . mouth rinse  15 mL Mouth Rinse BID  . mirtazapine  30 mg Oral QHS  . pantoprazole  40 mg Oral Daily  . sodium chloride flush  3 mL Intravenous Q12H    Continuous Infusions: . sodium chloride      PRN Meds: acetaminophen **OR** acetaminophen, albuterol, antiseptic oral  rinse, glycopyrrolate **OR** glycopyrrolate **OR** glycopyrrolate, haloperidol **OR** haloperidol **OR** haloperidol lactate, HYDROmorphone (DILAUDID) injection, ibuprofen, LORazepam, morphine CONCENTRATE, ondansetron **OR** ondansetron (ZOFRAN) IV, polyvinyl alcohol  Physical Exam       2 of 6   Vital Signs: BP (!) 132/56 (BP Location: Left Arm)   Pulse 94   Temp 98.2 F (36.8 C) (Oral)   Resp (!) 25   Ht 5\' 2"  (1.575 m)   Wt 58.3 kg (128 lb 8.5 oz)   SpO2 91%   BMI 23.51 kg/m  SpO2: SpO2: 91 % O2 Device: O2 Device: Nasal Cannula O2 Flow Rate: O2 Flow Rate (L/min): 5 L/min  Intake/output summary:   Intake/Output Summary (Last 24 hours) at 06/22/2017 3845 Last data filed at 06/22/2017 0900 Gross per 24 hour  Intake 1581.83 ml  Output 1000 ml  Net 581.83 ml   LBM: Last BM Date: 06/20/17 Baseline Weight: Weight: 54.4 kg (120 lb) Most recent weight: Weight: (refused)       Palliative Assessment/Data: 20%      Patient Active Problem List   Diagnosis Date Noted  . Malnutrition of moderate degree 06/20/2017  . Acute respiratory failure with hypoxia and hypercapnia (HCC)   . Palliative care encounter   . Encounter for hospice care discussion   . Goals of care, counseling/discussion   . DNR (do not resuscitate)   . Hypercarbia 06/18/2017  . PNA (pneumonia) 02/22/2017  . Aortic atherosclerosis ([place]) 05/20/2016  . Pulmonary nodules/lesions, multiple 12/18/2014  . Ovarian cyst 07/30/2014  . Nipple discharge  02/15/2014  . Anxiety and depression 07/24/2013  . Arterial vascular disease 07/24/2013  . Insomnia, persistent 07/24/2013  . Acid reflux 07/24/2013  . HLD (hyperlipidemia) 07/24/2013  . Cancer of upper lobe of left lung ([place]) 07/24/2013  . Peripheral blood vessel disorder ([place]) 07/24/2013  . Papilloma of left breast 11/22/2012  . CAFL (chronic airflow limitation) ([place]) 03/09/2012  . ALLERGIC RHINITIS 06/01/2007  . EMPHYSEMA 06/01/2007  . ASTHMA 06/01/2007  . DYSPNEA 06/01/2007    Palliative Care Plan    Recommendations/Plan:  D/C to hospice house when bed available  Treat pain with oral morphine and IV dilaudid  Treat anxiety with scheduled and PRN lorazepam.  Orders changed to reflect full comfort.  Goals of Care and Additional Recommendations:  Limitations on Scope of Treatment: Full Comfort Care  Code Status:  DNR  Prognosis:   < 2 weeks due to hypercarbic respiratory failure and the need for opioid and benzodiazepine therapy.   Discharge Planning:  Hospice facility  Care plan was discussed with CSW, attending, family.  Thank you for allowing the Palliative Medicine Team to assist in the care of this patient.  Total time spent:  25 min.     Greater than 50%  of this time was spent counseling and coordinating care related to the above assessment and plan.  Florentina Jenny, PA-C Palliative Medicine  Please contact Palliative MedicineTeam phone at [phone] for questions and concerns between 7 am - 7 pm.   Please see AMION for individual provider pager numbers.

## 2017-06-22 NOTE — Discharge Summary (Signed)
[facility] NAME: [name]    MR#:  [id number]  DATE OF BIRTH:  Jul 24, 1933  DATE OF ADMISSION:  06/18/2017 ADMITTING PHYSICIAN: Hillary Bow, MD  DATE OF DISCHARGE: 06/22/17 PRIMARY CARE PHYSICIAN: Idelle Crouch, MD    ADMISSION DIAGNOSIS:  Edema [R60.9] Acute respiratory failure with hypoxia and hypercapnia ([place]) [J96.01, J96.02]  DISCHARGE DIAGNOSIS:  Active Problems:   Hypercarbia   Acute respiratory failure with hypoxia and hypercapnia ([place])   Palliative care encounter   Encounter for hospice care discussion   Goals of care, counseling/discussion   DNR (do not resuscitate)   Malnutrition of moderate degree   Comfort measures only status   SECONDARY DIAGNOSIS:   Past Medical History:  Diagnosis Date  . Anxiety   . Cancer ([place])    lung, chemo  . COPD (chronic obstructive pulmonary disease) ([place])   . Coronary artery disease   . GERD (gastroesophageal reflux disease)   . Hyperlipidemia   . Lower extremity edema   . Lung disease   . Myocardial infarction ([place])   . Ovarian cyst 07/30/2014  . Past heart attack    "stress"- per pt has had 2.  . Personal history of chemotherapy   . Shortness of breath     HOSPITAL COURSE:   HPI [name]  is a 84 y.o. female with a known history of CAD, end-stage COPD, anxiety, recent lumbar spine compression fracture presents to the emergency room due to recurrent falls, weakness, confusion.  Patient had lumbar spinal compression fracture 1 month back and was started on oxycodone.  She did not do well with that and was slowly tapered off it.  Started on Klonopin.  Presently her pain is much improved. Patient was brought to the emergency room due to weakness and confusion.  Here her venous blood gas showed pH of 7.26 with PCO2 106.  Patient needs admission and BiPAP support.  COPD exacerbation.  History obtained from reviewing old charts and discussing with family.   Patient unable to contribute to history.  * FTT -seen by palliative care, family requested hospice care at hospice home and change her to DNR comfort care  *Delirium from CO2 narcosis CT head with no acute findings   *Acute on chronic hypercapnic respiratory failure due to COPD exacerbation possible pneumonia  clinically not doing good and is refusing BiPAP,  seen by pulmonology  *Hypomagnesemia repleted and recheck magnesium at 2.2  *Recent compression fracture. Pain management as needed  *CAD.    Disposition to hospice home today.  Discussed with family members patient's daughter granddaughter are at bedside and agreeable with the current plan of care     DISCHARGE CONDITIONS:   guarded  CONSULTS OBTAINED:     PROCEDURES  bipap  DRUG ALLERGIES:   Allergies  Allergen Reactions  . Morphine And Related Other (See Comments)    halluncinations  . Sulfa Antibiotics Itching    redness  . Sulfonamide Derivatives Itching  . Tetanus Toxoid Itching and Swelling    DISCHARGE MEDICATIONS:   Allergies as of 06/22/2017      Reactions   Morphine And Related Other (See Comments)   halluncinations   Sulfa Antibiotics Itching   redness   Sulfonamide Derivatives Itching   Tetanus Toxoid Itching, Swelling      Medication List    STOP taking these medications   ADVAIR DISKUS 250-50 MCG/DOSE Aepb Generic drug:  Fluticasone-Salmeterol   albuterol 108 (90  Base) MCG/ACT inhaler Commonly known as:  PROVENTIL HFA;VENTOLIN HFA Replaced by:  albuterol (2.5 MG/3ML) 0.083% nebulizer solution   atorvastatin 20 MG tablet Commonly known as:  LIPITOR   calcitonin (salmon) 200 UNIT/ACT nasal spray Commonly known as:  MIACALCIN/FORTICAL   CALCIUM 600 + D PO   CLEAR EYES OP   clonazePAM 1 MG tablet Commonly known as:  KLONOPIN   escitalopram 10 MG tablet Commonly known as:  LEXAPRO   furosemide 40 MG tablet Commonly known as:  LASIX   ibuprofen 200 MG  tablet Commonly known as:  ADVIL,MOTRIN   megestrol 400 MG/10ML suspension Commonly known as:  MEGACE   multivitamin with minerals Tabs tablet   omeprazole 40 MG capsule Commonly known as:  PRILOSEC   senna-docusate 8.6-50 MG tablet Commonly known as:  Senokot-S   sodium chloride 0.65 % Soln nasal spray Commonly known as:  OCEAN   SPIRIVA HANDIHALER 18 MCG inhalation capsule Generic drug:  tiotropium     TAKE these medications   acetaminophen 325 MG tablet Commonly known as:  TYLENOL Take 2 tablets (650 mg total) by mouth every 6 (six) hours as needed for mild pain (or Fever >/= 101).   albuterol (2.5 MG/3ML) 0.083% nebulizer solution Commonly known as:  PROVENTIL Take 3 mLs (2.5 mg total) by nebulization every 2 (two) hours as needed for wheezing. Replaces:  albuterol 108 (90 Base) MCG/ACT inhaler   glycopyrrolate 1 MG tablet Commonly known as:  ROBINUL Take 1 tablet (1 mg total) by mouth every 4 (four) hours as needed (excessive secretions).   haloperidol 0.5 MG tablet Commonly known as:  HALDOL Take 1 tablet (0.5 mg total) by mouth every 4 (four) hours as needed for agitation (or delirium).   HYDROmorphone 1 MG/ML injection Commonly known as:  DILAUDID Inject 0.5 mLs (0.5 mg total) into the vein every 2 (two) hours as needed for severe pain (dyspnea, and comfort at end of life.).   LORazepam 0.5 MG tablet Commonly known as:  ATIVAN Take 1 tablet (0.5 mg total) by mouth 2 (two) times daily.   LORazepam 1 MG tablet Commonly known as:  ATIVAN Take 1 tablet (1 mg total) by mouth at bedtime.   LORazepam 2 MG/ML injection Commonly known as:  ATIVAN Inject 0.5 mLs (1 mg total) into the vein every 2 (two) hours as needed for anxiety.   mirtazapine 30 MG tablet Commonly known as:  REMERON Take 1 tablet (30 mg total) by mouth at bedtime. What changed:  when to take this   ondansetron 4 MG tablet Commonly known as:  ZOFRAN Take 1 tablet (4 mg total) by mouth  every 6 (six) hours as needed for nausea.   pantoprazole 40 MG tablet Commonly known as:  PROTONIX Take 1 tablet (40 mg total) by mouth daily. Start taking on:  06/23/2017   polyvinyl alcohol 1.4 % ophthalmic solution Commonly known as:  LIQUIFILM TEARS Place 1 drop into both eyes 4 (four) times daily as needed for dry eyes.        DISCHARGE INSTRUCTIONS:  Transfer patient to hospice home and implement strict comfort care measures   DIET:  Regular diet as tolerated   DISCHARGE CONDITION:  guarded  ACTIVITY:  Bedrest  OXYGEN:  Home Oxygen: Yes.     Oxygen Delivery: 2 liters/min via Patient connected to nasal cannula oxygen  DISCHARGE LOCATION:  Hospice home    If you experience worsening of your admission symptoms, develop shortness of breath, life threatening emergency,  suicidal or homicidal thoughts you must seek medical attention immediately by calling 911 or calling your MD immediately  if symptoms less severe.  You Must read complete instructions/literature along with all the possible adverse reactions/side effects for all the Medicines you take and that have been prescribed to you. Take any new Medicines after you have completely understood and accpet all the possible adverse reactions/side effects.   Please note  You were cared for by a hospitalist during your hospital stay. If you have any questions about your discharge medications or the care you received while you were in the hospital after you are discharged, you can call the unit and asked to speak with the hospitalist on call if the hospitalist that took care of you is not available. Once you are discharged, your primary care physician will handle any further medical issues. Please note that NO REFILLS for any discharge medications will be authorized once you are discharged, as it is imperative that you return to your primary care physician (or establish a relationship with a primary care physician if you do not  have one) for your aftercare needs so that they can reassess your need for medications and monitor your lab values.     Today  Chief Complaint  Patient presents with  . Fall  . Weakness   Patient was made DNR comfort care and resting comfortably with Dilaudid.  Family members at bedside  ROS:  Unobtainable as the patient is delirious  VITAL SIGNS:  Blood pressure (!) 132/56, pulse 94, temperature 98.2 F (36.8 C), temperature source Oral, resp. rate (!) 25, height 5\' 2"  (1.575 m), weight 58.3 kg (128 lb 8.5 oz), SpO2 91 %.  I/O:    Intake/Output Summary (Last 24 hours) at 06/22/2017 1334 Last data filed at 06/22/2017 1200 Gross per 24 hour  Intake 994.83 ml  Output 1000 ml  Net -5.17 ml    PHYSICAL EXAMINATION:  GENERAL:  84 y.o.-year-old patient lying in the bed with no acute distress.  EYES: Pupils equal, round, reactive to light and accommodation. No scleral icterus. Extraocular muscles intact.  HEENT: Head atraumatic, normocephalic. Oropharynx and nasopharynx clear.  NECK:  Supple LUNGS: Diminished breath sounds bilaterally, no wheezing, rales,rhonchi or crepitation. No use of accessory muscles of respiration.  CARDIOVASCULAR: S1, S2 normal. No murmurs, rubs, or gallops.   NEUROLOGIC: Lethargic PSYCHIATRIC: The patient is lethargic     DATA REVIEW:   CBC Recent Labs  Lab 06/20/17 0455  WBC 11.0  HGB 9.2*  HCT 27.2*  PLT 481*    Chemistries  Recent Labs  Lab 06/18/17 1106  06/20/17 0455  NA 131*   < > 139  K 3.5   < > 3.5  CL 84*   < > 94*  CO2 39*   < > 40*  GLUCOSE 144*   < > 87  BUN 15   < > 13  CREATININE 0.77   < > 0.72  CALCIUM 8.5*   < > 7.9*  MG  --    < > 2.2  AST 35  --   --   ALT 30  --   --   ALKPHOS 84  --   --   BILITOT 0.4  --   --    < > = values in this interval not displayed.    Cardiac Enzymes Recent Labs  Lab 06/18/17 1106  TROPONINI 0.05*    Microbiology Results  Results for orders placed or performed during the  hospital encounter of 06/18/17  MRSA PCR Screening     Status: None   Collection Time: 06/18/17  3:05 PM  Result Value Ref Range Status   MRSA by PCR NEGATIVE NEGATIVE Final    Comment:        The GeneXpert MRSA Assay (FDA approved for NASAL specimens only), is one component of a comprehensive MRSA colonization surveillance program. It is not intended to diagnose MRSA infection nor to guide or monitor treatment for MRSA infections. Performed at [facility], [place], [place] [address]     RADIOLOGY:  Ct Head Wo Contrast  Result Date: 06/18/2017 CLINICAL DATA:  Recent fall, altered mental status EXAM: CT HEAD WITHOUT CONTRAST TECHNIQUE: Contiguous axial images were obtained from the base of the skull through the vertex without intravenous contrast. COMPARISON:  None. FINDINGS: Brain: No acute intracranial hemorrhage. No focal mass lesion. No CT evidence of acute infarction. No midline shift or mass effect. No hydrocephalus. Basilar cisterns are patent. Remote deep white matter infarction in the LEFT centrum semiovale. Mild atrophy. Vascular: No hyperdense vessel or unexpected calcification. Skull: Normal. Negative for fracture or focal lesion. Sinuses/Orbits: Paranasal sinuses and mastoid air cells are clear. Orbits are clear. Other: None. IMPRESSION: 1. No acute intracranial findings. 2. No intracranial trauma. 3. Deep white matter infarction on the LEFT. Electronically Signed   By: Suzy Bouchard M.D.   On: 06/18/2017 17:37   US Venous Img Lower Bilateral  Result Date: 06/18/2017 CLINICAL DATA:  Lower extremity edema for 4 weeks EXAM: BILATERAL LOWER EXTREMITY VENOUS DOPPLER ULTRASOUND TECHNIQUE: Gray-scale sonography with graded compression, as well as color Doppler and duplex ultrasound were performed to evaluate the lower extremity deep venous systems from the level of the common femoral vein and including the common femoral, femoral, profunda femoral,  popliteal and calf veins including the posterior tibial, peroneal and gastrocnemius veins when visible. The superficial great saphenous vein was also interrogated. Spectral Doppler was utilized to evaluate flow at rest and with distal augmentation maneuvers in the common femoral, femoral and popliteal veins. COMPARISON:  None. FINDINGS: RIGHT LOWER EXTREMITY Common Femoral Vein: No evidence of thrombus. Normal compressibility, respiratory phasicity and response to augmentation. Saphenofemoral Junction: No evidence of thrombus. Normal compressibility and flow on color Doppler imaging. Profunda Femoral Vein: No evidence of thrombus. Normal compressibility and flow on color Doppler imaging. Femoral Vein: No evidence of thrombus. Normal compressibility, respiratory phasicity and response to augmentation. Popliteal Vein: No evidence of thrombus. Normal compressibility, respiratory phasicity and response to augmentation. Calf Veins: No evidence of thrombus. Normal compressibility and flow on color Doppler imaging. Superficial Great Saphenous Vein: No evidence of thrombus. Normal compressibility. Venous Reflux:  None. Other Findings:  None. LEFT LOWER EXTREMITY Common Femoral Vein: No evidence of thrombus. Normal compressibility, respiratory phasicity and response to augmentation. Saphenofemoral Junction: No evidence of thrombus. Normal compressibility and flow on color Doppler imaging. Profunda Femoral Vein: No evidence of thrombus. Normal compressibility and flow on color Doppler imaging. Femoral Vein: No evidence of thrombus. Normal compressibility, respiratory phasicity and response to augmentation. Popliteal Vein: No evidence of thrombus. Normal compressibility, respiratory phasicity and response to augmentation. Calf Veins: No evidence of thrombus. Normal compressibility and flow on color Doppler imaging. Superficial Great Saphenous Vein: No evidence of thrombus. Normal compressibility. Venous Reflux:  None. Other  Findings: Subcutaneous edema is seen in left greater than right lower extremities. IMPRESSION: No evidence of deep venous thrombosis in either lower extremity. Electronically Signed  By: Ilona Sorrel M.D.   On: 06/18/2017 17:11   Dg Chest Port 1 View  Result Date: 06/19/2017 CLINICAL DATA:  Atelectasis EXAM: PORTABLE CHEST 1 VIEW COMPARISON:  Chest radiograph from one day prior. FINDINGS: Surgical suture line overlies the left lung apex. Stable cardiomediastinal silhouette with normal heart size. No pneumothorax. No pleural effusion. No pulmonary edema. Stable mild scarring at the left lung apex and at the lung bases. No acute consolidative airspace disease. IMPRESSION: Stable chest radiograph with scattered mild scarring and postsurgical changes at the left lung apex. Electronically Signed   By: Ilona Sorrel M.D.   On: 06/19/2017 07:07    EKG:   Orders placed or performed during the hospital encounter of 06/18/17  . ED EKG  . ED EKG  . EKG 12-Lead  . EKG 12-Lead      Management plans discussed with the patient, family and they are in agreement.  CODE STATUS:     Code Status Orders  (From admission, onward)        Start     Ordered   06/22/17 0926  Do not attempt resuscitation (DNR)  Continuous    Question Answer Comment  In the event of cardiac or respiratory ARREST Do not call a "code blue"   In the event of cardiac or respiratory ARREST Do not perform Intubation, CPR, defibrillation or ACLS   In the event of cardiac or respiratory ARREST Use medication by any route, position, wound care, and other measures to relive pain and suffering. May use oxygen, suction and manual treatment of airway obstruction as needed for comfort.   Comments Patient and family are refusion intubation      06/22/17 0927    Code Status History    Date Active Date Inactive Code Status Order ID Comments User Context   06/20/2017 1324 06/22/2017 0927 DNR [id number]  Fuller Canada, PA-C Inpatient    06/19/2017 1143 06/20/2017 1324 Partial Code [id number]  Cassandria Santee, MD Inpatient   06/18/2017 1259 06/19/2017 1143 Full Code [id number]  Hillary Bow, MD ED   02/22/2017 1959 02/24/2017 1459 Full Code [id number]  Nicholes Mango, MD Inpatient    Advance Directive Documentation     Most Recent Value  Type of Advance Directive  Healthcare Power of Attorney  Pre-existing out of facility DNR order (yellow form or pink MOST form)  -  "MOST" Form in Place?  -      TOTAL TIME TAKING CARE OF THIS PATIENT: 36 minutes.   Note: This dictation was prepared with Dragon dictation along with smaller phrase technology. Any transcriptional errors that result from this process are unintentional.   @MEC @  on 06/22/2017 at 1:34 PM  Between 7am to 6pm - Pager - [phone]  After 6pm go to [email or web address] - password [email or web address] [facility]  Office  [phone]  CC: Primary care physician; Idelle Crouch, MD

## 2017-06-22 NOTE — Progress Notes (Signed)
New hospice home referral received from [facility] following a Palliative Medicine consult. Patient is a an 84 year old woman with a known history of end-stage COPD, CAD, anxiety and recent lumbar spine compression fracture admitted to [facility] on 5/4 for evaluation of weakness and confusion. Blood gas showed pH of 7.26 with PCO2 106, she required BIPAP.  She has continued with increased anxiety, pain and dyspnea. Palliative medicine was consulted for goals of care and met with family over the last 2 days. After their meeting this morning family chose to focus on her comfort with discharge to the hospice home. Writer met in the room with patient's daughter [name] and son in law to initiate education regarding hospice services, philosophy and team approach to care with good understanding voiced. Grand daughter Anderson Malta in during visit and also was part of the discussion. Questions answered, consents signed. Patient was symptomatic prior to visit and received PRN medications. Patient remains asleep thorough out the visit and did not participate in the conversation. Patient information faxed to referral. Report called to the hospice home. EMS notified for a 4 pm pick up. Family and hospital care team all updated. Signed DNR in place in discharge packet. Flo Shanks RN, BSN, [facility] [place], hospital liaison [phone]

## 2017-06-22 NOTE — Clinical Social Work Note (Signed)
Hospice Home has a bed available today for patient. CSW notified daughter [name] of this. [name] with Hospice will coordinate the discharge. EMS will transport.   Annamaria Boots MSW, [place] [phone]

## 2017-07-16 DEATH — deceased

## 2018-01-13 IMAGING — CR DG CHEST 2V
1 series · 2 of 2 positions shown · non-contrast
Comparison: Chest CT on 12/03/2014

CLINICAL DATA: Shortness of breath. Emphysema. Previous surgery for
left lung carcinoma.

EXAM:
CHEST  2 VIEW

[Series 1: dg chest 2 view · 0.14mm/px · 2 of 2 slices shown]
[im 1/2]
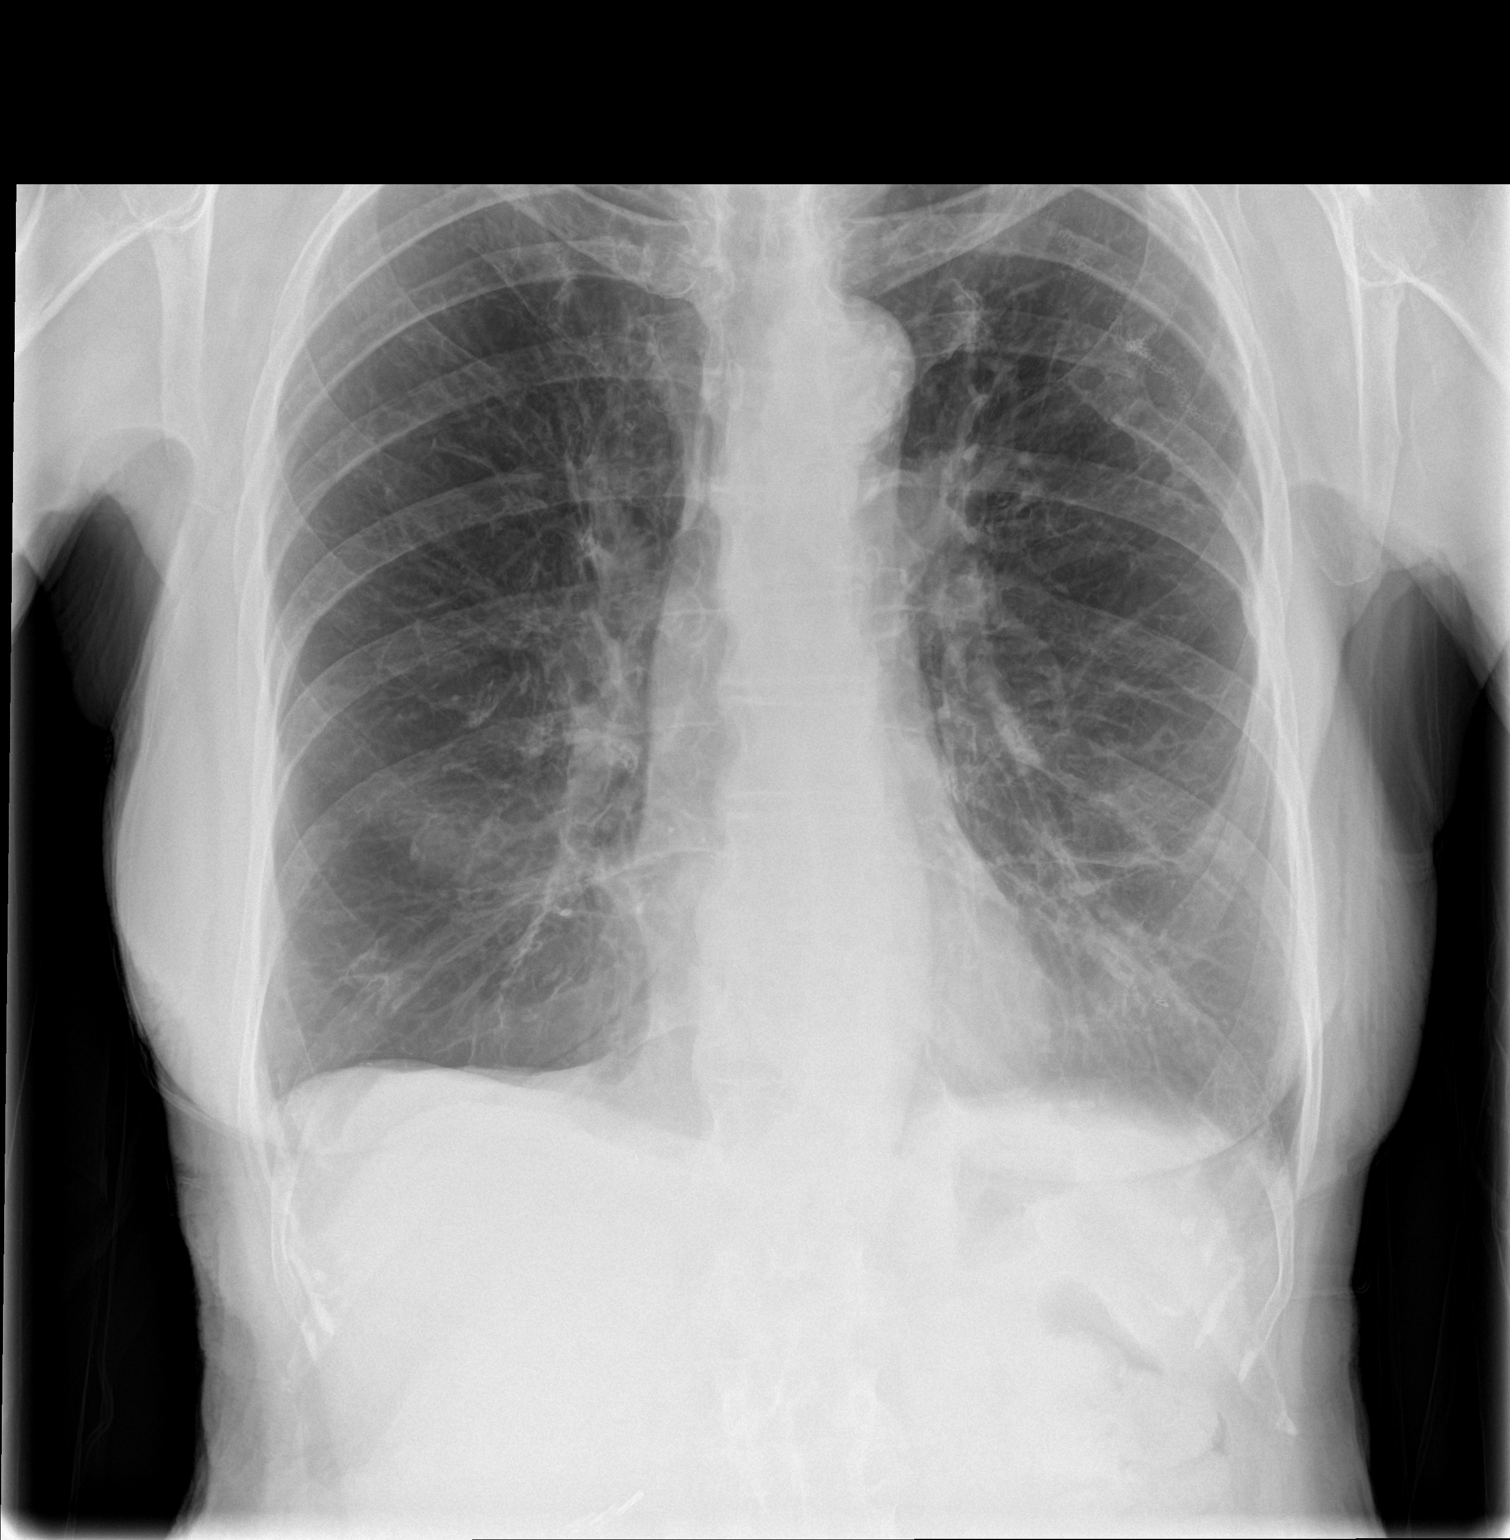
[im 2/2]
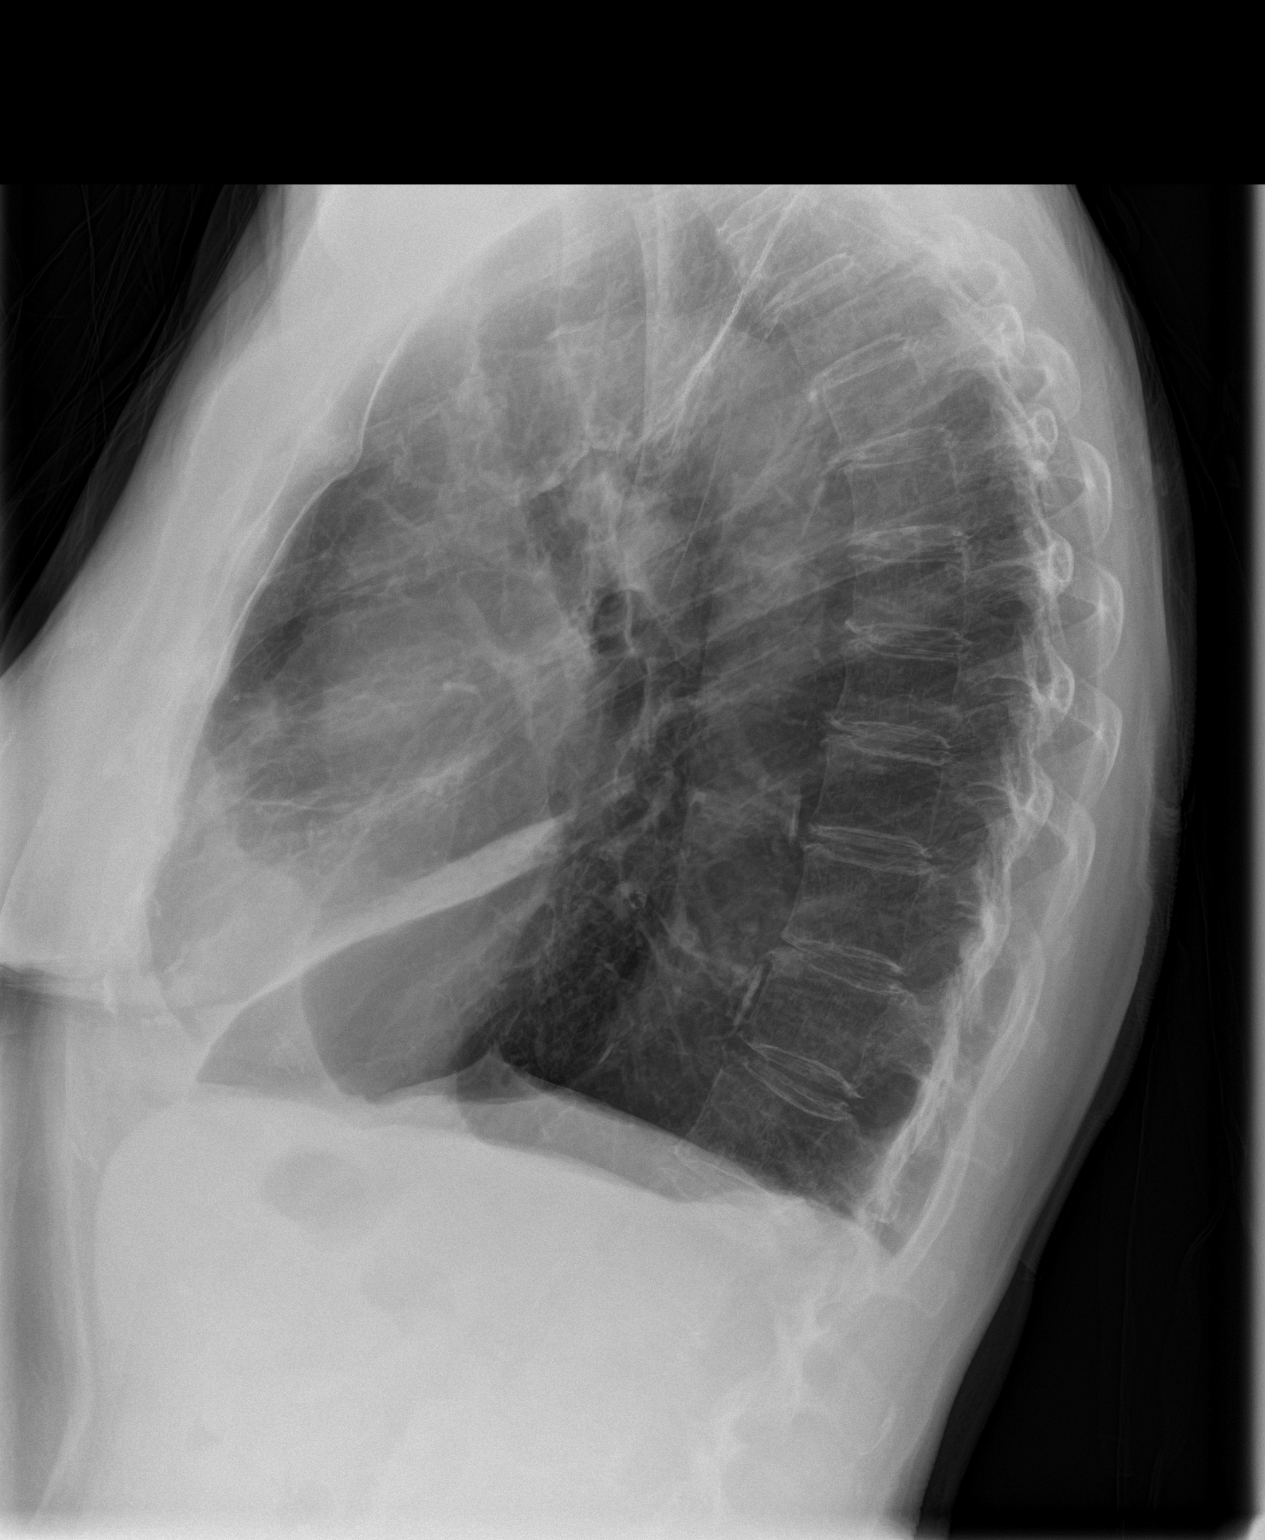

[2 of 2 positions shown; findings below may reference images not displayed]

FINDINGS: The heart size and mediastinal contours are within normal limits.
Pulmonary hyperinflation again seen, consistent COPD. Postop changes
from left thoracotomy are seen with surgical staples in the left
upper lobe. Right middle lobe atelectasis appears stable on lateral
projection. No evidence of pulmonary consolidation or pleural
effusion.
IMPRESSION: COPD.  No acute findings.

Stable right middle lobe atelectasis and postsurgical changes in
left hemithorax.

## 2020-01-23 IMAGING — CR DG CHEST 2V
1 series · 2 of 2 positions shown · non-contrast
Comparison: 02/22/2017

CLINICAL DATA: Pneumonia.

EXAM:
CHEST - 2 VIEW

[Series 1: dg chest 2 view · 0.14mm/px · 2 of 2 slices shown]
[im 1/2]
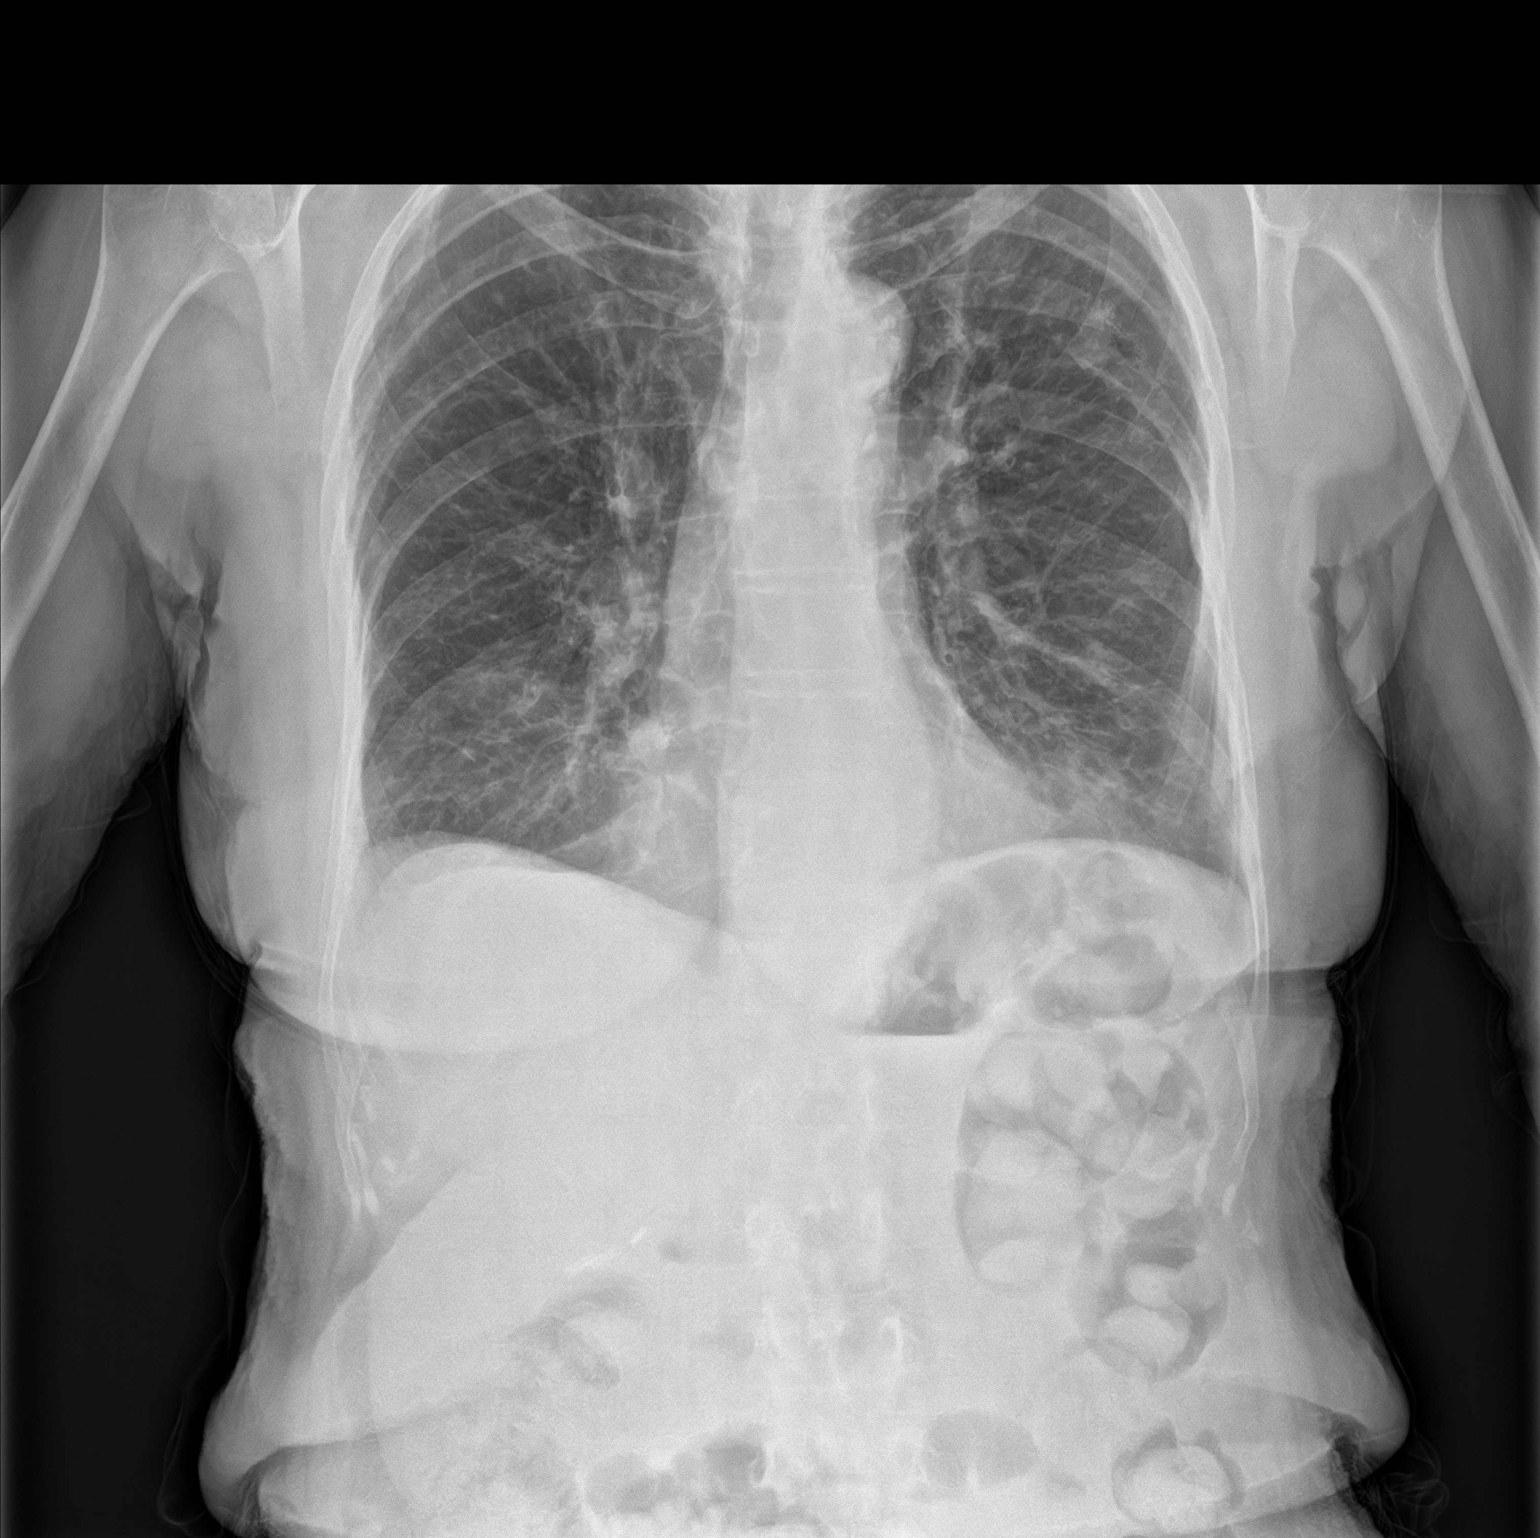
[im 2/2]
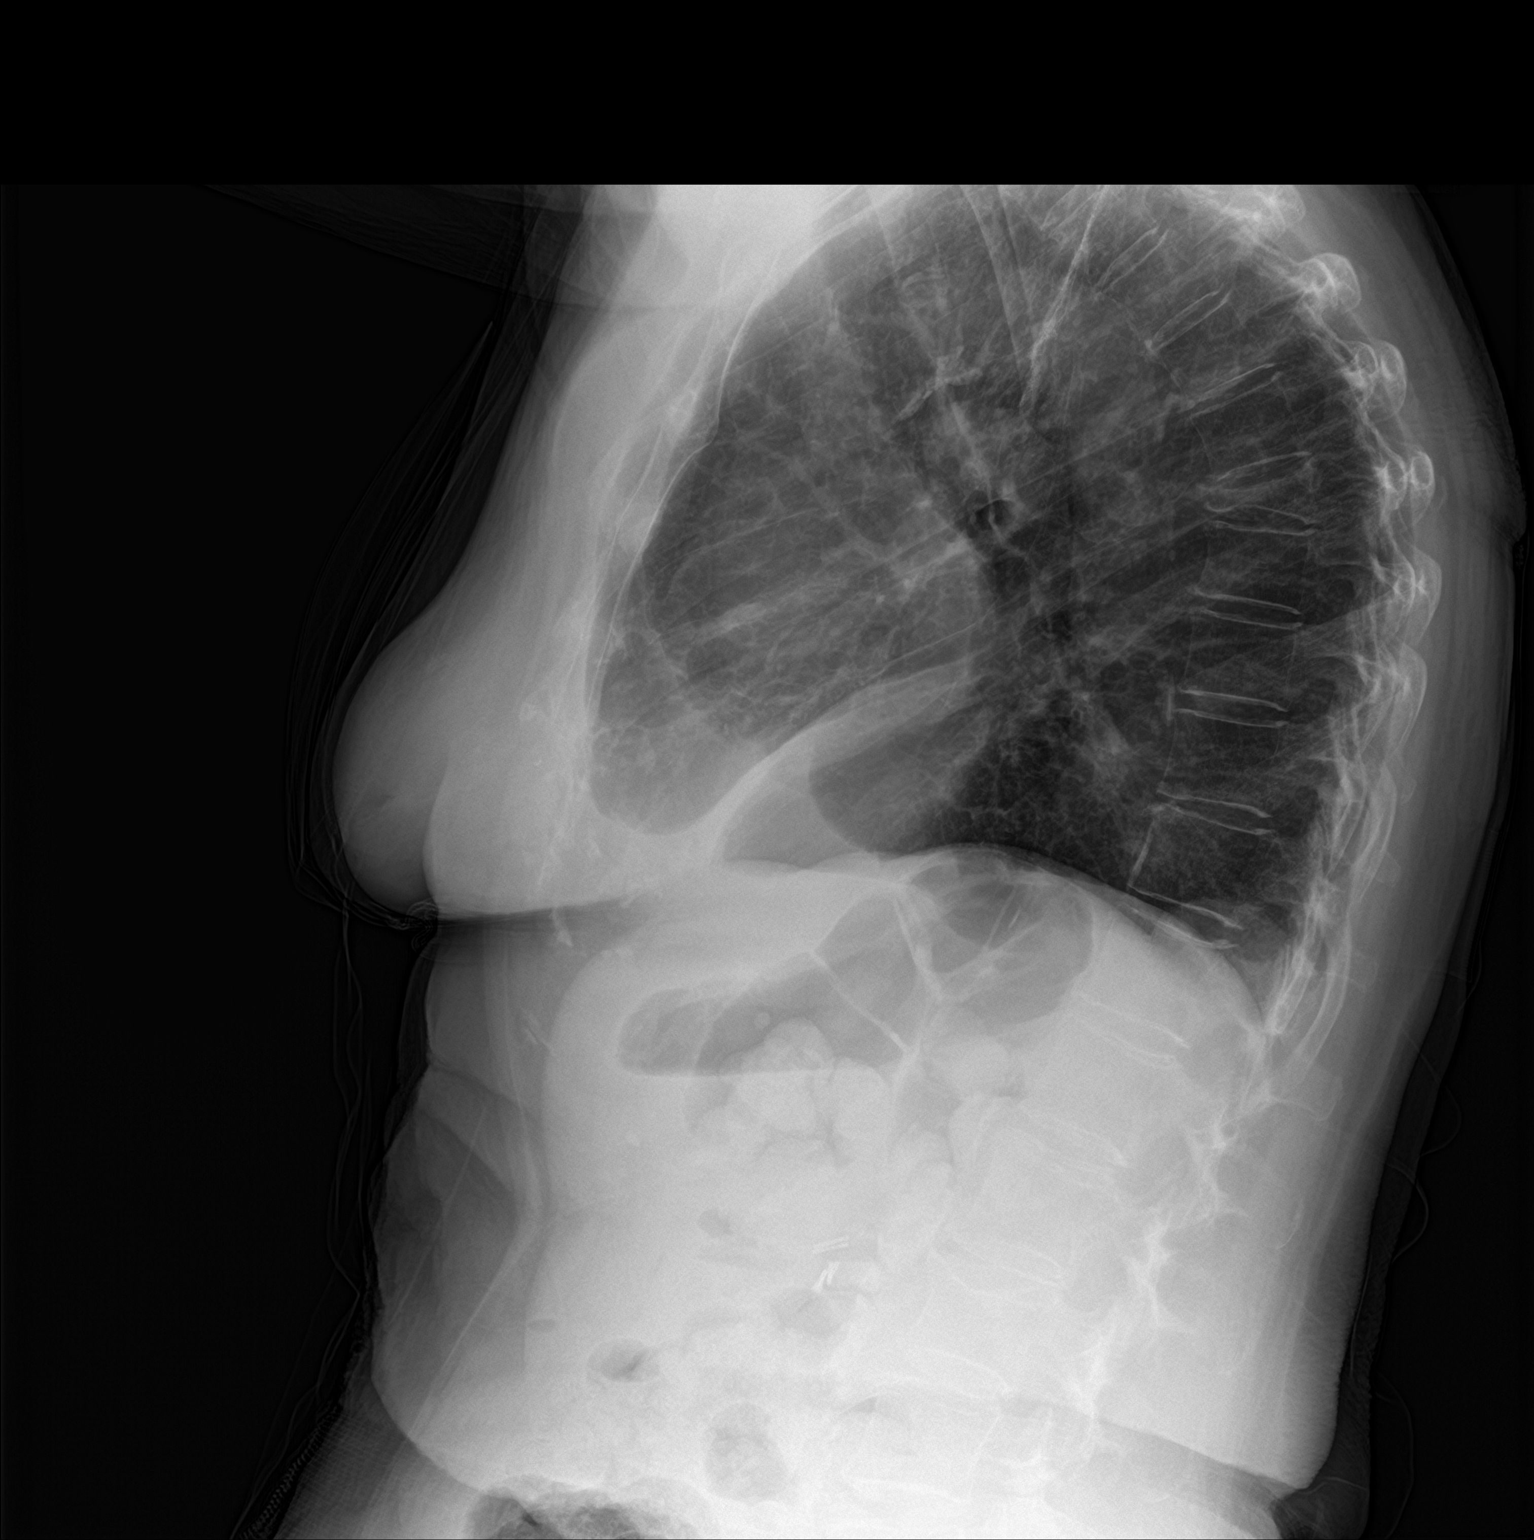

[2 of 2 positions shown; findings below may reference images not displayed]

FINDINGS: The cardiomediastinal silhouette is within normal limits. Aortic
atherosclerosis is noted. Postsurgical changes are again seen in the
left upper lobe. Asymmetric right mid lung and right lung base
opacity on the prior study has resolved. The interstitial markings
are increased diffusely in a symmetric fashion and mildly nodular,
stable to mildly increased in the left lung compared to the prior
study and also overall appearing increased bilaterally comparing to
an older study from 05/09/2015. A small left midlung nodule is
unchanged. No pleural effusion or pneumothorax is identified. A
thoracic compression fracture was evaluated in detail on a
05/12/2017 thoracic spine MRI. Right upper quadrant abdominal
surgical clips are noted.
IMPRESSION: 1. Resolution of asymmetric right lung opacity from the prior study.
2. Diffusely increased and mildly nodular interstitial markings
bilaterally in a symmetric fashion, at least partly related to
underlying COPD though superimposed atypical infection is a
consideration given the worsening from 1332.
